# Patient Record
Sex: Male | Born: 1960 | Race: White | Hispanic: No | Marital: Single | Smoking: Former smoker
Health system: Southern US, Community
[De-identification: ages and names within clinical notes are randomized; demographics above are authoritative.]

## PROBLEM LIST (undated history)

## (undated) DIAGNOSIS — E119 Type 2 diabetes mellitus without complications: Secondary | ICD-10-CM

## (undated) DIAGNOSIS — Z972 Presence of dental prosthetic device (complete) (partial): Secondary | ICD-10-CM

## (undated) DIAGNOSIS — M1990 Unspecified osteoarthritis, unspecified site: Secondary | ICD-10-CM

## (undated) DIAGNOSIS — K219 Gastro-esophageal reflux disease without esophagitis: Secondary | ICD-10-CM

## (undated) DIAGNOSIS — M549 Dorsalgia, unspecified: Secondary | ICD-10-CM

## (undated) DIAGNOSIS — J45909 Unspecified asthma, uncomplicated: Secondary | ICD-10-CM

## (undated) HISTORY — DX: Gastro-esophageal reflux disease without esophagitis: K21.9

## (undated) HISTORY — DX: Unspecified osteoarthritis, unspecified site: M19.90

## (undated) HISTORY — DX: Unspecified asthma, uncomplicated: J45.909

## (undated) HISTORY — DX: Dorsalgia, unspecified: M54.9

---

## 2005-07-20 HISTORY — PX: TOTAL HIP ARTHROPLASTY: SHX124

## 2006-02-15 ENCOUNTER — Inpatient Hospital Stay: Payer: Self-pay | Admitting: Specialist

## 2006-12-20 ENCOUNTER — Ambulatory Visit: Payer: Self-pay | Admitting: Specialist

## 2007-01-03 ENCOUNTER — Inpatient Hospital Stay: Payer: Self-pay | Admitting: Specialist

## 2012-01-20 ENCOUNTER — Ambulatory Visit: Payer: Self-pay | Admitting: Internal Medicine

## 2012-10-11 ENCOUNTER — Ambulatory Visit: Payer: Self-pay | Admitting: Family Medicine

## 2015-01-10 ENCOUNTER — Ambulatory Visit: Payer: Self-pay | Admitting: Family Medicine

## 2015-01-10 ENCOUNTER — Other Ambulatory Visit: Payer: Self-pay | Admitting: Family Medicine

## 2015-01-15 ENCOUNTER — Encounter: Payer: Self-pay | Admitting: Family Medicine

## 2015-01-15 ENCOUNTER — Ambulatory Visit (INDEPENDENT_AMBULATORY_CARE_PROVIDER_SITE_OTHER): Payer: Commercial Indemnity | Admitting: Family Medicine

## 2015-01-15 VITALS — BP 107/68 | HR 73 | Resp 16 | Ht 70.25 in | Wt 263.6 lb

## 2015-01-15 DIAGNOSIS — I4949 Other premature depolarization: Secondary | ICD-10-CM | POA: Insufficient documentation

## 2015-01-15 DIAGNOSIS — J4520 Mild intermittent asthma, uncomplicated: Secondary | ICD-10-CM | POA: Insufficient documentation

## 2015-01-15 DIAGNOSIS — J449 Chronic obstructive pulmonary disease, unspecified: Secondary | ICD-10-CM | POA: Insufficient documentation

## 2015-01-15 DIAGNOSIS — E119 Type 2 diabetes mellitus without complications: Secondary | ICD-10-CM

## 2015-01-15 DIAGNOSIS — J45909 Unspecified asthma, uncomplicated: Secondary | ICD-10-CM | POA: Insufficient documentation

## 2015-01-15 DIAGNOSIS — F419 Anxiety disorder, unspecified: Secondary | ICD-10-CM

## 2015-01-15 DIAGNOSIS — N529 Male erectile dysfunction, unspecified: Secondary | ICD-10-CM | POA: Insufficient documentation

## 2015-01-15 DIAGNOSIS — K219 Gastro-esophageal reflux disease without esophagitis: Secondary | ICD-10-CM | POA: Insufficient documentation

## 2015-01-15 DIAGNOSIS — M1990 Unspecified osteoarthritis, unspecified site: Secondary | ICD-10-CM | POA: Insufficient documentation

## 2015-01-15 DIAGNOSIS — E1165 Type 2 diabetes mellitus with hyperglycemia: Secondary | ICD-10-CM | POA: Insufficient documentation

## 2015-01-15 DIAGNOSIS — E1169 Type 2 diabetes mellitus with other specified complication: Secondary | ICD-10-CM | POA: Insufficient documentation

## 2015-01-15 DIAGNOSIS — M549 Dorsalgia, unspecified: Secondary | ICD-10-CM | POA: Insufficient documentation

## 2015-01-15 DIAGNOSIS — E785 Hyperlipidemia, unspecified: Secondary | ICD-10-CM

## 2015-01-15 LAB — POCT GLYCOSYLATED HEMOGLOBIN (HGB A1C): Hemoglobin A1C: 7.6

## 2015-01-15 MED ORDER — METFORMIN HCL 1000 MG PO TABS: 1000.0000 mg | ORAL_TABLET | Freq: Two times a day (BID) | ORAL | Status: DC

## 2015-01-15 MED ORDER — GLUCOSE BLOOD VI STRP: ORAL_STRIP | Status: AC

## 2015-01-15 NOTE — Progress Notes (Signed)
Name: Shawn Gordon   MRN: [id number]    DOB: Nov 20, 1960   Date:01/15/2015       Progress Note  Subjective  Chief Complaint  Chief Complaint  Patient presents with  . Diabetes    Has not had strips to check BS. Last A1C 08/27/2014 7.8  . Foot Swelling    Left Foot more than Right.     HPI  For f/u of DM.  Not checking BSs.  Weight going down slowly.  Taking all meds..  Past Medical History  Diagnosis Date  . GERD (gastroesophageal reflux disease)   . Asthma   . Depression   . Arthritis   . Back pain   . Diabetes mellitus without complication   . Hyperlipidemia     History reviewed. No pertinent past surgical history.  Family History  Problem Relation Age of Onset  . Stroke Mother   . Stroke Father   . Cancer Father   . Hypertension Paternal Grandfather     History   Social History  . Marital Status: Divorced    Spouse Name: N/A  . Number of Children: N/A  . Years of Education: N/A   Occupational History  . Not on file.   Social History Main Topics  . Smoking status: Former Smoker -- 2.00 packs/day    Types: Cigarettes    Start date: 04/19/1978    Quit date: 02/26/2001  . Smokeless tobacco: Never Used  . Alcohol Use: No  . Drug Use: No  . Sexual Activity: Not on file   Other Topics Concern  . Not on file   Social History Narrative  . No narrative on file     Current outpatient prescriptions:  .  atorvastatin (LIPITOR) 10 MG tablet, Take by mouth., Disp: , Rfl:  .  FLUoxetine (PROZAC) 40 MG capsule, TAKE ONE CAPSULE BY MOUTH EVERY DAY, Disp: 30 capsule, Rfl: 5 .  glucose blood (TRUETEST TEST) test strip, Check Blood Sugar 1-2 Times daily., Disp: 100 each, Rfl: 12 .  lisinopril (PRINIVIL,ZESTRIL) 5 MG tablet, Take 2.5 mg by mouth daily., Disp: , Rfl:  .  loratadine (CLARITIN) 10 MG tablet, Take by mouth., Disp: , Rfl:  .  meloxicam (MOBIC) 15 MG tablet, Take 7.5 mg by mouth daily., Disp: , Rfl: 6 .  pantoprazole (PROTONIX) 40 MG tablet, Take  by mouth., Disp: , Rfl:  .  sildenafil (REVATIO) 20 MG tablet, Take by mouth., Disp: , Rfl:  .  amoxicillin (AMOXIL) 500 MG tablet, Take 500 mg by mouth daily at 6 (six) AM. TAKE 4 CAPS at [place]., Disp: , Rfl:  .  metFORMIN (GLUCOPHAGE) 1000 MG tablet, Take 1 tablet (1,000 mg total) by mouth 2 (two) times daily with a meal., Disp: 180 tablet, Rfl: 3  No Known Allergies   Review of Systems  Constitutional: Negative.  Negative for fever, chills, weight loss and malaise/fatigue.  HENT: Negative.   Eyes: Negative.  Negative for blurred vision and double vision.  Respiratory: Negative.  Negative for cough, sputum production, shortness of breath and wheezing.   Cardiovascular: Negative for chest pain, palpitations and orthopnea. Leg swelling: +/- swelling at end of day..  Gastrointestinal: Negative.  Negative for heartburn, nausea, vomiting, abdominal pain, diarrhea and blood in stool.  Genitourinary: Negative.  Negative for dysuria and frequency.  Musculoskeletal: Negative for myalgias and joint pain.       C/o some Achilles tendon tenderness and tightness, on and off.  Skin: Negative.  Negative for rash.  Neurological: Negative for dizziness, sensory change, focal weakness, weakness and headaches.  Endo/Heme/Allergies: Negative for polydipsia.  Psychiatric/Behavioral: Negative for depression. The patient is not nervous/anxious.       Objective  Filed Vitals:   01/15/15 1637  BP: 107/68  Pulse: 73  Resp: 16  Height: 5' 10.25" (1.784 m)  Weight: 263 lb 9.6 oz (119.568 kg)    Physical Exam  Constitutional: He is oriented to person, place, and time and well-developed, well-nourished, and in no distress. No distress.  HENT:  Head: Normocephalic and atraumatic.  Eyes: Conjunctivae are normal. Pupils are equal, round, and reactive to light. No scleral icterus.  Neck: Normal range of motion. Neck supple. No thyromegaly present.  Cardiovascular: Normal  rate, regular rhythm, normal heart sounds and intact distal pulses.  Exam reveals no gallop and no friction rub.   No murmur heard. Pulmonary/Chest: Effort normal and breath sounds normal. No respiratory distress. He has no wheezes. He has no rales.  Abdominal: Soft. Bowel sounds are normal. He exhibits no mass. There is no tenderness.  Musculoskeletal: He exhibits no edema.  Lymphadenopathy:    He has no cervical adenopathy.  Neurological: He is alert and oriented to person, place, and time.  Psychiatric: Mood, memory, affect and judgment normal.  Vitals reviewed.        Assessment & Plan  Problem List Items Addressed This Visit      Other   Acute anxiety    Other Visit Diagnoses    Type 2 diabetes mellitus without complication    -  Primary    Relevant Medications    atorvastatin (LIPITOR) 10 MG tablet    glucose blood (TRUETEST TEST) test strip    lisinopril (PRINIVIL,ZESTRIL) 5 MG tablet    metFORMIN (GLUCOPHAGE) 1000 MG tablet    Other Relevant Orders    POCT HgB A1C       Meds ordered this encounter  Medications  . atorvastatin (LIPITOR) 10 MG tablet    Sig: Take by mouth.  . DISCONTD: glucose blood (ONETOUCH VERIO) test strip    Sig: 2 strips daily.  Marland Kitchen DISCONTD: lisinopril (PRINIVIL,ZESTRIL) 2.5 MG tablet    Sig: Take 1 tablet by mouth.  . DISCONTD: lisinopril (PRINIVIL,ZESTRIL) 5 MG tablet    Sig: Take by mouth.  . loratadine (CLARITIN) 10 MG tablet    Sig: Take by mouth.  . DISCONTD: meloxicam (MOBIC) 15 MG tablet    Sig: Take by mouth.  . DISCONTD: metFORMIN (GLUCOPHAGE) 500 MG tablet    Sig: Take by mouth.  . pantoprazole (PROTONIX) 40 MG tablet    Sig: Take by mouth.  . sildenafil (REVATIO) 20 MG tablet    Sig: Take by mouth.  . meloxicam (MOBIC) 15 MG tablet    Sig: Take 7.5 mg by mouth daily.    Refill:  6  . DISCONTD: metFORMIN (GLUCOPHAGE) 500 MG tablet    Sig: TAKE 2 TABLETS EVERY MORNING AND 1 TABLET EACH EVENING    Refill:  12  .  glucose blood (TRUETEST TEST) test strip    Sig: Check Blood Sugar 1-2 Times daily.    Dispense:  100 each    Refill:  12  . lisinopril (PRINIVIL,ZESTRIL) 5 MG tablet    Sig: Take 2.5 mg by mouth daily.  Marland Kitchen amoxicillin (AMOXIL) 500 MG tablet    Sig: Take 500 mg by mouth daily at 6 (six) AM. TAKE 4 CAPS at [place]  PRIOR TO DENTAL PROCEDURE.  . metFORMIN (GLUCOPHAGE) 1000 MG tablet    Sig: Take 1 tablet (1,000 mg total) by mouth 2 (two) times daily with a meal.    Dispense:  180 tablet    Refill:  3   1. Type 2 diabetes mellitus without complication  - POCT HgB A1C - glucose blood (TRUETEST TEST) test strip; Check Blood Sugar 1-2 Times daily.  Dispense: 100 each; Refill: 12 - metFORMIN (GLUCOPHAGE) 1000 MG tablet; Take 1 tablet (1,000 mg total) by mouth 2 (two) times daily with a meal.  Dispense: 180 tablet; Refill: 3  2. Acute anxiety -Continue ProzaC FOR NOW.

## 2015-08-22 ENCOUNTER — Other Ambulatory Visit: Payer: Self-pay | Admitting: Family Medicine

## 2015-09-02 ENCOUNTER — Encounter: Payer: Self-pay | Admitting: Family Medicine

## 2015-09-02 ENCOUNTER — Ambulatory Visit (INDEPENDENT_AMBULATORY_CARE_PROVIDER_SITE_OTHER): Payer: Managed Care, Other (non HMO) | Admitting: Family Medicine

## 2015-09-02 VITALS — BP 109/72 | HR 73 | Temp 97.6°F | Resp 16 | Ht 70.5 in | Wt 268.4 lb

## 2015-09-02 DIAGNOSIS — Z23 Encounter for immunization: Secondary | ICD-10-CM | POA: Diagnosis not present

## 2015-09-02 DIAGNOSIS — E119 Type 2 diabetes mellitus without complications: Secondary | ICD-10-CM | POA: Diagnosis not present

## 2015-09-02 LAB — POCT GLYCOSYLATED HEMOGLOBIN (HGB A1C): Hemoglobin A1C: 7.2

## 2015-09-02 MED ORDER — SITAGLIPTIN PHOSPHATE 100 MG PO TABS: 100.0000 mg | ORAL_TABLET | Freq: Every day | ORAL | Status: DC

## 2015-09-02 NOTE — Progress Notes (Signed)
Name: Shawn Gordon   MRN: [id number]    DOB: 1961/01/14   Date:09/02/2015       Progress Note  Subjective  Chief Complaint  Chief Complaint  Patient presents with  . Diabetes    120-140     HPI Here for f/u of DM.  BSs run from 130-150 usually.  He is trying to eat better,  But still gaining weight.  Feels well.  No c/o.  No problem-specific assessment & plan notes found for this encounter.   Past Medical History  Diagnosis Date  . GERD (gastroesophageal reflux disease)   . Asthma   . Depression   . Arthritis   . Back pain   . Diabetes mellitus without complication ([place])   . Hyperlipidemia     History reviewed. No pertinent past surgical history.  Family History  Problem Relation Age of Onset  . Stroke Mother   . Stroke Father   . Cancer Father   . Hypertension Paternal Grandfather     Social History   Social History  . Marital Status: Divorced    Spouse Name: N/A  . Number of Children: N/A  . Years of Education: N/A   Occupational History  . Not on file.   Social History Main Topics  . Smoking status: Former Smoker -- 2.00 packs/day    Types: Cigarettes    Start date: 04/19/1978    Quit date: 02/26/2001  . Smokeless tobacco: Never Used  . Alcohol Use: No  . Drug Use: No  . Sexual Activity: Not on file   Other Topics Concern  . Not on file   Social History Narrative     Current outpatient prescriptions:  .  atorvastatin (LIPITOR) 10 MG tablet, Take by mouth., Disp: , Rfl:  .  FLUoxetine (PROZAC) 40 MG capsule, TAKE 1 CAPSULE BY MOUTH EVERY DAY, Disp: 30 capsule, Rfl: 5 .  glucose blood (TRUETEST TEST) test strip, Check Blood Sugar 1-2 Times daily., Disp: 100 each, Rfl: 12 .  lisinopril (PRINIVIL,ZESTRIL) 2.5 MG tablet, Take 2.5 mg by mouth daily., Disp: , Rfl: 12 .  loratadine (CLARITIN) 10 MG tablet, Take by mouth., Disp: , Rfl:  .  meloxicam (MOBIC) 15 MG tablet, Take 7.5 mg by mouth daily. 1/2 TAB, Disp: , Rfl: 6 .  metFORMIN  (GLUCOPHAGE) 1000 MG tablet, Take 1 tablet (1,000 mg total) by mouth 2 (two) times daily with a meal., Disp: 180 tablet, Rfl: 3 .  pantoprazole (PROTONIX) 40 MG tablet, Take by mouth., Disp: , Rfl:  .  sitaGLIPtin (JANUVIA) 100 MG tablet, Take 1 tablet (100 mg total) by mouth daily., Disp: 30 tablet, Rfl: 6  Not on File   Review of Systems  Constitutional: Negative for fever, chills, weight loss and malaise/fatigue.  HENT: Negative for hearing loss.   Eyes: Negative for blurred vision and double vision.  Respiratory: Negative for cough, shortness of breath and wheezing.   Cardiovascular: Negative for chest pain, palpitations and leg swelling.  Gastrointestinal: Positive for heartburn. Negative for nausea, vomiting, abdominal pain, diarrhea and blood in stool.  Genitourinary: Negative for dysuria, urgency and frequency.  Musculoskeletal: Negative for myalgias and joint pain.  Skin: Negative for rash.  Neurological: Negative for dizziness, tremors, weakness and headaches.      Objective  Filed Vitals:   09/02/15 1035  BP: 109/72  Pulse: 73  Temp: 97.6 F (36.4 C)  Resp: 16  Height: 5' 10.5" (1.791 m)  Weight: 268 lb 6.4 oz (121.745 kg)  Physical Exam  Constitutional: He is well-developed, well-nourished, and in no distress. No distress.  HENT:  Head: Normocephalic and atraumatic.  Eyes: Conjunctivae are normal. Pupils are equal, round, and reactive to light. No scleral icterus.  Neck: Normal range of motion. Neck supple. Carotid bruit is not present. No thyromegaly present.  Cardiovascular: Normal rate, regular rhythm and normal heart sounds.  Exam reveals no gallop and no friction rub.   No murmur heard. Pulmonary/Chest: Effort normal and breath sounds normal. No respiratory distress. He has no wheezes. He exhibits no tenderness.  Abdominal: Bowel sounds are normal. He exhibits no distension and no mass. There is no tenderness.  Musculoskeletal: He exhibits no edema.   Lymphadenopathy:    He has no cervical adenopathy.  Neurological: He is alert.  Vitals reviewed.      No results found for this or any previous visit (from the past 2160 hour(s)).   Assessment & Plan  Problem List Items Addressed This Visit    None    Visit Diagnoses    Type 2 diabetes mellitus without complication, unspecified long term insulin use status (HCC)    -  Primary    Relevant Medications    lisinopril (PRINIVIL,ZESTRIL) 2.5 MG tablet    sitaGLIPtin (JANUVIA) 100 MG tablet    Other Relevant Orders    POCT HgB A1C    Need for influenza vaccination        Relevant Orders    Flu Vaccine QUAD 36+ mos PF IM (Fluarix & Fluzone Quad PF) (Completed)       Meds ordered this encounter  Medications  . lisinopril (PRINIVIL,ZESTRIL) 2.5 MG tablet    Sig: Take 2.5 mg by mouth daily.    Refill:  12  . sitaGLIPtin (JANUVIA) 100 MG tablet    Sig: Take 1 tablet (100 mg total) by mouth daily.    Dispense:  30 tablet    Refill:  6   1. Type 2 diabetes mellitus without complication, unspecified long term insulin use status (HCC) Cont metformin and Lisinopril - POCT HgB A1C-7.2 - sitaGLIPtin (JANUVIA) 100 MG tablet; Take 1 tablet (100 mg total) by mouth daily.  Dispense: 30 tablet; Refill: 6  2. Need for influenza vaccination  - Flu Vaccine QUAD 36+ mos PF IM (Fluarix & Fluzone Quad PF)

## 2015-09-23 ENCOUNTER — Other Ambulatory Visit: Payer: Self-pay | Admitting: Family Medicine

## 2015-10-24 ENCOUNTER — Other Ambulatory Visit: Payer: Self-pay | Admitting: Family Medicine

## 2015-12-09 ENCOUNTER — Ambulatory Visit: Payer: Commercial Indemnity | Admitting: Family Medicine

## 2016-01-14 ENCOUNTER — Encounter: Payer: Self-pay | Admitting: Family Medicine

## 2016-01-14 ENCOUNTER — Other Ambulatory Visit: Payer: Self-pay | Admitting: Family Medicine

## 2016-01-14 ENCOUNTER — Ambulatory Visit (INDEPENDENT_AMBULATORY_CARE_PROVIDER_SITE_OTHER): Payer: Managed Care, Other (non HMO) | Admitting: Family Medicine

## 2016-01-14 VITALS — BP 119/75 | HR 75 | Temp 97.5°F | Resp 16 | Ht 70.5 in | Wt 266.0 lb

## 2016-01-14 DIAGNOSIS — E669 Obesity, unspecified: Secondary | ICD-10-CM | POA: Diagnosis not present

## 2016-01-14 DIAGNOSIS — K219 Gastro-esophageal reflux disease without esophagitis: Secondary | ICD-10-CM

## 2016-01-14 DIAGNOSIS — E66811 Obesity, class 1: Secondary | ICD-10-CM | POA: Insufficient documentation

## 2016-01-14 DIAGNOSIS — E119 Type 2 diabetes mellitus without complications: Secondary | ICD-10-CM | POA: Diagnosis not present

## 2016-01-14 DIAGNOSIS — E7800 Pure hypercholesterolemia, unspecified: Secondary | ICD-10-CM | POA: Diagnosis not present

## 2016-01-14 DIAGNOSIS — F329 Major depressive disorder, single episode, unspecified: Secondary | ICD-10-CM | POA: Diagnosis not present

## 2016-01-14 DIAGNOSIS — F32A Depression, unspecified: Secondary | ICD-10-CM

## 2016-01-14 DIAGNOSIS — E1169 Type 2 diabetes mellitus with other specified complication: Secondary | ICD-10-CM

## 2016-01-14 LAB — CBC WITH DIFFERENTIAL/PLATELET
BASOS PCT: 0 %
Basophils Absolute: 0 cells/uL (ref 0–200)
EOS PCT: 6 %
Eosinophils Absolute: 426 cells/uL (ref 15–500)
HEMATOCRIT: 39.5 % (ref 38.5–50.0)
Hemoglobin: 13 g/dL — ABNORMAL LOW (ref 13.2–17.1)
LYMPHS PCT: 16 %
Lymphs Abs: 1136 cells/uL (ref 850–3900)
MCH: 27.9 pg (ref 27.0–33.0)
MCHC: 32.9 g/dL (ref 32.0–36.0)
MCV: 84.8 fL (ref 80.0–100.0)
MONO ABS: 497 {cells}/uL (ref 200–950)
MPV: 10.4 fL (ref 7.5–12.5)
Monocytes Relative: 7 %
Neutro Abs: 5041 cells/uL (ref 1500–7800)
Neutrophils Relative %: 71 %
PLATELETS: 311 10*3/uL (ref 140–400)
RBC: 4.66 MIL/uL (ref 4.20–5.80)
RDW: 13.6 % (ref 11.0–15.0)
WBC: 7.1 10*3/uL (ref 3.8–10.8)

## 2016-01-14 LAB — POCT GLYCOSYLATED HEMOGLOBIN (HGB A1C)

## 2016-01-14 MED ORDER — SAXAGLIPTIN HCL 5 MG PO TABS: 5.0000 mg | ORAL_TABLET | Freq: Every day | ORAL | Status: DC

## 2016-01-14 MED ORDER — LINAGLIPTIN 5 MG PO TABS: 5.0000 mg | ORAL_TABLET | Freq: Every day | ORAL | Status: DC

## 2016-01-14 NOTE — Progress Notes (Signed)
Name: Shawn Gordon   MRN: [id number]    DOB: July 10, 1961   Date:01/14/2016       Progress Note  Subjective  Chief Complaint  Chief Complaint  Patient presents with  . Diabetes    HPI Here for f/u of DM .  He did not start Januvia at last visit because insurance denied.  He did not inform me of this.  Still just taking Metformin.  He has lost minimal weight.  Depression is doing v ery well overall.  GERD doing great.  Hed is not checking his blood sugars at home. No problem-specific assessment & plan notes found for this encounter.   Past Medical History  Diagnosis Date  . GERD (gastroesophageal reflux disease)   . Asthma   . Depression   . Arthritis   . Back pain   . Diabetes mellitus without complication ([place])   . Hyperlipidemia     History reviewed. No pertinent past surgical history.  Family History  Problem Relation Age of Onset  . Stroke Mother   . Stroke Father   . Cancer Father   . Hypertension Paternal Grandfather     Social History   Social History  . Marital Status: Divorced    Spouse Name: N/A  . Number of Children: N/A  . Years of Education: N/A   Occupational History  . Not on file.   Social History Main Topics  . Smoking status: Former Smoker -- 2.00 packs/day    Types: Cigarettes    Start date: 04/19/1978    Quit date: 02/26/2001  . Smokeless tobacco: Never Used  . Alcohol Use: No  . Drug Use: No  . Sexual Activity: Not on file   Other Topics Concern  . Not on file   Social History Narrative     Current outpatient prescriptions:  .  atorvastatin (LIPITOR) 10 MG tablet, TAKE 1 TABLET EACH NIGHT AT BEDTIME, Disp: 30 tablet, Rfl: 11 .  FLUoxetine (PROZAC) 40 MG capsule, TAKE 1 CAPSULE BY MOUTH EVERY DAY, Disp: 30 capsule, Rfl: 5 .  glucose blood (TRUETEST TEST) test strip, Check Blood Sugar 1-2 Times daily., Disp: 100 each, Rfl: 12 .  lisinopril (PRINIVIL,ZESTRIL) 2.5 MG tablet, TAKE 1 TABLET ONCE DAILY, Disp: 30 tablet, Rfl: 11 .   loratadine (CLARITIN) 10 MG tablet, Take 10 mg by mouth daily as needed. , Disp: , Rfl:  .  metFORMIN (GLUCOPHAGE) 1000 MG tablet, Take 1 tablet (1,000 mg total) by mouth 2 (two) times daily with a meal., Disp: 180 tablet, Rfl: 3 .  pantoprazole (PROTONIX) 40 MG tablet, TAKE 1 TABLET TWICE DAILY, Disp: 60 tablet, Rfl: 11 .  linagliptin (TRADJENTA) 5 MG TABS tablet, Take 1 tablet (5 mg total) by mouth daily., Disp: 30 tablet, Rfl: 6 .  meloxicam (MOBIC) 15 MG tablet, Take 7.5 mg by mouth as needed. Reported on 01/14/2016, Disp: , Rfl: 6  Not on File   Review of Systems  Constitutional: Negative for fever, chills, weight loss and malaise/fatigue.  HENT: Negative for hearing loss.   Eyes: Negative for blurred vision and double vision.  Respiratory: Negative for cough, shortness of breath and wheezing.   Cardiovascular: Negative for chest pain, palpitations and leg swelling.  Gastrointestinal: Negative for heartburn, abdominal pain and blood in stool.  Genitourinary: Negative for dysuria, urgency and frequency.  Skin: Negative for rash.  Neurological: Negative for dizziness, tremors, weakness and headaches.  Psychiatric/Behavioral: Negative for depression. The patient is not nervous/anxious.  Objective  Filed Vitals:   01/14/16 1602  BP: 119/75  Pulse: 75  Temp: 97.5 F (36.4 C)  TempSrc: Oral  Resp: 16  Height: 5' 10.5" (1.791 m)  Weight: 266 lb (120.657 kg)    Physical Exam  Constitutional: He is oriented to person, place, and time and well-developed, well-nourished, and in no distress. No distress.  HENT:  Head: Normocephalic and atraumatic.  Eyes: Conjunctivae and EOM are normal. Pupils are equal, round, and reactive to light. No scleral icterus.  Neck: Normal range of motion. Neck supple. Carotid bruit is not present. No thyromegaly present.  Cardiovascular: Normal rate, regular rhythm and normal heart sounds.  Exam reveals no gallop and no friction rub.   No murmur  heard. Pulmonary/Chest: Effort normal and breath sounds normal. No respiratory distress. He has no wheezes. He has no rales.  Abdominal: Soft. Bowel sounds are normal. He exhibits no distension, no abdominal bruit and no mass. There is no tenderness.  Musculoskeletal: He exhibits no edema.  Lymphadenopathy:    He has no cervical adenopathy.  Neurological: He is alert and oriented to person, place, and time.  Vitals reviewed.      Recent Results (from the past 2160 hour(s))  POCT HgB A1C     Status: Abnormal   Collection Time: 01/14/16  4:15 PM  Result Value Ref Range   Hemoglobin A1C 8.8%      Assessment & Plan  Problem List Items Addressed This Visit      Digestive   Gastric reflux   Relevant Orders   CBC with Differential     Endocrine   Diabetes mellitus type 2 in obese (HCC)   Relevant Medications   linagliptin (TRADJENTA) 5 MG TABS tablet     Other   Clinical depression   Hypercholesteremia   Relevant Orders   Lipid Profile   Obesity   Relevant Medications   linagliptin (TRADJENTA) 5 MG TABS tablet   Other Relevant Orders   TSH    Other Visit Diagnoses    Type 2 diabetes mellitus without complication, without long-term current use of insulin (HCC)    -  Primary    Relevant Medications    linagliptin (TRADJENTA) 5 MG TABS tablet    Other Relevant Orders    POCT HgB A1C (Completed)    COMPLETE METABOLIC PANEL WITH GFR       Meds ordered this encounter  Medications  . linagliptin (TRADJENTA) 5 MG TABS tablet    Sig: Take 1 tablet (5 mg total) by mouth daily.    Dispense:  30 tablet    Refill:  6   1. Type 2 diabetes mellitus without complication, without long-term current use of insulin (HCC)  - POCT HgB A1C-8.8 - COMPLETE METABOLIC PANEL WITH GFR - linagliptin (TRADJENTA) 5 MG TABS tablet; Take 1 tablet (5 mg total) by mouth daily.  Dispense: 30 tablet; Refill: 6 Cont metformin Cont Lisinopril 2. Diabetes mellitus type 2 in obese ([place])   3.  Gastric reflux Cont PPI prn - CBC with Differential  4. Clinical depression Cont Prozac  5. Hypercholesteremia Cont Lipitor - Lipid Profile  6. Obesity  - TSH

## 2016-01-15 ENCOUNTER — Encounter: Payer: Self-pay | Admitting: *Deleted

## 2016-01-15 LAB — COMPLETE METABOLIC PANEL WITH GFR
ALT: 29 U/L (ref 9–46)
AST: 18 U/L (ref 10–35)
Albumin: 3.9 g/dL (ref 3.6–5.1)
Alkaline Phosphatase: 85 U/L (ref 40–115)
BUN: 17 mg/dL (ref 7–25)
CHLORIDE: 98 mmol/L (ref 98–110)
CO2: 23 mmol/L (ref 20–31)
CREATININE: 0.69 mg/dL — AB (ref 0.70–1.33)
Calcium: 8.8 mg/dL (ref 8.6–10.3)
GFR, Est Non African American: >89 mL/min (ref >=60)
GLUCOSE: 200 mg/dL — AB (ref 65–99)
Potassium: 4.4 mmol/L (ref 3.5–5.3)
SODIUM: 137 mmol/L (ref 135–146)
Total Bilirubin: 0.5 mg/dL (ref 0.2–1.2)
Total Protein: 6.9 g/dL (ref 6.1–8.1)

## 2016-01-15 LAB — LIPID PANEL
CHOLESTEROL: 139 mg/dL (ref 125–200)
HDL: 42 mg/dL (ref >=40)
LDL CALC: 66 mg/dL (ref <130)
TRIGLYCERIDES: 154 mg/dL — AB (ref <150)
Total CHOL/HDL Ratio: 3.3 Ratio (ref <=5.0)
VLDL: 31 mg/dL — ABNORMAL HIGH (ref <30)

## 2016-01-15 LAB — TSH: TSH: 1.04 mIU/L (ref 0.40–4.50)

## 2016-01-20 ENCOUNTER — Other Ambulatory Visit: Payer: Self-pay | Admitting: Family Medicine

## 2016-02-17 ENCOUNTER — Other Ambulatory Visit: Payer: Self-pay | Admitting: Family Medicine

## 2016-04-28 ENCOUNTER — Ambulatory Visit: Payer: Managed Care, Other (non HMO) | Admitting: Family Medicine

## 2016-09-26 ENCOUNTER — Other Ambulatory Visit: Payer: Self-pay | Admitting: Family Medicine

## 2016-11-23 ENCOUNTER — Other Ambulatory Visit: Payer: Self-pay

## 2016-11-23 DIAGNOSIS — E1169 Type 2 diabetes mellitus with other specified complication: Secondary | ICD-10-CM

## 2016-11-23 DIAGNOSIS — F329 Major depressive disorder, single episode, unspecified: Secondary | ICD-10-CM

## 2016-11-23 DIAGNOSIS — F32A Depression, unspecified: Secondary | ICD-10-CM

## 2016-11-23 DIAGNOSIS — E669 Obesity, unspecified: Secondary | ICD-10-CM

## 2016-11-23 MED ORDER — FLUOXETINE HCL 40 MG PO CAPS: 40.0000 mg | ORAL_CAPSULE | Freq: Every day | ORAL | 0 refills | Status: DC

## 2016-11-23 MED ORDER — LISINOPRIL 2.5 MG PO TABS: 2.5000 mg | ORAL_TABLET | Freq: Every day | ORAL | 0 refills | Status: DC

## 2016-11-23 NOTE — Telephone Encounter (Signed)
Pharmacy requesting 90 day supply refill Last ov  01/14/16 Last filled 09/28/16

## 2016-11-24 NOTE — Telephone Encounter (Signed)
LMTCB

## 2017-01-22 ENCOUNTER — Other Ambulatory Visit: Payer: Self-pay

## 2017-01-22 MED ORDER — METFORMIN HCL 1000 MG PO TABS: ORAL_TABLET | ORAL | 0 refills | Status: DC

## 2017-02-24 ENCOUNTER — Ambulatory Visit (INDEPENDENT_AMBULATORY_CARE_PROVIDER_SITE_OTHER): Payer: Managed Care, Other (non HMO) | Admitting: Family Medicine

## 2017-02-24 ENCOUNTER — Other Ambulatory Visit: Payer: Self-pay | Admitting: Family Medicine

## 2017-02-24 ENCOUNTER — Encounter: Payer: Self-pay | Admitting: Family Medicine

## 2017-02-24 VITALS — BP 115/72 | HR 78 | Temp 98.2°F | Resp 16 | Ht 70.0 in | Wt 249.4 lb

## 2017-02-24 DIAGNOSIS — E1169 Type 2 diabetes mellitus with other specified complication: Secondary | ICD-10-CM | POA: Diagnosis not present

## 2017-02-24 DIAGNOSIS — F3341 Major depressive disorder, recurrent, in partial remission: Secondary | ICD-10-CM | POA: Diagnosis not present

## 2017-02-24 DIAGNOSIS — E785 Hyperlipidemia, unspecified: Secondary | ICD-10-CM

## 2017-02-24 DIAGNOSIS — Z125 Encounter for screening for malignant neoplasm of prostate: Secondary | ICD-10-CM

## 2017-02-24 DIAGNOSIS — K219 Gastro-esophageal reflux disease without esophagitis: Secondary | ICD-10-CM

## 2017-02-24 DIAGNOSIS — E119 Type 2 diabetes mellitus without complications: Secondary | ICD-10-CM

## 2017-02-24 DIAGNOSIS — Z0000 Encounter for general adult medical examination without abnormal findings: Secondary | ICD-10-CM

## 2017-02-24 DIAGNOSIS — D649 Anemia, unspecified: Secondary | ICD-10-CM

## 2017-02-24 LAB — POCT GLYCOSYLATED HEMOGLOBIN (HGB A1C): Hemoglobin A1C: 7.1 — AB (ref ?–5.7)

## 2017-02-24 MED ORDER — METFORMIN HCL 1000 MG PO TABS: ORAL_TABLET | ORAL | 3 refills | Status: DC

## 2017-02-24 MED ORDER — FLUOXETINE HCL 40 MG PO CAPS: 40.0000 mg | ORAL_CAPSULE | Freq: Every day | ORAL | 3 refills | Status: DC

## 2017-02-24 MED ORDER — ATORVASTATIN CALCIUM 10 MG PO TABS: 10.0000 mg | ORAL_TABLET | Freq: Every day | ORAL | 3 refills | Status: DC

## 2017-02-24 MED ORDER — PANTOPRAZOLE SODIUM 40 MG PO TBEC: 40.0000 mg | DELAYED_RELEASE_TABLET | Freq: Two times a day (BID) | ORAL | 3 refills | Status: DC

## 2017-02-24 MED ORDER — LISINOPRIL 2.5 MG PO TABS: 2.5000 mg | ORAL_TABLET | Freq: Every day | ORAL | 3 refills | Status: DC

## 2017-02-24 NOTE — Assessment & Plan Note (Signed)
Well-controlled DM with A1c 7.1 (stable from 7.0 1 year ago) No known complications or hypoglycemia.  Plan:  1. Continue current therapy - Metformin 1000mg  BID 2. Encourage improved lifestyle - low carb, low sugar diet, reduce portion size, consider adding some regular exercise 3. Check CBG occasionally, bring log to next visit for review 4. Continue ACEi, Statin 5. DM Foot exam done today / Advised to schedule DM ophtho exam, send record 6. Follow-up 3 months annual phys + labs

## 2017-02-24 NOTE — Assessment & Plan Note (Signed)
Stable depression, controlled on SSRI No prior other meds Refill Fluoxetine 40

## 2017-02-24 NOTE — Progress Notes (Signed)
Subjective:    Patient ID: Shawn Gordon, male    DOB: Mar 11, 1961, 56 y.o.   MRN: [id number]  Shawn Gordon is a 55 y.o. male presenting on 02/24/2017 for Diabetes  Note that patient encounter completed without access to [facility], only back-up patient chart information. This note was written after visit.  HPI   He requests that all meds be refilled today.  CHRONIC DM, Type 2: Reports no concerns. He anticipates A1c much improved. CBGs: Avg 120s, Low >100, High < 200. Checks CBGs 2-3x weekly Meds: Metformin 1000mg  BID - Med list has Onglyza but not on this med, he is unsure about this one Reports good compliance. Tolerating well w/o side-effects Currently on ACEi Lifestyle: - Diet (Dramatically improved low carb DM diet, no bread)  - Exercise (Active at work and does a lot of working but no regular exercise) - Weight loss about 20 lbs Denies hypoglycemia, polyuria, visual changes, numbness or tingling.  GERD Reports long history of GERD >4 years of significant symptoms initially before clear diagnosis, had concerns with dyspnea and acid reflux affecting his breathing. He had cardiac and pulmonary evaluation, ultimately determined GERD was cause and treated with PPI. He has not established with GI and has never had EGD. He is not interested, and would like to continue high dose PPI. He has strong family history of similar GERD. He remains on PPI BID, was on Protonix 40 BID for >2-4 years with good results, recently has run out due to rx, and now taking Omeprazole 20mg  BID and Zantac 150mg  BID with less effective results - He has improved GERD with changing his diet, see above - Denies abdominal pain, heartburn, nausea, vomiting, dark stool, blood in stool  DEPRESSION - Reports chronic history of depression, multiple life stressors. He has been stable on Fluoxetine 40mg  daily, in past tried 20mg  but not effective enough. No new complaints. Due for refill.  Depression screen [facility]  2/9 02/24/2017 09/02/2015 01/15/2015  Decreased Interest 0 0 0  Down, Depressed, Hopeless 0 0 0  PHQ - 2 Score 0 0 0  Altered sleeping 0 - -  Tired, decreased energy 1 - -  Change in appetite 1 - -  Feeling bad or failure about yourself  0 - -  Trouble concentrating 1 - -  Moving slowly or fidgety/restless 1 - -  Suicidal thoughts 1 - -  PHQ-9 Score 5 - -   [place]-Suicide Severity Rating Scale 1) Have you wished you were dead or wished you could go to sleep and not wake up? - No  2) Have you had any actual thoughts of killing yourself? - No  Skip questions 3,4, 5  6) Have you ever done anything, started to do anything, or prepared to do anything to end your life? - No   Social History  Substance Use Topics  . Smoking status: Former Smoker    Packs/day: 2.00    Types: Cigarettes    Start date: 04/19/1978    Quit date: 02/26/2001  . Smokeless tobacco: Never Used  . Alcohol use No    Review of Systems Per HPI unless specifically indicated above     Objective:    BP 115/72   Pulse 78   Temp 98.2 F (36.8 C)   Resp 16   Ht 5\' 10"  (1.778 m)   Wt 249 lb 6.4 oz (113.1 kg)   BMI 35.79 kg/m   Wt Readings from Last 3 Encounters:  02/24/17 249  lb 6.4 oz (113.1 kg)  01/14/16 266 lb (120.7 kg)  09/02/15 268 lb 6.4 oz (121.7 kg)    Physical Exam  Constitutional: He is oriented to person, place, and time. He appears well-developed and well-nourished. No distress.  Well-appearing, comfortable, cooperative, obese  HENT:  Head: Normocephalic and atraumatic.  Mouth/Throat: Oropharynx is clear and moist.  Eyes: Conjunctivae are normal. Right eye exhibits no discharge. Left eye exhibits no discharge.  Neck: Normal range of motion. Neck supple. No thyromegaly present.  No carotid bruits  Cardiovascular: Normal rate, regular rhythm, normal heart sounds and intact distal pulses.   No murmur heard. Pulmonary/Chest: Effort normal and breath sounds normal. No respiratory distress. He  has no wheezes. He has no rales.  Musculoskeletal: Normal range of motion. He exhibits no edema.  Lymphadenopathy:    He has no cervical adenopathy.  Neurological: He is alert and oriented to person, place, and time.  Skin: Skin is warm and dry. No rash noted. He is not diaphoretic. No erythema.  Psychiatric: He has a normal mood and affect. His behavior is normal.  Well groomed, good eye contact, normal speech and thoughts  Nursing note and vitals reviewed.    Diabetic Foot Exam - Simple   Simple Foot Form Diabetic Foot exam was performed with the following findings:  Yes 02/24/2017 11:01 PM  Visual Inspection No deformities, no ulcerations, no other skin breakdown bilaterally:  Yes Sensation Testing Intact to touch and monofilament testing bilaterally:  Yes Pulse Check Posterior Tibialis and Dorsalis pulse intact bilaterally:  Yes Comments     Recent Labs  02/24/17 2302  HGBA1C 7.1*    Results for orders placed or performed in visit on 02/24/17  POCT glycosylated hemoglobin (Hb A1C)  Result Value Ref Range   Hemoglobin A1C 7.1 (A) 5.7      Assessment & Plan:   Problem List Items Addressed This Visit    Hyperlipidemia associated with type 2 diabetes mellitus ([place])   Relevant Medications   atorvastatin (LIPITOR) 10 MG tablet   lisinopril (PRINIVIL,ZESTRIL) 2.5 MG tablet   metFORMIN (GLUCOPHAGE) 1000 MG tablet   GERD (gastroesophageal reflux disease)    Stable chronic GERD controlled on high dose BID PPI No known complication No prior GI and EGD  Plan: 1. Discussed chronic management of GERD and PPI, also long-term potential complications/side effects. 2. Agreed to refill Protonix 40mg  BID for now, patient declines taper at this time, he will DC Omeprazole and Zantac, he understands risk of not checking EGD or other imaging at this time and will consider further eval if any worsening symptoms or in future      Relevant Medications   pantoprazole (PROTONIX) 40 MG  tablet   Controlled diabetes mellitus type II without complication (HCC) - Primary    Well-controlled DM with A1c 7.1 (stable from 7.0 1 year ago) No known complications or hypoglycemia.  Plan:  1. Continue current therapy - Metformin 1000mg  BID 2. Encourage improved lifestyle - low carb, low sugar diet, reduce portion size, consider adding some regular exercise 3. Check CBG occasionally, bring log to next visit for review 4. Continue ACEi, Statin 5. DM Foot exam done today / Advised to schedule DM ophtho exam, send record 6. Follow-up 3 months annual phys + labs      Relevant Medications   atorvastatin (LIPITOR) 10 MG tablet   lisinopril (PRINIVIL,ZESTRIL) 2.5 MG tablet   metFORMIN (GLUCOPHAGE) 1000 MG tablet   Other Relevant Orders   POCT glycosylated  hemoglobin (Hb A1C) (Completed)   Clinical depression    Stable depression, controlled on SSRI No prior other meds Refill Fluoxetine 40      Relevant Medications   FLUoxetine (PROZAC) 40 MG capsule      Meds ordered this encounter  Medications  . atorvastatin (LIPITOR) 10 MG tablet    Sig: Take 1 tablet (10 mg total) by mouth at bedtime.    Dispense:  90 tablet    Refill:  3  . FLUoxetine (PROZAC) 40 MG capsule    Sig: Take 1 capsule (40 mg total) by mouth daily.    Dispense:  90 capsule    Refill:  3  . lisinopril (PRINIVIL,ZESTRIL) 2.5 MG tablet    Sig: Take 1 tablet (2.5 mg total) by mouth daily.    Dispense:  90 tablet    Refill:  3  . metFORMIN (GLUCOPHAGE) 1000 MG tablet    Sig: TAKE 1 TABLET (1,000 MG TOTAL) BY MOUTH 2 (TWO) TIMES DAILY WITH A MEAL.    Dispense:  180 tablet    Refill:  3  . pantoprazole (PROTONIX) 40 MG tablet    Sig: Take 1 tablet (40 mg total) by mouth 2 (two) times daily.    Dispense:  180 tablet    Refill:  3    Follow up plan: Return in about 3 months (around 05/27/2017) for Annual Physical.  [name], DO [facility] 02/24/2017,  11:18 PM

## 2017-02-24 NOTE — Assessment & Plan Note (Signed)
Stable chronic GERD controlled on high dose BID PPI No known complication No prior GI and EGD  Plan: 1. Discussed chronic management of GERD and PPI, also long-term potential complications/side effects. 2. Agreed to refill Protonix 40mg  BID for now, patient declines taper at this time, he will DC Omeprazole and Zantac, he understands risk of not checking EGD or other imaging at this time and will consider further eval if any worsening symptoms or in future

## 2017-02-24 NOTE — Patient Instructions (Addendum)
Thank you for coming to the clinic today.  Please schedule a Follow-up Appointment to: Return in about 3 months (around 05/27/2017) for Annual Physical.  If you have any other questions or concerns, please feel free to call the clinic or send a message through [place]. You may also schedule an earlier appointment if necessary.  Additionally, you may be receiving a survey about your experience at our clinic within a few days to 1 week by e-mail or mail. We value your feedback.  [name], DO [place]

## 2017-08-26 ENCOUNTER — Encounter: Payer: Self-pay | Admitting: Nurse Practitioner

## 2017-08-26 ENCOUNTER — Other Ambulatory Visit: Payer: Self-pay

## 2017-08-26 ENCOUNTER — Ambulatory Visit (INDEPENDENT_AMBULATORY_CARE_PROVIDER_SITE_OTHER): Payer: Managed Care, Other (non HMO) | Admitting: Nurse Practitioner

## 2017-08-26 VITALS — BP 123/70 | HR 72 | Temp 98.4°F | Ht 70.0 in | Wt 257.6 lb

## 2017-08-26 DIAGNOSIS — H6983 Other specified disorders of Eustachian tube, bilateral: Secondary | ICD-10-CM | POA: Diagnosis not present

## 2017-08-26 DIAGNOSIS — R42 Dizziness and giddiness: Secondary | ICD-10-CM

## 2017-08-26 DIAGNOSIS — Z23 Encounter for immunization: Secondary | ICD-10-CM

## 2017-08-26 MED ORDER — PREDNISONE 10 MG PO TABS: ORAL_TABLET | ORAL | 0 refills | Status: DC

## 2017-08-26 MED ORDER — MECLIZINE HCL 12.5 MG PO TABS: 6.2500 mg | ORAL_TABLET | Freq: Three times a day (TID) | ORAL | 0 refills | Status: DC | PRN

## 2017-08-26 MED ORDER — FLUTICASONE PROPIONATE 50 MCG/ACT NA SUSP: 2.0000 | Freq: Every day | NASAL | 6 refills | Status: DC

## 2017-08-26 NOTE — Patient Instructions (Addendum)
Nic, Thank you for coming in to clinic today.  1. Take prednisone taper 10 mg tablets Day 1 (Today): Take 6 pills at one time Day 2: Take 6 pills  Day 3: Take 5 pills Day 4: Take 4 pills Day 5: Take 3 pills Day 6: Take 2 pills Day 7: Take 1 pill then stop.  2. START flonase 1-2 sprays in each nostril once daily for eustachian tube dysfunction.  Use for 4 weeks.  3. START meclizine 1/2-1 tablet for dizziness three times daily as needed.   Please schedule a follow-up appointment with Cassell Smiles, AGNP. Return 7-10 days if symptoms worsen or fail to improve.  If you have any other questions or concerns, please feel free to call the clinic or send a message through [place]. You may also schedule an earlier appointment if necessary.  You will receive a survey after today's visit either digitally by e-mail or paper by [employer]. Your experiences and feedback matter to [place].  Please respond so we know how we are doing as we provide care for you.   Cassell Smiles, DNP, AGNP-BC Adult Gerontology Nurse Practitioner [facility], [facility]   Eustachian Tube Dysfunction The eustachian tube connects the middle ear to the back of the nose. It regulates air pressure in the middle ear by allowing air to move between the ear and nose. It also helps to drain fluid from the middle ear space. When the eustachian tube does not function properly, air pressure, fluid, or both can build up in the middle ear. Eustachian tube dysfunction can affect one or both ears. What are the causes? This condition happens when the eustachian tube becomes blocked or cannot open normally. This may result from:  Ear infections.  Colds and other upper respiratory infections.  Allergies.  Irritation, such as from cigarette smoke or acid from the stomach coming up into the esophagus (gastroesophageal reflux).  Sudden changes in air pressure, such as from descending in an airplane.  Abnormal growths in  the nose or throat, such as nasal polyps, tumors, or enlarged tissue at the back of the throat (adenoids).  What increases the risk? This condition may be more likely to develop in people who smoke and people who are overweight. Eustachian tube dysfunction may also be more likely to develop in children, especially children who have:  Certain birth defects of the mouth, such as cleft palate.  Large tonsils and adenoids.  What are the signs or symptoms? Symptoms of this condition may include:  A feeling of fullness in the ear.  Ear pain.  Clicking or popping noises in the ear.  Ringing in the ear.  Hearing loss.  Loss of balance.  Symptoms may get worse when the air pressure around you changes, such as when you travel to an area of high elevation or fly on an airplane. How is this diagnosed? This condition may be diagnosed based on:  Your symptoms.  A physical exam of your ear, nose, and throat.  Tests, such as those that measure: ? The movement of your eardrum (tympanogram). ? Your hearing (audiometry).  How is this treated? Treatment depends on the cause and severity of your condition. If your symptoms are mild, you may be able to relieve your symptoms by moving air into ("popping") your ears. If you have symptoms of fluid in your ears, treatment may include:  Decongestants.  Antihistamines.  Nasal sprays or ear drops that contain medicines that reduce swelling (steroids).  In some  cases, you may need to have a procedure to drain the fluid in your eardrum (myringotomy). In this procedure, a small tube is placed in the eardrum to:  Drain the fluid.  Restore the air in the middle ear space.  Follow these instructions at home:  Take over-the-counter and prescription medicines only as told by your health care provider.  Use techniques to help pop your ears as recommended by your health care provider. These may include: ? Chewing gum. ? Yawning. ? Frequent,  forceful swallowing. ? Closing your mouth, holding your nose closed, and gently blowing as if you are trying to blow air out of your nose.  Do not do any of the following until your health care provider approves: ? Travel to high altitudes. ? Fly in airplanes. ? Work in a Pension scheme manager or room. ? Scuba dive.  Keep your ears dry. Dry your ears completely after showering or bathing.  Do not smoke.  Keep all follow-up visits as told by your health care provider. This is important. Contact a health care provider if:  Your symptoms do not go away after treatment.  Your symptoms come back after treatment.  You are unable to pop your ears.  You have: ? A fever. ? Pain in your ear. ? Pain in your head or neck. ? Fluid draining from your ear.  Your hearing suddenly changes.  You become very dizzy.  You lose your balance. This information is not intended to replace advice given to you by your health care provider. Make sure you discuss any questions you have with your health care provider. Document Released: 08/02/2015 Document Revised: 12/12/2015 Document Reviewed: 07/25/2014 Elsevier Interactive Patient Education  [employer].

## 2017-08-26 NOTE — Progress Notes (Signed)
Subjective:    Patient ID: Shawn Gordon, male    DOB: [birth year], 57 y.o.   MRN: [id number]  Shawn Gordon is a 57 y.o. male presenting on 08/26/2017 for Dizziness ([place], dizziness, lightheadedness, nausea and vomiting. )   HPI Dizziness Pt reports onset of dizziness 4 days ago after getting home from a trip to [place] through [facility]. Pt reports he had normal adjustment of TM/ear pressure on travel up, but did not have any relief of pressure changes on return trip.  Once back in [place], he got out of car and had sensation of headrush/lightheadedness.  He notes difficulty sleeping 2/2 nausea and GI distress first night home.  After Monday, he slept well and felt better as he was only having dizziness with sitting, symptomatic with standing.  However, seems to be worsening as dizziness is occurring with sitting today. - Pt questions if it was something he ate - sick with nausea, diarrhea Sun night after arriving home.  Followed Monday am nausea with vomiting and same lightheadedness, dry heaves.    Social History   Tobacco Use  . Smoking status: Former Smoker    Packs/day: 2.00    Types: Cigarettes    Start date: 04/19/1978    Last attempt to quit: 02/26/2001    Years since quitting: 16.5  . Smokeless tobacco: Never Used  Substance Use Topics  . Alcohol use: No    Alcohol/week: 0.0 oz  . Drug use: No    Review of Systems Per HPI unless specifically indicated above     Objective:    BP 123/70 (BP Location: Right Arm, Patient Position: Sitting, Cuff Size: Normal)   Pulse 72   Temp 98.4 F (36.9 C) (Oral)   Ht 5\' 10"  (1.778 m)   Wt 257 lb 9.6 oz (116.8 kg)   BMI 36.96 kg/m   Wt Readings from Last 3 Encounters:  08/26/17 257 lb 9.6 oz (116.8 kg)  02/24/17 249 lb 6.4 oz (113.1 kg)  01/14/16 266 lb (120.7 kg)    Physical Exam  General - obese, well-appearing, NAD HEENT - Normocephalic, atraumatic, PERRL, EOMI, patent nares  w/o congestion, oropharynx clear, MMM, TM (R Bulging/L retracted; Injected TM bilat), Ear canal normal, external ear normal w/o lesions, sinuses non-tender Neck - supple, non-tender, no LAD, no thyromegaly, no carotid bruit Heart - RRR, no murmurs heard Lungs - Clear throughout all lobes, no wheezing, crackles, or rhonchi. Normal work of breathing. Extremeties - non-tender, no edema, cap refill < 2 seconds, peripheral pulses intact +2 bilaterally Skin - warm, dry, no rashes Neuro - awake, alert, oriented x3, CN II-X intact, intact muscle strength 5/5 bilaterally, intact distal sensation to light touch, normal coordination, normal gait Psych - Normal mood and affect, normal behavior    Results for orders placed or performed in visit on 02/24/17  POCT glycosylated hemoglobin (Hb A1C)  Result Value Ref Range   Hemoglobin A1C 7.1 (A) 5.7      Assessment & Plan:   Problem List Items Addressed This Visit    None    Visit Diagnoses    Need for immunization against influenza     Pt < age 58.  Needs annual influenza vaccine.  Plan: 1. Administer quadrivalent flu vaccine today.   Relevant Orders   Flu Vaccine QUAD 6+ mos PF IM (Fluarix Quad PF) (Completed)   Dizziness       Relevant  Medications   meclizine (ANTIVERT) 12.5 MG tablet   Barotrauma, otitic, initial encounter    -  Primary   Relevant Medications   fluticasone (FLONASE) 50 MCG/ACT nasal spray   predniSONE (DELTASONE) 10 MG tablet   Pt with bulging/retracted TM contralateral sides, indicative of eustachian tube dysfunction/barotrauma after driving through [facility] and not getting relief of pressure during descent.  Likely self-limited, however is symptomatic for pt with dizziness.  Plan: 1. Start meclizine 1/2-1 tab 12.5 mg tablet tid prn dizziness. 2. START flonase 2 sprays each nostril once daily x 4 weeks. 3. START prednisone taper over 7 days Day 1-2: 60 mg, Day 3: 50 mg, Day 4: 40 mg; Day 5: 30 mg; Day 6 20 mg; Day  7: 10 mg then stop.        Meds ordered this encounter  Medications  . meclizine (ANTIVERT) 12.5 MG tablet    Sig: Take 0.5-1 tablets (6.25-12.5 mg total) by mouth 3 (three) times daily as needed for dizziness.    Dispense:  30 tablet    Refill:  0    Order Specific Question:   Supervising Provider    Answer:   Olin Hauser [[id number]]  . fluticasone (FLONASE) 50 MCG/ACT nasal spray    Sig: Place 2 sprays into both nostrils daily.    Dispense:  16 g    Refill:  6    Order Specific Question:   Supervising Provider    Answer:   Olin Hauser [[id number]]  . predniSONE (DELTASONE) 10 MG tablet    Sig: Day 1-2 take 6 pills. Day 3 take 5 pills then reduce by 1 pill each day.    Dispense:  27 tablet    Refill:  0    Order Specific Question:   Supervising Provider    Answer:   Olin Hauser [[id number]]     Follow up plan: Return 7-10 days if symptoms worsen or fail to improve.   Cassell Smiles, DNP, AGPCNP-BC Adult Gerontology Primary Care Nurse Practitioner [facility] 08/26/2017, 10:18 AM

## 2017-08-30 ENCOUNTER — Encounter: Payer: Self-pay | Admitting: Nurse Practitioner

## 2018-02-22 ENCOUNTER — Other Ambulatory Visit: Payer: Self-pay | Admitting: Family Medicine

## 2018-02-22 DIAGNOSIS — E119 Type 2 diabetes mellitus without complications: Secondary | ICD-10-CM

## 2018-02-22 DIAGNOSIS — K219 Gastro-esophageal reflux disease without esophagitis: Secondary | ICD-10-CM

## 2018-02-26 ENCOUNTER — Other Ambulatory Visit: Payer: Self-pay | Admitting: Family Medicine

## 2018-02-26 DIAGNOSIS — E785 Hyperlipidemia, unspecified: Secondary | ICD-10-CM

## 2018-02-26 DIAGNOSIS — E1169 Type 2 diabetes mellitus with other specified complication: Secondary | ICD-10-CM

## 2018-02-26 DIAGNOSIS — E119 Type 2 diabetes mellitus without complications: Secondary | ICD-10-CM

## 2018-02-26 DIAGNOSIS — F3341 Major depressive disorder, recurrent, in partial remission: Secondary | ICD-10-CM

## 2018-03-09 ENCOUNTER — Other Ambulatory Visit: Payer: Self-pay | Admitting: Family Medicine

## 2018-03-09 DIAGNOSIS — K219 Gastro-esophageal reflux disease without esophagitis: Secondary | ICD-10-CM

## 2018-03-09 MED ORDER — OMEPRAZOLE 40 MG PO CPDR: 40.0000 mg | DELAYED_RELEASE_CAPSULE | Freq: Two times a day (BID) | ORAL | 1 refills | Status: DC

## 2018-05-29 ENCOUNTER — Other Ambulatory Visit: Payer: Self-pay | Admitting: Family Medicine

## 2018-05-29 DIAGNOSIS — E119 Type 2 diabetes mellitus without complications: Secondary | ICD-10-CM

## 2018-05-29 DIAGNOSIS — E1169 Type 2 diabetes mellitus with other specified complication: Secondary | ICD-10-CM

## 2018-05-29 DIAGNOSIS — K219 Gastro-esophageal reflux disease without esophagitis: Secondary | ICD-10-CM

## 2018-05-29 DIAGNOSIS — F3341 Major depressive disorder, recurrent, in partial remission: Secondary | ICD-10-CM

## 2018-05-29 DIAGNOSIS — E785 Hyperlipidemia, unspecified: Secondary | ICD-10-CM

## 2018-08-09 ENCOUNTER — Encounter: Payer: Self-pay | Admitting: Family Medicine

## 2018-08-09 ENCOUNTER — Encounter: Payer: Managed Care, Other (non HMO) | Admitting: Family Medicine

## 2018-08-09 ENCOUNTER — Ambulatory Visit (INDEPENDENT_AMBULATORY_CARE_PROVIDER_SITE_OTHER): Payer: Managed Care, Other (non HMO) | Admitting: Family Medicine

## 2018-08-09 VITALS — BP 113/68 | HR 93 | Temp 98.7°F | Resp 16 | Ht 70.0 in | Wt 263.2 lb

## 2018-08-09 DIAGNOSIS — F3341 Major depressive disorder, recurrent, in partial remission: Secondary | ICD-10-CM

## 2018-08-09 DIAGNOSIS — Z1211 Encounter for screening for malignant neoplasm of colon: Secondary | ICD-10-CM

## 2018-08-09 DIAGNOSIS — E1165 Type 2 diabetes mellitus with hyperglycemia: Secondary | ICD-10-CM | POA: Diagnosis not present

## 2018-08-09 DIAGNOSIS — E1169 Type 2 diabetes mellitus with other specified complication: Secondary | ICD-10-CM

## 2018-08-09 DIAGNOSIS — Z23 Encounter for immunization: Secondary | ICD-10-CM | POA: Diagnosis not present

## 2018-08-09 DIAGNOSIS — Z125 Encounter for screening for malignant neoplasm of prostate: Secondary | ICD-10-CM

## 2018-08-09 DIAGNOSIS — Z0000 Encounter for general adult medical examination without abnormal findings: Secondary | ICD-10-CM | POA: Diagnosis not present

## 2018-08-09 DIAGNOSIS — E785 Hyperlipidemia, unspecified: Secondary | ICD-10-CM

## 2018-08-09 NOTE — Patient Instructions (Addendum)
Thank you for coming to the office today.  Continue medications now no change  Next time we will talk about your other problems.  Flu shot today Pneumonia vaccine today next at 58   Your provider would like to you have your annual eye exam. Please contact your current eye doctor or here are some good options for you to contact.    [facility] [place], [place] [address] Phone: [phone]  --------------------------------------------   DUE for FASTING BLOOD WORK (no food or drink after midnight before the lab appointment, only water or coffee without cream/sugar on the morning of)  SCHEDULE "Lab Only" visit in the morning at the clinic for lab draw in 1 WEEK  - Make sure Lab Only appointment is at about 1 week before your next appointment, so that results will be available  For Lab Results, once available within 2-3 days of blood draw, you can can log in to MyChart online to view your results and a brief explanation. Also, we can discuss results at next follow-up visit.   Colon Cancer Screening: - For all adults age 2+ routine colon cancer screening is highly recommended.     - Recent guidelines from [place] recommend starting age of 59 - Early detection of colon cancer is important, because often there are no warning signs or symptoms, also if found early usually it can be cured. Late stage is hard to treat.  - If you are not interested in Colonoscopy screening (if done and normal you could be cleared for 5 to 10 years until next due), then Cologuard is an excellent alternative for screening test for Colon Cancer. It is highly sensitive for detecting DNA of colon cancer from even the earliest stages. Also, there is NO bowel prep required. - If Cologuard is NEGATIVE, then it is good for 3 years before next due - If Cologuard is POSITIVE, then it is strongly advised to get a Colonoscopy, which allows the GI doctor to locate the source of  the cancer or polyp (even very early stage) and treat it by removing it. ------------------------- If you would like to proceed with Cologuard (stool DNA test) - FIRST, call your insurance company and tell them you want to check cost of Cologuard tell them CPT Code [phone] (it may be completely covered and you could get for no cost, OR max cost without any coverage is about $600). Also, keep in mind if you do NOT open the kit, and decide not to do the test, you will NOT be charged, you should contact the company if you decide not to do the test. - If you want to proceed, you can notify us (phone message, [place], or at next visit) and we will order it for you. The test kit will be delivered to you house within about 1 week. Follow instructions to collect sample, you may call the company for any help or questions, 24/7 telephone support at [phone].   Please schedule a Follow-up Appointment to: Return in about 1 week (around 08/16/2018) for Follow-up Heel Pain and other question.  If you have any other questions or concerns, please feel free to call the office or send a message through [place]. You may also schedule an earlier appointment if necessary.  Additionally, you may be receiving a survey about your experience at our office within a few days to 1 week by e-mail or mail. We value your feedback.  [name], [place]  [place]

## 2018-08-09 NOTE — Progress Notes (Addendum)
Subjective:    Patient ID: Shawn Gordon, male    DOB: [birth year], 59 y.o.   MRN: [id number]  Shawn Gordon is a 58 y.o. male presenting on 08/09/2018 for Annual Exam   HPI   Here for Annual Physical and Lab Orders  CHRONIC DM, Type 2 / Morbid Obesity BMI >37 Last visit 02/2017, had prior A1c controlled. Now he admits DM poorly controlled CBGs: Avg 300s, Low >100, High < 400. Checks CBGs 2-3x weekly Meds: Metformin 1000mg  BID - Never took other meds, Onglyza or Sitagliptin in past due to cost Reports good compliance. Tolerating well w/o side-effects Currently on ACEi - He is due for DM eye exam and has some blurry vision, will go to eye doctor Lifestyle: - Diet (non adherent, poor diet, high carb/sugar) - Exercise (Active at work and does a lot of working but no regular exercise)  GERD Mostly controlled on Omeprazole 40mg  BID high dose, prior on other PPI Has some chest wheezing by his report throat irritation  DEPRESSION, chronic, recurrent, in partial remission Reports doing well currently, chronic history of depression due to life stressors Continues on Fluoxetine 40mg  daily    Health Maintenance:  Colon CA Screening: Last Colonoscopy >10 years ago. Reported result was negative, it was done for hemorrhoidal bleeding. No known fam history colon cancer. He agrees to cologuard, ordered test  No known family history of prostate CA. Request lab test for PSA  Due for Flu Shot, will receive today   Due for pneumonia vaccine initial < age 105, pneumovax-23 as diabetic    Depression screen Shawn Gordon 2/9 08/10/2018 02/24/2017 09/02/2015  Decreased Interest 1 0 0  Down, Depressed, Hopeless 1 0 0  PHQ - 2 Score 2 0 0  Altered sleeping - 0 -  Tired, decreased energy 0 1 -  Change in appetite 2 1 -  Feeling bad or failure about yourself  0 0 -  Trouble concentrating 1 1 -  Moving slowly or fidgety/restless 0 1 -  Suicidal thoughts 0 1 -  PHQ-9 Score - 5 -  Difficult doing  work/chores Not difficult at all - -   No flowsheet data found.    Past Medical History:  Diagnosis Date  . Arthritis   . Asthma   . Back pain   . GERD (gastroesophageal reflux disease)    History reviewed. No pertinent surgical history. Social History   Socioeconomic History  . Marital status: Divorced    Spouse name: Not on file  . Number of children: Not on file  . Years of education: Not on file  . Highest education level: Not on file  Occupational History  . Occupation: Product/process development scientist    Comment: Night-shift  Social Needs  . Financial resource strain: Not on file  . Food insecurity:    Worry: Not on file    Inability: Not on file  . Transportation needs:    Medical: Not on file    Non-medical: Not on file  Tobacco Use  . Smoking status: Former Smoker    Packs/day: 2.00    Types: Cigarettes    Start date: 04/19/1978    Last attempt to quit: 02/26/2001    Years since quitting: 17.4  . Smokeless tobacco: Never Used  Substance and Sexual Activity  . Alcohol use: No    Alcohol/week: 0.0 standard drinks  . Drug use: No  . Sexual activity: Not on file  Lifestyle  . Physical activity:    Days  per week: Not on file    Minutes per session: Not on file  . Stress: Not on file  Relationships  . Social connections:    Talks on phone: Not on file    Gets together: Not on file    Attends religious service: Not on file    Active member of club or organization: Not on file    Attends meetings of clubs or organizations: Not on file    Relationship status: Not on file  . Intimate partner violence:    Fear of current or ex partner: Not on file    Emotionally abused: Not on file    Physically abused: Not on file    Forced sexual activity: Not on file  Other Topics Concern  . Not on file  Social History Narrative  . Not on file   Family History  Problem Relation Age of Onset  . Stroke Mother   . Stroke Father   . Cancer Father   . Hypertension Paternal  Grandfather    Current Outpatient Medications on File Prior to Visit  Medication Sig  . atorvastatin (LIPITOR) 10 MG tablet TAKE 1 TABLET BY MOUTH EVERYDAY AT BEDTIME  . FLUoxetine (PROZAC) 40 MG capsule TAKE 1 CAPSULE BY MOUTH EVERY DAY  . lisinopril (PRINIVIL,ZESTRIL) 2.5 MG tablet TAKE 1 TABLET BY MOUTH EVERY DAY  . metFORMIN (GLUCOPHAGE) 1000 MG tablet TAKE 1 TABLET BY MOUTH TWICE A DAY WITH MEALS  . omeprazole (PRILOSEC) 40 MG capsule Take 1 capsule (40 mg total) by mouth 2 (two) times daily before a meal.  . glucose blood (TRUETEST TEST) test strip Check Blood Sugar 1-2 Times daily. (Patient not taking: Reported on 08/09/2018)  . loratadine (CLARITIN) 10 MG tablet Take 10 mg by mouth daily as needed.   . meloxicam (MOBIC) 15 MG tablet Take 7.5 mg by mouth as needed. Reported on 01/14/2016   No current facility-administered medications on file prior to visit.     Review of Systems  Constitutional: Negative for activity change, appetite change, chills, diaphoresis, fatigue and fever.  HENT: Negative for congestion and hearing loss.   Eyes: Negative for visual disturbance.  Respiratory: Negative for cough, chest tightness, shortness of breath and wheezing.   Cardiovascular: Negative for chest pain, palpitations and leg swelling.  Gastrointestinal: Negative for abdominal pain, anal bleeding, blood in stool, constipation, diarrhea, nausea and vomiting.  Endocrine: Negative for cold intolerance.  Genitourinary: Negative for decreased urine volume, dysuria, frequency, hematuria and urgency.  Musculoskeletal: Negative for arthralgias, back pain and neck pain.       Bilateral heel pain  Skin: Negative for rash.  Allergic/Immunologic: Negative for environmental allergies.  Neurological: Negative for dizziness, weakness, light-headedness, numbness and headaches.  Hematological: Negative for adenopathy.  Psychiatric/Behavioral: Negative for behavioral problems, dysphoric mood and sleep  disturbance. The patient is not nervous/anxious.    Per HPI unless specifically indicated above     Objective:    BP 113/68   Pulse 93   Temp 98.7 F (37.1 C) (Oral)   Resp 16   Ht 5\' 10"  (1.778 m)   Wt 263 lb 3.2 oz (119.4 kg)   SpO2 96%   BMI 37.77 kg/m   Wt Readings from Last 3 Encounters:  08/09/18 263 lb 3.2 oz (119.4 kg)  08/26/17 257 lb 9.6 oz (116.8 kg)  02/24/17 249 lb 6.4 oz (113.1 kg)    Physical Exam Vitals signs and nursing note reviewed.  Constitutional:      General:  He is not in acute distress.    Appearance: He is well-developed. He is not diaphoretic.     Comments: Well-appearing, comfortable, cooperative, obese  HENT:     Head: Normocephalic and atraumatic.  Eyes:     General:        Right eye: No discharge.        Left eye: No discharge.     Conjunctiva/sclera: Conjunctivae normal.  Neck:     Musculoskeletal: Normal range of motion and neck supple.     Thyroid: No thyromegaly.  Cardiovascular:     Rate and Rhythm: Normal rate and regular rhythm.     Heart sounds: Normal heart sounds. No murmur.  Pulmonary:     Effort: Pulmonary effort is normal. No respiratory distress.     Breath sounds: Normal breath sounds. No wheezing or rales.  Musculoskeletal: Normal range of motion.  Lymphadenopathy:     Cervical: No cervical adenopathy.  Skin:    General: Skin is warm and dry.     Findings: No erythema or rash.  Neurological:     Mental Status: He is alert and oriented to person, place, and time.  Psychiatric:        Behavior: Behavior normal.     Comments: Well groomed, good eye contact, normal speech and thoughts      Diabetic Foot Exam - Simple   Simple Foot Form Diabetic Foot exam was performed with the following findings:  Yes 08/09/2018  4:03 PM  Visual Inspection See comments:  Yes Sensation Testing Intact to touch and monofilament testing bilaterally:  Yes Pulse Check Posterior Tibialis and Dorsalis pulse intact bilaterally:   Yes Comments Bilateral mild dry callus formation heels and medial forefoot. Intact sensation to monofilament.     Results for orders placed or performed in visit on 02/24/17  POCT glycosylated hemoglobin (Hb A1C)  Result Value Ref Range   Hemoglobin A1C 7.1 (A) 5.7      Assessment & Plan:   Problem List Items Addressed This Visit    Hyperlipidemia associated with type 2 diabetes mellitus ([place]) Check lipids next week fasting Improve diet, wt loss exercise On statin, atorv 10, may adjust pending results    Relevant Orders   Lipid panel   Morbid Obesity Abnormal wt gain in past >6-12 months, poor diet, lifestyle Check labs Counseling on lifestyle changes    Relevant Orders   COMPLETE METABOLIC PANEL WITH GFR   Type 2 diabetes mellitus with hyperglycemia ([place]) Prior controlled, now seems severely uncontrolled with hyperglycemia readings Pending labs A8T next week Complication with morbid obesity, GERD, depression, hyperlipidemia  Continue Metformin 1000mg  BID Discussed likely need add 2nd agent such as GLP1 - consider other options as well, pending A1c DM Foot exam Advised to schedule DM Eye On Statin Follow-up 3 months    Relevant Orders   Hemoglobin A1c   COMPLETE METABOLIC PANEL WITH GFR    Other Visit Diagnoses    Annual physical exam    -  Primary  Updated Health Maintenance information Reviewed recent lab results with patient Encouraged improvement to lifestyle with diet and exercise - Goal of weight loss    Relevant Orders   Hemoglobin A1c   CBC with Differential/Platelet   COMPLETE METABOLIC PANEL WITH GFR   Lipid panel   Needs flu shot       Relevant Orders   Flu Vaccine QUAD 36+ mos IM (Completed)   Need for 23-polyvalent pneumococcal polysaccharide vaccine  Relevant Orders   Pneumococcal polysaccharide vaccine 23-valent greater than or equal to 2yo subcutaneous/IM (Completed)   Screening for colon cancer       Relevant Orders   Cologuard    Screening for prostate cancer       Relevant Orders   PSA      No orders of the defined types were placed in this encounter.   Follow up plan: Return in about 1 week (around 08/16/2018) for Follow-up Heel Pain and other question.   Future labs 1 week   [name], DO [facility] 08/09/2018, 3:50 PM

## 2018-08-10 DIAGNOSIS — F3342 Major depressive disorder, recurrent, in full remission: Secondary | ICD-10-CM | POA: Insufficient documentation

## 2018-08-10 DIAGNOSIS — F331 Major depressive disorder, recurrent, moderate: Secondary | ICD-10-CM | POA: Insufficient documentation

## 2018-08-10 DIAGNOSIS — F3341 Major depressive disorder, recurrent, in partial remission: Secondary | ICD-10-CM

## 2018-08-15 ENCOUNTER — Other Ambulatory Visit: Payer: Self-pay

## 2018-08-15 DIAGNOSIS — Z0000 Encounter for general adult medical examination without abnormal findings: Secondary | ICD-10-CM

## 2018-08-15 DIAGNOSIS — E785 Hyperlipidemia, unspecified: Secondary | ICD-10-CM

## 2018-08-15 DIAGNOSIS — E1169 Type 2 diabetes mellitus with other specified complication: Secondary | ICD-10-CM

## 2018-08-15 DIAGNOSIS — Z125 Encounter for screening for malignant neoplasm of prostate: Secondary | ICD-10-CM

## 2018-08-15 DIAGNOSIS — E1165 Type 2 diabetes mellitus with hyperglycemia: Secondary | ICD-10-CM

## 2018-08-16 ENCOUNTER — Other Ambulatory Visit: Payer: Managed Care, Other (non HMO)

## 2018-08-16 ENCOUNTER — Ambulatory Visit (INDEPENDENT_AMBULATORY_CARE_PROVIDER_SITE_OTHER): Payer: Managed Care, Other (non HMO) | Admitting: Family Medicine

## 2018-08-16 ENCOUNTER — Encounter: Payer: Self-pay | Admitting: Family Medicine

## 2018-08-16 VITALS — BP 128/77 | HR 88 | Temp 98.3°F | Resp 16 | Ht 70.0 in | Wt 261.8 lb

## 2018-08-16 DIAGNOSIS — N401 Enlarged prostate with lower urinary tract symptoms: Secondary | ICD-10-CM | POA: Diagnosis not present

## 2018-08-16 DIAGNOSIS — R361 Hematospermia: Secondary | ICD-10-CM

## 2018-08-16 DIAGNOSIS — N529 Male erectile dysfunction, unspecified: Secondary | ICD-10-CM

## 2018-08-16 DIAGNOSIS — M722 Plantar fascial fibromatosis: Secondary | ICD-10-CM | POA: Insufficient documentation

## 2018-08-16 DIAGNOSIS — N138 Other obstructive and reflux uropathy: Secondary | ICD-10-CM | POA: Insufficient documentation

## 2018-08-16 MED ORDER — TAMSULOSIN HCL 0.4 MG PO CAPS: 0.4000 mg | ORAL_CAPSULE | Freq: Every day | ORAL | 2 refills | Status: DC

## 2018-08-16 NOTE — Assessment & Plan Note (Signed)
Suspected underlying BPH newly dx without treatment Persistent for several months, primarily on masturbation Associated symptoms, with Erectile dysfunction chronic problem  Plan Not on anti platelet Check urinalysis w/ reflex to microscopy if indicated - rule out UTI and microscopic hematuria Start Flomax, treat BPH Next may treat ED with Sildenafil PDE5 Follow-up if not improve - consider refer to Urology for further diagnostic if indicated

## 2018-08-16 NOTE — Patient Instructions (Addendum)
Thank you for coming to the office today.  Enlarged prostate BPH Start Flomax 0.4mg  daily in morning. Caution may get dizzy with sudden stand or position.   Referral sent for Heel Pain  [facility] Address: [address] [place], [place] [address] Hours: Open 8AM-5PM Phone: [phone]  Urinalysis sent to lab, stay tuned.  Follow-up results of blood tests.  Please schedule a Follow-up Appointment to: Return in about 3 months (around 11/15/2018) for DM A1c, BPH, Heel pain.  If you have any other questions or concerns, please feel free to call the office or send a message through [place]. You may also schedule an earlier appointment if necessary.  Additionally, you may be receiving a survey about your experience at our office within a few days to 1 week by e-mail or mail. We value your feedback.  [name], DO [facility], [facility]                                                Plantar Fascia Stretches / Exercises  See other page with pictures of each exercise.  Start with 1 or 2 of these exercises that you are most comfortable with. Do not do any exercises that cause you significant worsening pain. Some of these may cause some "stretching soreness" but it should go away after you stop the exercise, and get better over time. Gradually increase up to 3-4 exercises as tolerated.  You may begin exercising the muscles of your foot right away by gently stretching them as follows:  Stretching: Towel stretch: Sit on a hard surface with your injured leg stretched out in front of you. Loop a towel around the ball of your foot and pull the towel toward your body keeping your knee straight. Hold this position for 15 to 30 seconds then relax. Repeat 3 times. When the towel stretch becomes to easy, you may begin doing the standing calf stretch.  Standing calf stretch: Facing a wall, put your hands against the  wall at about eye level. Keep the injured leg back, the uninjured leg forward, and the heel of your injured leg on the floor. Turn your injured foot slightly inward (as if you were pigeon-toed) as you slowly lean into the wall until you feel a stretch in the back of your calf. Hold for 15 to 30 seconds. Repeat 3 times. Do this exercise several times each day. When you can stand comfortably on your injured foot, you can begin stretching the bottom of your foot using the plantar fascia stretch.  Plantar fascia stretch: Stand with the ball of your injured foot on a stair. Reach for the bottom step with your heel until you feel a stretch in the arch of your foot. Hold this position for 15 to 30 seconds and then relax. Repeat 3 times. After you have stretched the bottom muscles of your foot, you can begin strengthening the top muscles of your foot.  Frozen can roll: Roll your bare injured foot back and forth from your heel to your mid-arch over a frozen juice can. Repeat for 3 to 5 minutes. This exercise is particularly helpful if done first thing in the morning. Towel pickup: With your heel on the ground, pick up a towel with your toes. Release. Repeat 10 to 20 times. When this gets easy, add more resistance by  placing a book or small weight on the towel. Static and dynamic balance exercises Place a chair next to your non-injured leg and stand upright. (This will provide you with balance if needed.) Stand on your injured foot. Try to raise the arch of your foot while keeping your toes on the floor. Try to maintain this position and balance on your injured side for 30 seconds. This exercise can be made more difficult by doing it on a piece of foam or a pillow, or with your eyes closed. Stand in the same position as above. Keep your foot in this position and reach forward in front of you with your injured side's hand, allowing your knee to bend. Repeat this 10 times while maintaining the arch height. This  exercise can be made more difficult by reaching farther in front of you. Do 2 sets. Stand in the same position as above. While maintaining your arch height, reach the injured side's hand across your body toward the chair. The farther you reach, the more challenging the exercise. Do 2 sets of 10.  Next, you can begin strengthening the muscles of your foot and lower leg by using elastic tubing.  Strengthening: Resisted dorsiflexion: Sit with your injured leg out straight and your foot facing a doorway. Tie a loop in one end of the tubing. Put your foot through the loop so that the tubing goes around the arch of your foot. Tie a knot in the other end of the tubing and shut the knot in the door. Move backward until there is tension in the tubing. Keeping your knee straight, pull your foot toward your body, stretching the tubing. Slowly return to the starting position. Do 3 sets of 10. Resisted plantar flexion: Sit with your leg outstretched and loop the middle section of the tubing around the ball of your foot. Hold the ends of the tubing in both hands. Gently press the ball of your foot down and point your toes, stretching the tubing. Return to the starting position. Do 3 sets of 10. Resisted inversion: Sit with your legs out straight and cross your uninjured leg over your injured ankle. Wrap the tubing around the ball of your injured foot and then loop it around your uninjured foot so that the tubing is anchored there at one end. Hold the other end of the tubing in your hand. Turn your injured foot inward and upward. This will stretch the tubing. Return to the starting position. Do 3 sets of 10. Resisted eversion: Sit with both legs stretched out in front of you, with your feet about a shoulder's width apart. Tie a loop in one end of the tubing. Put your injured foot through the loop so that the tubing goes around the arch of that foot and wraps around the outside of the uninjured foot. Hold onto the other  end of the tubing with your hand to provide tension. Turn your injured foot up and out. Make sure you keep your uninjured foot still so that it will allow the tubing to stretch as you move your injured foot. Return to the starting position. Do 3 sets of 10.

## 2018-08-16 NOTE — Assessment & Plan Note (Signed)
Likely multifactorial, age DM, among other Untreated currently Related to BPH Also w/ hematospermia See A&P  Future trial PDE5 w/ Sildenafil generic if interested, after trial for prostate BPH first

## 2018-08-16 NOTE — Assessment & Plan Note (Addendum)
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 - No known injury. Unlikely fracture based on history, no bony tenderness. - Considered achilles tendonitis but no overuse and location not consistent - Limited conservative therapy so far  Plan: Reviewed diagnosis and management of plantar fasciitis Offered med management w/ NSAID, X-ray and exercises, - he declines now and prefers 2nd opinion to Podiatry, I agree given chronicity of his problem, and complicated history with prior hip replacements / leg length changes - refer to [facility] Podiat[facility], may need x-rays, and possibly steroid injection if persistent  Otherwise, emphasized importance of relative rest, ice, and avoid prolonged standing and overuse Will mail handout and explained appropriate stretching and home exercises important to do first thing in morning and later  Follow-up if not improved

## 2018-08-16 NOTE — Assessment & Plan Note (Signed)
Consistent clinically with new diagnosis BPH, with lower urinary tract symptoms (LUTS) - AUA BPH score 15 (moderate, 07/2018) - Never on meds - Last PSA not on file, pending currently - Last DRE today - 08/16/18 - mild enlarged prostate, smooth without nodularity or abnormal - No known personal/family history of prostate CA  Likely source of his hematospermia, hopefully will improve if BPH is treated  Plan: 1. Start Tamsulosin 0.4mg  daily, advised on benefits, risks, if BP low caution with sudden standing up or position change 2. Follow-up 3 months, consider future increased dose vs future add Finasteride alpha blocker to reduce prostate size (note change in PSA), future referral to Urology if remains uncontrolled

## 2018-08-16 NOTE — Progress Notes (Signed)
Subjective:    Patient ID: JAVION HOLMER, male    DOB: 04/17/1961, 58 y.o.   MRN: [id number]  Makale Pindell Neumeister is a 58 y.o. male presenting on 08/16/2018 for Foot Pain (onset 4 months)   HPI    Heel Pain, Left > Right / History of hip replacements Reports he has symptoms of Left heel pain feels like "bruised heel" he has had this before when wrestled years ago, seems to improve with activity and walking. He is not limping. His left leg is slightly longer than R, he has had both hips replaced, he uses a heel lift on R shoe. Pain in heel he feels good with relief of pressure on foot/heel and doing well, until he gets up again and he will have sever excruciating pain temporarily while he walks through the pain and then it will ease off and remain sore, back with the constant pain. - He works in Architect, works on his feet regularly, needs to get this treated - He is no longer on NSAID had prior rx meloxicam 7.5 back in 2016, he is not on NSAID OTC or Tylenol, no other treatments tried - History of arthritis in multiple joints. No imaging of feet. Not seen podiatry. Denies redness swelling, numbness tingling, injury fall trauma   Hematospermia / Erectil Dysfunction Reports new episode of blood mixed in with semen onset 04/2018, he does not have any pain with intercourse or masturbation, also describes a gradual worsening erection dysfunction with decreased ability to get and also to maintain an erection, he has had ED symptoms for several years, prior PCP treated with Cialis but he could not afford it at the time. Has not tried PDE5 before. - No prior dx BPH in past.  AUA BPH Symptom Score over past 1 month 1. Sensation of not emptying bladder post void - 0 2. Urinate less than 2 hour after finish last void - 4 3. Start/Stop several times during void - 0 4. Difficult to postpone urination - 4 5. Weak urinary stream - 5 6. Push or strain urination - 0 7. Nocturia - 2 times  Total  Score: 15 (Moderate BPH symptoms)  Denies any gross hematuria, dysuria, frequency, discharge, abdominal pain, flank pain, nausea vomiting   Depression screen [facility] 2/9 08/10/2018 02/24/2017 09/02/2015  Decreased Interest 1 0 0  Down, Depressed, Hopeless 1 0 0  PHQ - 2 Score 2 0 0  Altered sleeping - 0 -  Tired, decreased energy 0 1 -  Change in appetite 2 1 -  Feeling bad or failure about yourself  0 0 -  Trouble concentrating 1 1 -  Moving slowly or fidgety/restless 0 1 -  Suicidal thoughts 0 1 -  PHQ-9 Score - 5 -  Difficult doing work/chores Not difficult at all - -    Social History   Tobacco Use  . Smoking status: Former Smoker    Packs/day: 2.00    Types: Cigarettes    Start date: 04/19/1978    Last attempt to quit: 02/26/2001    Years since quitting: 17.4  . Smokeless tobacco: Former Network engineer Use Topics  . Alcohol use: No    Alcohol/week: 0.0 standard drinks  . Drug use: No    Review of Systems Per HPI unless specifically indicated above     Objective:    BP 128/77   Pulse 88   Temp 98.3 F (36.8 C) (Oral)   Resp 16   Ht 5\' 10"  (  1.778 m)   Wt 261 lb 12.8 oz (118.8 kg)   BMI 37.56 kg/m   Wt Readings from Last 3 Encounters:  08/16/18 261 lb 12.8 oz (118.8 kg)  08/09/18 263 lb 3.2 oz (119.4 kg)  08/26/17 257 lb 9.6 oz (116.8 kg)    Physical Exam Vitals signs and nursing note reviewed.  Constitutional:      General: He is not in acute distress.    Appearance: He is well-developed. He is not diaphoretic.     Comments: Well-appearing, comfortable, cooperative  HENT:     Head: Normocephalic and atraumatic.  Eyes:     General:        Right eye: No discharge.        Left eye: No discharge.     Conjunctiva/sclera: Conjunctivae normal.  Neck:     Musculoskeletal: Normal range of motion and neck supple.     Thyroid: No thyromegaly.  Cardiovascular:     Rate and Rhythm: Normal rate and regular rhythm.     Heart sounds: Normal heart sounds. No murmur.   Pulmonary:     Effort: Pulmonary effort is normal. No respiratory distress.     Breath sounds: Normal breath sounds. No wheezing or rales.  Genitourinary:    Comments: Rectal/DRE: Normal external exam without hemorrhoids fissures or abnormality. DRE with palpation of mildly enlarged prostate smooth symmetrical without nodule or tenderness. Musculoskeletal: Normal range of motion.     Comments: Left Foot Inspection: mostly normal appearance, has well preserved medial foot arch without obvious deformity Palpation: localized tender to deep palpation only middle central heel plantar aspect and slightly lateral aspect, less over medial aspect. Non tender malleoli / achilles, mid foot and forefoot, metacarpals ROM: full active Strength: distal intact Neurovascular: distal intact  Gait is normal without limping or antalgic gait.  Lymphadenopathy:     Cervical: No cervical adenopathy.  Skin:    General: Skin is warm and dry.     Findings: No erythema or rash.  Neurological:     Mental Status: He is alert and oriented to person, place, and time.  Psychiatric:        Behavior: Behavior normal.     Comments: Well groomed, good eye contact, normal speech and thoughts      Results for orders placed or performed in visit on 02/24/17  POCT glycosylated hemoglobin (Hb A1C)  Result Value Ref Range   Hemoglobin A1C 7.1 (A) 5.7      Assessment & Plan:   Problem List Items Addressed This Visit    BPH with obstruction/lower urinary tract symptoms    Consistent clinically with new diagnosis BPH, with lower urinary tract symptoms (LUTS) - AUA BPH score 15 (moderate, 07/2018) - Never on meds - Last PSA not on file, pending currently - Last DRE today - 08/16/18 - mild enlarged prostate, smooth without nodularity or abnormal - No known personal/family history of prostate CA  Likely source of his hematospermia, hopefully will improve if BPH is treated  Plan: 1. Start Tamsulosin 0.4mg  daily,  advised on benefits, risks, if BP low caution with sudden standing up or position change 2. Follow-up 3 months, consider future increased dose vs future add Finasteride alpha blocker to reduce prostate size (note change in PSA), future referral to Urology if remains uncontrolled      Relevant Medications   tamsulosin (FLOMAX) 0.4 MG CAPS capsule   Erectile dysfunction    Likely multifactorial, age DM, among other Untreated currently Related to BPH  Also w/ hematospermia See A&P  Future trial PDE5 w/ Sildenafil generic if interested, after trial for prostate BPH first      Hematospermia - Primary    Suspected underlying BPH newly dx without treatment Persistent for several months, primarily on masturbation Associated symptoms, with Erectile dysfunction chronic problem  Plan Not on anti platelet Check urinalysis w/ reflex to microscopy if indicated - rule out UTI and microscopic hematuria Start Flomax, treat BPH Next may treat ED with Sildenafil PDE5 Follow-up if not improve - consider refer to Urology for further diagnostic if indicated      Relevant Orders   Urinalysis, Routine w reflex microscopic   Plantar fasciitis of left foot    Clinically consistent with 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 - No known injury. Unlikely fracture based on history, no bony tenderness. - Considered achilles tendonitis but no overuse and location not consistent - Limited conservative therapy so far  Plan: Reviewed diagnosis and management of plantar fasciitis Offered med management w/ NSAID, X-ray and exercises, - he declines now and prefers 2nd opinion to Podiatry, I agree given chronicity of his problem, and complicated history with prior hip replacements / leg length changes - [facility] to [facility], may need x-rays, and possibly steroid injection if  persistent  Otherwise, emphasized importance of relative rest, ice, and avoid prolonged standing and overuse Will mail handout and explained appropriate stretching and home exercises important to do first thing in morning and later  Follow-up if not improved      Relevant Orders   Ambulatory referral to Podiatry      Meds ordered this encounter  Medications  . tamsulosin (FLOMAX) 0.4 MG CAPS capsule    Sig: Take 1 capsule (0.4 mg total) by mouth daily after breakfast.    Dispense:  30 capsule    Refill:  2   Orders Placed This Encounter  Procedures  . Urinalysis, Routine w reflex microscopic  . Ambulatory referral to Podiatry    Referral Priority:   Routine    Referral Type:   Consultation    Referral Reason:   Specialty Services Required    Requested Specialty:   Podiatry    Number of Visits Requested:   1    Follow up plan: Return in about 3 months (around 11/15/2018) for DM A1c, BPH, Heel pain.   [name], [facility] 08/16/2018, 8:14 AM

## 2018-08-17 LAB — COMPLETE METABOLIC PANEL WITH GFR
AG Ratio: 1.3 (calc) (ref 1.0–2.5)
ALKALINE PHOSPHATASE (APISO): 81 U/L (ref 40–115)
ALT: 33 U/L (ref 9–46)
AST: 18 U/L (ref 10–35)
Albumin: 4 g/dL (ref 3.6–5.1)
BUN: 19 mg/dL (ref 7–25)
CO2: 24 mmol/L (ref 20–32)
Calcium: 9.6 mg/dL (ref 8.6–10.3)
Chloride: 100 mmol/L (ref 98–110)
Creat: 0.72 mg/dL (ref 0.70–1.33)
GFR, Est African American: 118 mL/min/{1.73_m2} (ref >=60)
GFR, Est Non African American: 102 mL/min/{1.73_m2} (ref >=60)
Globulin: 3.1 g/dL (calc) (ref 1.9–3.7)
Glucose, Bld: 348 mg/dL — ABNORMAL HIGH (ref 65–99)
Potassium: 4.6 mmol/L (ref 3.5–5.3)
Sodium: 136 mmol/L (ref 135–146)
Total Bilirubin: 0.3 mg/dL (ref 0.2–1.2)
Total Protein: 7.1 g/dL (ref 6.1–8.1)

## 2018-08-17 LAB — CBC WITH DIFFERENTIAL/PLATELET
Absolute Monocytes: 389 cells/uL (ref 200–950)
Basophils Absolute: 41 cells/uL (ref 0–200)
Basophils Relative: 0.7 %
EOS PCT: 5.3 %
Eosinophils Absolute: 307 cells/uL (ref 15–500)
HCT: 41.3 % (ref 38.5–50.0)
Hemoglobin: 13 g/dL — ABNORMAL LOW (ref 13.2–17.1)
Lymphs Abs: 1166 cells/uL (ref 850–3900)
MCH: 26.5 pg — ABNORMAL LOW (ref 27.0–33.0)
MCHC: 31.5 g/dL — ABNORMAL LOW (ref 32.0–36.0)
MCV: 84.3 fL (ref 80.0–100.0)
MPV: 11.1 fL (ref 7.5–12.5)
Monocytes Relative: 6.7 %
NEUTROS ABS: 3898 {cells}/uL (ref 1500–7800)
Neutrophils Relative %: 67.2 %
Platelets: 315 10*3/uL (ref 140–400)
RBC: 4.9 10*6/uL (ref 4.20–5.80)
RDW: 12.7 % (ref 11.0–15.0)
Total Lymphocyte: 20.1 %
WBC: 5.8 10*3/uL (ref 3.8–10.8)

## 2018-08-17 LAB — URINALYSIS, ROUTINE W REFLEX MICROSCOPIC
Bilirubin Urine: NEGATIVE
HGB URINE DIPSTICK: NEGATIVE
Leukocytes, UA: NEGATIVE
Nitrite: NEGATIVE
PH: 7 (ref 5.0–8.0)
Protein, ur: NEGATIVE
Specific Gravity, Urine: 1.037 — ABNORMAL HIGH (ref 1.001–1.03)

## 2018-08-17 LAB — PSA: PSA: 0.8 ng/mL (ref <=4.0)

## 2018-08-17 LAB — LIPID PANEL
Cholesterol: 193 mg/dL (ref <200)
HDL: 42 mg/dL (ref >40)
LDL Cholesterol (Calc): 117 mg/dL (calc) — ABNORMAL HIGH
NON-HDL CHOLESTEROL (CALC): 151 mg/dL — AB (ref <130)
Total CHOL/HDL Ratio: 4.6 (calc) (ref <5.0)
Triglycerides: 222 mg/dL — ABNORMAL HIGH (ref <150)

## 2018-08-17 LAB — HEMOGLOBIN A1C
Hgb A1c MFr Bld: 12.7 % of total Hgb — ABNORMAL HIGH (ref <5.7)
MEAN PLASMA GLUCOSE: 318 (calc)
eAG (mmol/L): 17.6 (calc)

## 2018-08-19 LAB — HM DIABETES EYE EXAM

## 2018-08-22 ENCOUNTER — Ambulatory Visit (INDEPENDENT_AMBULATORY_CARE_PROVIDER_SITE_OTHER): Payer: Managed Care, Other (non HMO) | Admitting: Family Medicine

## 2018-08-22 ENCOUNTER — Encounter: Payer: Self-pay | Admitting: Family Medicine

## 2018-08-22 VITALS — BP 127/72 | HR 92 | Temp 98.3°F | Resp 16 | Ht 70.0 in | Wt 266.6 lb

## 2018-08-22 DIAGNOSIS — E1165 Type 2 diabetes mellitus with hyperglycemia: Secondary | ICD-10-CM

## 2018-08-22 MED ORDER — EXENATIDE ER 2 MG/0.85ML ~~LOC~~ AUIJ: 2.0000 mg | AUTO-INJECTOR | SUBCUTANEOUS | 0 refills | Status: DC

## 2018-08-22 NOTE — Patient Instructions (Addendum)
Thank you for coming to the office today.  Start Bydureon BCise once weekly injection - 4 samples given, after 3 weeks you can contact us if ready for new rx for this med, make sure you turn in the Co-pay card as well.  Also ask insurance about the other diabetes medications:  - [place]  Please schedule a Follow-up Appointment to: Return in about 3 months (around 11/20/2018) for DM A1c med adjust.  If you have any other questions or concerns, please feel free to call the office or send a message through [place]. You may also schedule an earlier appointment if necessary.  Additionally, you may be receiving a survey about your experience at our office within a few days to 1 week by e-mail or mail. We value your feedback.  Shawn Putnam, DO [place]

## 2018-08-22 NOTE — Progress Notes (Signed)
Subjective:    Patient ID: Shawn Gordon, male    DOB: 01-14-1961, 58 y.o.   MRN: [id number]  Shawn Gordon is a 58 y.o. male presenting on 08/22/2018 for Medication Problem (consultation regarding A1c, btw runny nose, exhaustion onset yesterday)   HPI   CHRONIC DM, Type 2 with hyperglycemia, uncontrolled. Interval update patient had been lost to follow-up for Diabetes for >1 year+, now recently has had labs since last visit recently A1c dramatically elevated now up to 12.7, uncontrolled. Attributed to poor diet and lifestyle and weight gain primarily. CBGs: Limited CBG results Meds: Metformin 1057m BID Reports good compliance. Tolerating well w/o side-effects Currently on ACEi Lifestyle: - Diet (no longer adhering well to DM diet, plans to improve low carb low sugar again) - Exercise (Active at work and does a lot of working but no regular exercise) - Weight gain +15-20 lbs in >3-6 months Denies hypoglycemia, polyuria, visual changes, numbness or tingling.   Depression screen [facility]2/9 08/10/2018 02/24/2017 09/02/2015  Decreased Interest 1 0 0  Down, Depressed, Hopeless 1 0 0  PHQ - 2 Score 2 0 0  Altered sleeping - 0 -  Tired, decreased energy 0 1 -  Change in appetite 2 1 -  Feeling bad or failure about yourself  0 0 -  Trouble concentrating 1 1 -  Moving slowly or fidgety/restless 0 1 -  Suicidal thoughts 0 1 -  PHQ-9 Score - 5 -  Difficult doing work/chores Not difficult at all - -    Social History   Tobacco Use  . Smoking status: Former Smoker    Packs/day: 2.00    Types: Cigarettes    Start date: 04/19/1978    Last attempt to quit: 02/26/2001    Years since quitting: 17.4  . Smokeless tobacco: Former UNetwork engineerUse Topics  . Alcohol use: No    Alcohol/week: 0.0 standard drinks  . Drug use: No    Review of Systems Per HPI unless specifically indicated above     Objective:    BP 127/72   Pulse 92   Temp 98.3 F (36.8 C) (Oral)   Resp 16   Ht  _0  (1.778 m)   Wt 266 lb 9.6 oz (120.9 kg)   BMI 38.25 kg/m   Wt Readings from Last 3 Encounters:  08/22/18 266 lb 9.6 oz (120.9 kg)  08/16/18 261 lb 12.8 oz (118.8 kg)  08/09/18 263 lb 3.2 oz (119.4 kg)    Physical Exam Vitals signs and nursing note reviewed.  Constitutional:      General: He is not in acute distress.    Appearance: He is well-developed. He is not diaphoretic.     Comments: Well-appearing, comfortable, cooperative, obese  HENT:     Head: Normocephalic and atraumatic.  Eyes:     General:        Right eye: No discharge.        Left eye: No discharge.     Conjunctiva/sclera: Conjunctivae normal.  Cardiovascular:     Rate and Rhythm: Normal rate.  Pulmonary:     Effort: Pulmonary effort is normal.  Skin:    General: Skin is warm and dry.     Findings: No erythema or rash.  Neurological:     Mental Status: He is alert and oriented to person, place, and time.  Psychiatric:        Behavior: Behavior normal.     Comments: Well groomed, good eye contact,  normal speech and thoughts    Recent Labs    08/16/18 0757  HGBA1C 12.7*       Assessment & Plan:   Problem List Items Addressed This Visit    Type 2 diabetes mellitus with hyperglycemia ([place]) - Primary    Uncontrolled DM with hyperglycemia, A1c 12.7, lost to follow-up uncontrolled for while, poor diet/exercise Complications - other including hyperlipidemia, GERD, obesity - increases risk of future cardiovascular complications   Plan:  1. Discussed new medication options for advanced management of DM, reviewed risk and complication of diabetes untreated, he checked insurance, due to high deductible plan he is concerned may not be able to afford any of these new GLP1 medication - but he is willing to try the lowest cost option Bydureon >$400 pre deductible - Sample given today Bydureon BCise 41m weekly inj x 4 pen = 1 month supply, he was given copay card, to activate, when ready for new rx in 3-4  weeks, will send and hopefully with copay card and if his deductible is met he will have affordable med option - Continue Metformin 10018mBID for now - In future if cost is barrier we can try other options, but advised I have limited options, other than insulin for A1c >10 that would be cost effective - I also gave him list of other meds DPP4 and SGLT2 to check price with insurance 2. Encourage improved lifestyle - low carb, low sugar diet, reduce portion size, continue improving regular exercise 3. Check CBG , bring log to next visit for review 4. Follow-up as scheduled 3 months DM A1c med adjust       Relevant Medications   Exenatide ER (BYDUREON BCISE) 2 MG/0.85ML AUIJ      Meds ordered this encounter  Medications  . Exenatide ER (BYDUREON BCISE) 2 MG/0.85ML AUIJ    Sig: Inject 2 mg into the skin once a week.    Dispense:  4 pen    Refill:  0     Follow up plan: Return in about 3 months (around 11/20/2018) for DM A1c med adjust.   AlNobie PutnamDO [facility] 08/22/2018, 8:13 AM

## 2018-08-22 NOTE — Assessment & Plan Note (Signed)
Uncontrolled DM with hyperglycemia, A1c 12.7, lost to follow-up uncontrolled for while, poor diet/exercise Complications - other including hyperlipidemia, GERD, obesity - increases risk of future cardiovascular complications   Plan:  1. Discussed new medication options for advanced management of DM, reviewed risk and complication of diabetes untreated, he checked insurance, due to high deductible plan he is concerned may not be able to afford any of these new GLP1 medication - but he is willing to try the lowest cost option Bydureon >$400 pre deductible - Sample given today Bydureon BCise 61m weekly inj x 4 pen = 1 month supply, he was given copay card, to activate, when ready for new rx in 3-4 weeks, will send and hopefully with copay card and if his deductible is met he will have affordable med option - Continue Metformin 10044mBID for now - In future if cost is barrier we can try other options, but advised I have limited options, other than insulin for A1c >10 that would be cost effective - I also gave him list of other meds DPP4 and SGLT2 to check price with insurance 2. Encourage improved lifestyle - low carb, low sugar diet, reduce portion size, continue improving regular exercise 3. Check CBG , bring log to next visit for review 4. Follow-up as scheduled 3 months DM A1c med adjust

## 2018-08-23 ENCOUNTER — Encounter: Payer: Self-pay | Admitting: Podiatry

## 2018-08-26 LAB — COLOGUARD: Cologuard: NEGATIVE

## 2018-08-30 ENCOUNTER — Encounter: Payer: Self-pay | Admitting: Family Medicine

## 2018-09-02 ENCOUNTER — Encounter: Payer: Self-pay | Admitting: Podiatry

## 2018-09-02 ENCOUNTER — Ambulatory Visit (INDEPENDENT_AMBULATORY_CARE_PROVIDER_SITE_OTHER): Payer: Managed Care, Other (non HMO)

## 2018-09-02 ENCOUNTER — Other Ambulatory Visit: Payer: Self-pay | Admitting: Podiatry

## 2018-09-02 ENCOUNTER — Ambulatory Visit (INDEPENDENT_AMBULATORY_CARE_PROVIDER_SITE_OTHER): Payer: Managed Care, Other (non HMO) | Admitting: Podiatry

## 2018-09-02 VITALS — BP 130/82 | HR 83

## 2018-09-02 DIAGNOSIS — M722 Plantar fascial fibromatosis: Secondary | ICD-10-CM

## 2018-09-02 MED ORDER — MELOXICAM 15 MG PO TABS: 15.0000 mg | ORAL_TABLET | Freq: Every day | ORAL | 1 refills | Status: AC

## 2018-09-05 NOTE — Progress Notes (Signed)
   Subjective: 58 year old male presenting today as a new patient with a chief complaint of bilateral heel pain, left worse than right, that began a few months ago. He states he believes the right heel hurts due to overcompensating. He has not done anything for treatment. Walking for long periods of time increases the pain. Patient is here for further evaluation and treatment.   Past Medical History:  Diagnosis Date  . Arthritis   . Asthma   . Back pain   . GERD (gastroesophageal reflux disease)      Objective: Physical Exam General: The patient is alert and oriented x3 in no acute distress.  Dermatology: Skin is warm, dry and supple bilateral lower extremities. Negative for open lesions or macerations bilateral.   Vascular: Dorsalis Pedis and Posterior Tibial pulses palpable bilateral.  Capillary fill time is immediate to all digits.  Neurological: Epicritic and protective threshold intact bilateral.   Musculoskeletal: Tenderness to palpation to the plantar aspect of the bilateral heels along the plantar fascia. All other joints range of motion within normal limits bilateral. Strength 5/5 in all groups bilateral.   Radiographic exam: Normal osseous mineralization. Joint spaces preserved. No fracture/dislocation/boney destruction. No other soft tissue abnormalities or radiopaque foreign bodies.   Assessment: 1. plantar fasciitis bilateral feet  Plan of Care:  1. Patient evaluated. Xrays reviewed.   2. Injection of 0.5cc Cel[place] soluspan injected into the bilateral heels.  3. Rx for Meloxicam ordered for patient. 4. Appointment with Liliane Channel, Pedorthist, for custom molded orthotics.  5. Plantar fascial band(s) dispensed for bilateral plantar fasciitis. 6. Instructed patient regarding therapies and modalities at home to alleviate symptoms.  7. Return to clinic in 4 weeks.    Edrick Kins, DPM [facility]  Dr. Edrick Kins, DPM    [address], [place] [address]                Office [phone]  Fax [phone]

## 2018-09-24 NOTE — Progress Notes (Signed)
This encounter was created in error - please disregard.

## 2018-09-28 ENCOUNTER — Other Ambulatory Visit: Payer: Self-pay

## 2018-09-28 ENCOUNTER — Ambulatory Visit (INDEPENDENT_AMBULATORY_CARE_PROVIDER_SITE_OTHER): Payer: Managed Care, Other (non HMO) | Admitting: Orthotics

## 2018-09-28 DIAGNOSIS — M722 Plantar fascial fibromatosis: Secondary | ICD-10-CM

## 2018-09-28 NOTE — Progress Notes (Signed)

## 2018-09-30 ENCOUNTER — Encounter: Payer: Self-pay | Admitting: Podiatry

## 2018-09-30 ENCOUNTER — Other Ambulatory Visit: Payer: Self-pay

## 2018-09-30 ENCOUNTER — Ambulatory Visit (INDEPENDENT_AMBULATORY_CARE_PROVIDER_SITE_OTHER): Payer: Managed Care, Other (non HMO) | Admitting: Podiatry

## 2018-09-30 DIAGNOSIS — M722 Plantar fascial fibromatosis: Secondary | ICD-10-CM

## 2018-10-03 NOTE — Progress Notes (Signed)
   Subjective: 58 year old male presenting today for follow up evaluation of plantar fasciitis of the bilateral feet. He states he is doing well. He denies any significant pain or modifying factors. He has been using the plantar fascial braces and taking Meloxicam as directed for treatment. Patient is here for further evaluation and treatment.   Past Medical History:  Diagnosis Date  . Arthritis   . Asthma   . Back pain   . GERD (gastroesophageal reflux disease)      Objective: Physical Exam General: The patient is alert and oriented x3 in no acute distress.  Dermatology: Skin is warm, dry and supple bilateral lower extremities. Negative for open lesions or macerations bilateral.   Vascular: Dorsalis Pedis and Posterior Tibial pulses palpable bilateral.  Capillary fill time is immediate to all digits.  Neurological: Epicritic and protective threshold intact bilateral.   Musculoskeletal: Tenderness to palpation to the plantar aspect of the bilateral heels along the plantar fascia. All other joints range of motion within normal limits bilateral. Strength 5/5 in all groups bilateral.   Assessment: 1. plantar fasciitis bilateral feet - improved  Plan of Care:  1. Patient evaluated.  2. Continue taking Meloxicam as needed.  3. Patient fitted for custom orthotics and waiting for them to come in.  4. Continue using plantar fascial braces.  5. Return to clinic as needed.     Edrick Kins, DPM [facility]  Dr. Edrick Kins, DPM    [address], [place] [address]                Office [phone]  Fax [phone]

## 2018-10-19 ENCOUNTER — Ambulatory Visit (INDEPENDENT_AMBULATORY_CARE_PROVIDER_SITE_OTHER): Payer: Managed Care, Other (non HMO) | Admitting: Orthotics

## 2018-10-19 ENCOUNTER — Other Ambulatory Visit: Payer: Self-pay

## 2018-10-19 DIAGNOSIS — M722 Plantar fascial fibromatosis: Secondary | ICD-10-CM

## 2018-10-19 NOTE — Progress Notes (Signed)
Sending f/o back to [place] to correct:  Hug arch....move apex back about 1/8"

## 2018-11-02 ENCOUNTER — Other Ambulatory Visit: Payer: Managed Care, Other (non HMO) | Admitting: Orthotics

## 2018-11-09 ENCOUNTER — Other Ambulatory Visit: Payer: Self-pay

## 2018-11-09 ENCOUNTER — Ambulatory Visit: Payer: Managed Care, Other (non HMO) | Admitting: Orthotics

## 2018-11-09 DIAGNOSIS — M722 Plantar fascial fibromatosis: Secondary | ICD-10-CM

## 2018-11-09 NOTE — Progress Notes (Signed)
Right/ f/o still not right...needs to be increase more and apex moved back.

## 2018-11-10 ENCOUNTER — Other Ambulatory Visit: Payer: Self-pay | Admitting: Family Medicine

## 2018-11-10 DIAGNOSIS — N138 Other obstructive and reflux uropathy: Secondary | ICD-10-CM

## 2018-11-10 DIAGNOSIS — N401 Enlarged prostate with lower urinary tract symptoms: Principal | ICD-10-CM

## 2018-11-15 ENCOUNTER — Other Ambulatory Visit: Payer: Self-pay | Admitting: Family Medicine

## 2018-11-15 DIAGNOSIS — E1165 Type 2 diabetes mellitus with hyperglycemia: Secondary | ICD-10-CM

## 2018-11-15 NOTE — Progress Notes (Signed)
hemoglobin   

## 2018-11-17 ENCOUNTER — Other Ambulatory Visit: Payer: Self-pay

## 2018-11-17 ENCOUNTER — Other Ambulatory Visit: Payer: Managed Care, Other (non HMO)

## 2018-11-17 DIAGNOSIS — E1165 Type 2 diabetes mellitus with hyperglycemia: Secondary | ICD-10-CM

## 2018-11-18 ENCOUNTER — Encounter: Payer: Self-pay | Admitting: Family Medicine

## 2018-11-18 ENCOUNTER — Ambulatory Visit (INDEPENDENT_AMBULATORY_CARE_PROVIDER_SITE_OTHER): Payer: Managed Care, Other (non HMO) | Admitting: Family Medicine

## 2018-11-18 ENCOUNTER — Other Ambulatory Visit: Payer: Self-pay

## 2018-11-18 DIAGNOSIS — E1165 Type 2 diabetes mellitus with hyperglycemia: Secondary | ICD-10-CM | POA: Diagnosis not present

## 2018-11-18 LAB — HEMOGLOBIN A1C
Hgb A1c MFr Bld: 11 % of total Hgb — ABNORMAL HIGH (ref <5.7)
Mean Plasma Glucose: 269 (calc)
eAG (mmol/L): 14.9 (calc)

## 2018-11-18 MED ORDER — EXENATIDE ER 2 MG/0.85ML ~~LOC~~ AUIJ: 2.0000 mg | AUTO-INJECTOR | SUBCUTANEOUS | 0 refills | Status: DC

## 2018-11-18 NOTE — Assessment & Plan Note (Signed)
Uncontrolled DM with hyperglycemia, slight improve A1c from 12.7 down to 11. poor diet/exercise Complications - other including hyperlipidemia, GERD, obesity - increases risk of future cardiovascular complications   Plan:  1. Restart plan from previous visit now met deductible - cost barrier is removed - Sample x 3 of bydureon bcise pen, to pick up from office today - Notify office when ready for new rx for Bydureon BCise 72m weekly inj, prefer to order at end of May 2020 - Continue Metformin 10083mBID for now 2. Encourage improved lifestyle - low carb, low sugar diet, reduce portion size, continue improving regular exercise 3. Check CBG , bring log to next visit for review 4. Follow-up as scheduled 3 months DM A1c med adjust

## 2018-11-18 NOTE — Patient Instructions (Addendum)
Recent Labs    08/16/18 0757 11/17/18 0810  HGBA1C 12.7* 11.0*   Bydureon BCise samples - use for 3 weeks - then call us by 2-3 weeks to give [place] time to send new rx to your pharmacy, you will have refills, keep using it weekly.  Hopefully deductible will be gone and cost will be low - otherwise we can call for assistance on the financials from our Case Management team  Try to improve diet as well as discussed  Please schedule a Follow-up Appointment to: Return in about 3 months (around 02/18/2019) for DM A1c, med adjust.  If you have any other questions or concerns, please feel free to call the office or send a message through [place]. You may also schedule an earlier appointment if necessary.  Additionally, you may be receiving a survey about your experience at our office within a few days to 1 week by e-mail or mail. We value your feedback.  [name], DO [place]

## 2018-11-18 NOTE — Progress Notes (Signed)
Virtual Visit via Telephone The purpose of this virtual visit is to provide medical care while limiting exposure to the novel coronavirus (COVID19) for both patient and office staff.  Consent was obtained for phone visit:  Yes.   Answered questions that patient had about telehealth interaction:  Yes.   I discussed the limitations, risks, security and privacy concerns of performing an evaluation and management service by telephone. I also discussed with the patient that there may be a patient responsible charge related to this service. The patient expressed understanding and agreed to proceed.  Patient Location: Home Provider Location: [facility])  ---------------------------------------------------------------------- Chief Complaint  Patient presents with  . Diabetes    S: Reviewed CMA documentation. I have called patient and gathered additional HPI as follows:  CHRONIC DM, Type 2 with hyperglycemia, uncontrolled. - Last visit with me 08/2018, for same problem, treated with initial GLP1 bydureon bcise, samples given but copay was $400 due to high deductible insurance plan, see prior notes for background information. - Interval update with now he has nearly met deductible and wants to restart Bydureon, request sample again - Today patient reports suspect his blood sugar is higher - Recent lab A1c 11, improved from >12 last time CBGs: Limited CBG results Meds:Metformin 1048m BID Reports good compliance. Tolerating well w/o side-effects Currently on ACEi Lifestyle: - Diet (admits poor diet at times now due to coronavirus pandemic, relying on sandwich more) - Exercise (reduced activity exercise) Denies hypoglycemia, polyuria, visual changes, numbness or tingling.   Denies any high risk travel to areas of current concern for COVID19. Denies any known or suspected exposure to person with or possibly with COVID19.  Denies any fevers, chills, sweats, body ache,  cough, shortness of breath, sinus pain or pressure, headache, abdominal pain, diarrhea  Past Medical History:  Diagnosis Date  . Arthritis   . Asthma   . Back pain   . GERD (gastroesophageal reflux disease)    Social History   Tobacco Use  . Smoking status: Former Smoker    Packs/day: 2.00    Types: Cigarettes    Start date: 04/19/1978    Last attempt to quit: 02/26/2001    Years since quitting: 17.7  . Smokeless tobacco: Former UNetwork engineerUse Topics  . Alcohol use: No    Alcohol/week: 0.0 standard drinks  . Drug use: No    Current Outpatient Medications:  .  atorvastatin (LIPITOR) 10 MG tablet, TAKE 1 TABLET BY MOUTH EVERYDAY AT BEDTIME, Disp: 90 tablet, Rfl: 1 .  Exenatide ER (BYDUREON BCISE) 2 MG/0.85ML AUIJ, Inject 2 mg into the skin once a week., Disp: 3 pen, Rfl: 0 .  FLUoxetine (PROZAC) 40 MG capsule, TAKE 1 CAPSULE BY MOUTH EVERY DAY, Disp: 90 capsule, Rfl: 1 .  glucose blood (TRUETEST TEST) test strip, Check Blood Sugar 1-2 Times daily., Disp: 100 each, Rfl: 12 .  lisinopril (PRINIVIL,ZESTRIL) 2.5 MG tablet, TAKE 1 TABLET BY MOUTH EVERY DAY, Disp: 90 tablet, Rfl: 1 .  loratadine (CLARITIN) 10 MG tablet, Take 10 mg by mouth daily as needed. , Disp: , Rfl:  .  meloxicam (MOBIC) 15 MG tablet, Take 15 mg by mouth daily., Disp: , Rfl:  .  metFORMIN (GLUCOPHAGE) 1000 MG tablet, TAKE 1 TABLET BY MOUTH TWICE A DAY WITH MEALS, Disp: 180 tablet, Rfl: 1 .  omeprazole (PRILOSEC) 40 MG capsule, Take 1 capsule (40 mg total) by mouth 2 (two) times daily before a meal., Disp: 180 capsule, Rfl:  1 .  tamsulosin (FLOMAX) 0.4 MG CAPS capsule, TAKE 1 CAPSULE (0.4 MG TOTAL) BY MOUTH DAILY AFTER BREAKFAST., Disp: 90 capsule, Rfl: 0  Depression screen [facility] 2/9 11/18/2018 08/10/2018 02/24/2017  Decreased Interest 1 1 0  Down, Depressed, Hopeless 0 1 0  PHQ - 2 Score 1 2 0  Altered sleeping 0 - 0  Tired, decreased energy 1 0 1  Change in appetite _0 Feeling bad or failure about yourself   0 0 0  Trouble concentrating 0 1 1  Moving slowly or fidgety/restless 0 0 1  Suicidal thoughts 0 0 1  PHQ-9 Score 3 - 5  Difficult doing work/chores Not difficult at all Not difficult at all -    No flowsheet data found.  -------------------------------------------------------------------------- O: No physical exam performed due to remote telephone encounter.   Lab results reviewed.  Recent Labs    08/16/18 0757 11/17/18 0810  HGBA1C 12.7* 11.0*     Recent Results (from the past 2160 hour(s))  Cologuard     Status: None   Collection Time: 08/23/18 12:00 AM  Result Value Ref Range   Cologuard Negative Negative  HgB A1c     Status: Abnormal   Collection Time: 11/17/18  8:10 AM  Result Value Ref Range   Hgb A1c MFr Bld 11.0 (H) <5.7 % of total Hgb    Comment: For someone without known diabetes, a hemoglobin A1c value of 6.5% or greater indicates that they may have  diabetes and this should be confirmed with a follow-up  test. . For someone with known diabetes, a value <7% indicates  that their diabetes is well controlled and a value  greater than or equal to 7% indicates suboptimal  control. A1c targets should be individualized based on  duration of diabetes, age, comorbid conditions, and  other considerations. . Currently, no consensus exists regarding use of hemoglobin A1c for diagnosis of diabetes for children. .    Mean Plasma Glucose 269 (calc)   eAG (mmol/L) 14.9 (calc)    -------------------------------------------------------------------------- A&P:  Problem List Items Addressed This Visit    Type 2 diabetes mellitus with hyperglycemia ([place])    Uncontrolled DM with hyperglycemia, slight improve A1c from 12.7 down to 11. poor diet/exercise Complications - other including hyperlipidemia, GERD, obesity - increases risk of future cardiovascular complications   Plan:  1. Restart plan from previous visit now met deductible - cost barrier is removed -  Sample x 3 of bydureon bcise pen, to pick up from office today - Notify office when ready for new rx for Bydureon BCise 63m weekly inj, prefer to order at end of May 2020 - Continue Metformin 10063mBID for now 2. Encourage improved lifestyle - low carb, low sugar diet, reduce portion size, continue improving regular exercise 3. Check CBG , bring log to next visit for review 4. Follow-up as scheduled 3 months DM A1c med adjust      Relevant Medications   Exenatide ER (BYDUREON BCISE) 2 MG/0.85ML AUIJ      Meds ordered this encounter  Medications  . Exenatide ER (BYDUREON BCISE) 2 MG/0.85ML AUIJ    Sig: Inject 2 mg into the skin once a week.    Dispense:  3 pen    Refill:  0    Follow-up: - Return in 3 months for DM A1c, med adjust  Patient verbalizes understanding with the above medical recommendations including the limitation of remote medical advice.  Specific follow-up and call-back criteria were  given for patient to follow-up or seek medical care more urgently if needed.   - Time spent in direct consultation with patient on phone: 14 minutes  [name], [facility] 11/18/2018, 8:56 AM

## 2018-11-21 ENCOUNTER — Ambulatory Visit: Payer: Managed Care, Other (non HMO) | Admitting: Family Medicine

## 2018-11-30 ENCOUNTER — Other Ambulatory Visit: Payer: Self-pay

## 2018-11-30 ENCOUNTER — Ambulatory Visit: Payer: Managed Care, Other (non HMO) | Admitting: Orthotics

## 2018-11-30 DIAGNOSIS — M722 Plantar fascial fibromatosis: Secondary | ICD-10-CM

## 2018-11-30 NOTE — Progress Notes (Signed)
Patient came in to day to pick up modified foot orthotic (R)..he still felt arfh was a bit low and I added Poron Scaphoid pad and skived distal end of shell to make feel better.Marland Kitchen

## 2018-12-02 ENCOUNTER — Other Ambulatory Visit: Payer: Self-pay | Admitting: Family Medicine

## 2018-12-02 DIAGNOSIS — E119 Type 2 diabetes mellitus without complications: Secondary | ICD-10-CM

## 2018-12-02 DIAGNOSIS — K219 Gastro-esophageal reflux disease without esophagitis: Secondary | ICD-10-CM

## 2018-12-06 ENCOUNTER — Other Ambulatory Visit: Payer: Self-pay | Admitting: Family Medicine

## 2018-12-06 ENCOUNTER — Other Ambulatory Visit: Payer: Self-pay | Admitting: Podiatry

## 2018-12-06 DIAGNOSIS — K219 Gastro-esophageal reflux disease without esophagitis: Secondary | ICD-10-CM

## 2018-12-06 DIAGNOSIS — F3341 Major depressive disorder, recurrent, in partial remission: Secondary | ICD-10-CM

## 2018-12-06 DIAGNOSIS — E785 Hyperlipidemia, unspecified: Secondary | ICD-10-CM

## 2018-12-06 DIAGNOSIS — E1169 Type 2 diabetes mellitus with other specified complication: Secondary | ICD-10-CM

## 2018-12-21 ENCOUNTER — Telehealth: Payer: Self-pay | Admitting: Family Medicine

## 2018-12-21 DIAGNOSIS — K219 Gastro-esophageal reflux disease without esophagitis: Secondary | ICD-10-CM

## 2018-12-21 DIAGNOSIS — E1165 Type 2 diabetes mellitus with hyperglycemia: Secondary | ICD-10-CM

## 2018-12-21 MED ORDER — OMEPRAZOLE 40 MG PO CPDR: 40.0000 mg | DELAYED_RELEASE_CAPSULE | Freq: Two times a day (BID) | ORAL | 1 refills | Status: DC

## 2018-12-21 MED ORDER — EXENATIDE ER 2 MG/0.85ML ~~LOC~~ AUIJ: 2.0000 mg | AUTO-INJECTOR | SUBCUTANEOUS | 3 refills | Status: DC

## 2018-12-21 NOTE — Telephone Encounter (Signed)
Pt. Called requesting refill on  Bydurean,  Omeprazole  Called into  [facility].

## 2019-02-05 ENCOUNTER — Other Ambulatory Visit: Payer: Self-pay | Admitting: Family Medicine

## 2019-02-05 DIAGNOSIS — N138 Other obstructive and reflux uropathy: Secondary | ICD-10-CM

## 2019-02-05 DIAGNOSIS — N401 Enlarged prostate with lower urinary tract symptoms: Secondary | ICD-10-CM

## 2019-03-31 ENCOUNTER — Other Ambulatory Visit: Payer: Self-pay | Admitting: Podiatry

## 2019-04-04 ENCOUNTER — Encounter: Payer: Self-pay | Admitting: Family Medicine

## 2019-04-04 ENCOUNTER — Ambulatory Visit (INDEPENDENT_AMBULATORY_CARE_PROVIDER_SITE_OTHER): Payer: Managed Care, Other (non HMO) | Admitting: Family Medicine

## 2019-04-04 ENCOUNTER — Other Ambulatory Visit: Payer: Self-pay

## 2019-04-04 VITALS — BP 122/74 | HR 84 | Temp 98.5°F | Resp 16 | Ht 70.0 in | Wt 269.0 lb

## 2019-04-04 DIAGNOSIS — M79602 Pain in left arm: Secondary | ICD-10-CM | POA: Diagnosis not present

## 2019-04-04 DIAGNOSIS — M7712 Lateral epicondylitis, left elbow: Secondary | ICD-10-CM

## 2019-04-04 DIAGNOSIS — M79601 Pain in right arm: Secondary | ICD-10-CM

## 2019-04-04 DIAGNOSIS — R202 Paresthesia of skin: Secondary | ICD-10-CM

## 2019-04-04 MED ORDER — GABAPENTIN 100 MG PO CAPS: ORAL_CAPSULE | ORAL | 1 refills | Status: DC

## 2019-04-04 MED ORDER — PREDNISONE 10 MG PO TABS: ORAL_TABLET | ORAL | 0 refills | Status: DC

## 2019-04-04 NOTE — Patient Instructions (Addendum)
Thank you for coming to the office today.  You most likely have "Tennis Elbow" or "Lateral Epicondylitis" - This is inflammation or tendonitis affecting the muscles of your forearm - Usually it is caused by frequent or daily repetitive activities using your arms, lifting, rotating, pulling, twisting - it can happen over days to months or longer from repetitive strain  One of the most important parts of treatment is rest and avoiding the activities that make it worse.  Start Prednisone steroid course over 6 days taper down 60 to 10mg  - take 1 pill daily with food  While on prednisone you cannot take Meloxicam, Advil Aleve ibuprofen.  After Prednisone we can resume - Meloxicam tabs 15mg  daily as needed. - DO NOT TAKE any ibuprofen, aleve, motrin while you are taking this medicine - It is safe to take Tylenol Ext Str 500mg  tabs - take 1 to 2 (max dose 1000mg ) every 6 hours as needed for breakthrough pain, max 24 hour daily dose is 6 to 8 tablets or 4000mg   Start Gabapentin 100mg  capsules, take at night for 2-3 nights only, and then increase to 2 times a day for a few days, and then may increase to 3 times a day, it may make you drowsy, if helps significantly at night only, then you can increase instead to 3 capsules at night, instead of 3 times a day - In the future if needed, we can significantly increase the dose if tolerated well, some common doses are 300mg  three times a day up to 600mg  three times a day, usually it takes several weeks or months to get to higher doses   Use RICE therapy: - R - Rest / relative rest with activity modification avoid overuse of joint - I - Ice packs (make sure you use a towel or sock / something to protect skin) - C - Compression with ACE wrap to apply pressure and reduce swelling allowing more support  Try a Forearm Tennis Elbow Strap over muscle as demonstrated - use with repetitive activity to reduce strain of muscle    - E - Elevation - if significant  swelling, lift leg above heart level (toes above your nose) to help reduce swelling, most helpful at night after day of being on your feet  May consider Physical Therapy referral if interested   Please schedule a Follow-up Appointment to: Return in about 4 weeks (around 05/02/2019) for 4-6 weeks for DM A1c, Left arm pain follow-up.  If you have any other questions or concerns, please feel free to call the office or send a message through [place]. You may also schedule an earlier appointment if necessary.  Additionally, you may be receiving a survey about your experience at our office within a few days to 1 week by e-mail or mail. We value your feedback.  [name], DO [place]

## 2019-04-04 NOTE — Progress Notes (Signed)
Subjective:    Patient ID: Shawn Gordon, male    DOB: 1961/03/03, 58 y.o.   MRN: [id number]  Shawn Gordon is a 58 y.o. male presenting on 04/04/2019 for Arm Pain (shoulder pain worst on Left side)   HPI   Bilateral Upper Extremity Paresthesia / Pain Reports symptoms started over past 3 mounts about on average, he describes history back 3-4 months ago issue with his chair at home broke and it was sitting in it lower, and then seemed to put more pressure getting out of chair while using his arms / elbows and shoulders to push out of chair more. He did fix this and got rid of chair and the problem has persisted. - Pain described L worse than R, it radiates into arm, and into palm of hand not fingers. Seems episodic worsening. He says some pain on both sides, describing sharp pain, knife-like radiation worse in morning worse if sleep on that side on arm. Has pain mostly constant, and can get worsening with certain activity, has some tingling and paresthesia. - No prior history of similar issue. Or other neck or shoulder related. - He is taking Meloxicam 15mg  every other day with some relief, if not taking it can worsen he was taking it from Dr Amalia Hailey for his feet, plantar fasciitis. Denies any persistent numbness tingling weakness, bruising, redness fever chills other joint pain  Depression screen [facility] 2/9 11/18/2018 08/10/2018 02/24/2017  Decreased Interest 1 1 0  Down, Depressed, Hopeless 0 1 0  PHQ - 2 Score 1 2 0  Altered sleeping 0 - 0  Tired, decreased energy 1 0 1  Change in appetite 1 2 1   Feeling bad or failure about yourself  0 0 0  Trouble concentrating 0 1 1  Moving slowly or fidgety/restless 0 0 1  Suicidal thoughts 0 0 1  PHQ-9 Score 3 - 5  Difficult doing work/chores Not difficult at all Not difficult at all -    Social History   Tobacco Use  . Smoking status: Former Smoker    Packs/day: 2.00    Types: Cigarettes    Start date: 04/19/1978    Quit date: 02/26/2001    Years since quitting: 18.1  . Smokeless tobacco: Former Network engineer Use Topics  . Alcohol use: No    Alcohol/week: 0.0 standard drinks  . Drug use: No    Review of Systems Per HPI unless specifically indicated above     Objective:    BP 122/74   Pulse 84   Temp 98.5 F (36.9 C) (Oral)   Resp 16   Ht 5\' 10"  (1.778 m)   Wt 269 lb (122 kg)   BMI 38.60 kg/m   Wt Readings from Last 3 Encounters:  04/04/19 269 lb (122 kg)  08/22/18 266 lb 9.6 oz (120.9 kg)  08/16/18 261 lb 12.8 oz (118.8 kg)    Physical Exam Vitals signs and nursing note reviewed.  Constitutional:      General: He is not in acute distress.    Appearance: He is well-developed. He is not diaphoretic.     Comments: Well-appearing, comfortable, cooperative  HENT:     Head: Normocephalic and atraumatic.  Eyes:     General:        Right eye: No discharge.        Left eye: No discharge.     Conjunctiva/sclera: Conjunctivae normal.  Neck:     Comments: Neck Inspection: normal appearance Palpation: non  tender ROM: full active ROM Special Testing: Spurling's negative for radiculopathy Cardiovascular:     Rate and Rhythm: Normal rate.  Pulmonary:     Effort: Pulmonary effort is normal.  Musculoskeletal:     Comments: Left Shoulder Inspection: Normal appearance bilateral symmetrical Palpation: Non-tender to palpation over anterior, lateral, or posterior shoulder  ROM: Full intact active ROM forward flexion, abduction, internal / external rotation, symmetrical - reproduced some discomfort at abduction range Special Testing: Rotator cuff testing negative for weakness with supraspinatus full can and empty can test, Hawkin's AC impingement positive for pain Strength: Normal strength 5/5 flex/ext, ext rot / int rot, grip, rotator cuff str testing. Neurovascular: Distally intact pulses, sensation to light touch   Skin:    General: Skin is warm and dry.     Findings: No erythema or rash.  Neurological:      Mental Status: He is alert and oriented to person, place, and time.  Psychiatric:        Behavior: Behavior normal.     Comments: Well groomed, good eye contact, normal speech and thoughts    Results for orders placed or performed in visit on 11/17/18  HgB A1c  Result Value Ref Range   Hgb A1c MFr Bld 11.0 (H) <5.7 % of total Hgb   Mean Plasma Glucose 269 (calc)   eAG (mmol/L) 14.9 (calc)      Assessment & Plan:   Problem List Items Addressed This Visit    None    Visit Diagnoses    Left lateral epicondylitis    -  Primary   Relevant Medications   predniSONE (DELTASONE) 10 MG tablet   gabapentin (NEURONTIN) 100 MG capsule   Paresthesia of left arm       Relevant Medications   predniSONE (DELTASONE) 10 MG tablet   gabapentin (NEURONTIN) 100 MG capsule   Bilateral arm pain          Clinically with bilateral L > R upper extremity pain, seems to be generated from nerve pain paresthesia, may have strained or pinched nerve. No other injury - seems rotator cuff muscles intact and strength is intact. Not weakness. Not reduced ROM , less likely bursitis or other arthritis condition. Additionally likely has repetitive component may be tennis elbow lateral epicondylitis as well  Plan - Start Prednisone taper 60 to 10mg  over 6 days, caution with T2DM - emphasized he is overdue for A1c needs to return for DM Visit - Hold NSAID then restart after finish pred - add gabapentin 100mg  up to 300mg  titrate, likely nightly only - Defer X-ray at this time - Avoid reinjury Also if possible tennis elbow can try forearm strap, advice per AVS Follow up if not improving, consider refer to PT vs ORtho   Meds ordered this encounter  Medications  . predniSONE (DELTASONE) 10 MG tablet    Sig: Take 6 tabs with breakfast Day 1, 5 tabs Day 2, 4 tabs Day 3, 3 tabs Day 4, 2 tabs Day 5, 1 tab Day 6.    Dispense:  21 tablet    Refill:  0  . gabapentin (NEURONTIN) 100 MG capsule    Sig: Start 1 capsule  daily, increase by 1 cap every 2-3 days as tolerated up to 3 times a day, or may take 3 at once in evening.    Dispense:  90 capsule    Refill:  1     Follow up plan: Return in about 4 weeks (around 05/02/2019) for 4-6  weeks for DM A1c, Left arm pain follow-up.  [name], [facility] 04/04/2019, 3:45 PM

## 2019-04-14 ENCOUNTER — Telehealth: Payer: Self-pay | Admitting: Family Medicine

## 2019-04-14 DIAGNOSIS — M7712 Lateral epicondylitis, left elbow: Secondary | ICD-10-CM

## 2019-04-14 DIAGNOSIS — M79601 Pain in right arm: Secondary | ICD-10-CM

## 2019-04-14 DIAGNOSIS — M79602 Pain in left arm: Secondary | ICD-10-CM

## 2019-04-14 DIAGNOSIS — R202 Paresthesia of skin: Secondary | ICD-10-CM

## 2019-04-14 MED ORDER — CYCLOBENZAPRINE HCL 10 MG PO TABS: 10.0000 mg | ORAL_TABLET | Freq: Three times a day (TID) | ORAL | 2 refills | Status: DC | PRN

## 2019-04-14 NOTE — Telephone Encounter (Signed)
As per patient prednisone worked very well but gabapentin is not helping please suggest?

## 2019-04-14 NOTE — Telephone Encounter (Signed)
Called patient.  Initially much improved on prednisone week but now symptoms back to previous.  gabapentin up to 300mg  dose is ineffective, then we can send a Flexeril muscle relaxant to take as needed for pain, usually preferred in evening. It can cause sedation. It should relieve pressure on the nerves that I believe are causing his pain.  He may continue Meloxicam.  We cannot order more prednisone since usually we are limited to only 1 week at a time for that treatment.  I would also place referral to [facility] for nerve pain.  ----  Rx ordered flexeril. He was not on meloxicam, he can resume that. Referral placed.  [name], [facility] 04/14/2019, 12:55 PM

## 2019-04-14 NOTE — Telephone Encounter (Signed)
Pt was given two medications, says one is not helping at all and he is a lot of pain would like something else called in.  gabapentin (NEURONTIN) 100 MG capsule [id number]

## 2019-04-30 ENCOUNTER — Other Ambulatory Visit: Payer: Self-pay | Admitting: Family Medicine

## 2019-04-30 DIAGNOSIS — N138 Other obstructive and reflux uropathy: Secondary | ICD-10-CM

## 2019-05-08 ENCOUNTER — Other Ambulatory Visit: Payer: Self-pay | Admitting: Family Medicine

## 2019-05-08 ENCOUNTER — Other Ambulatory Visit: Payer: Self-pay

## 2019-05-08 ENCOUNTER — Encounter: Payer: Self-pay | Admitting: Family Medicine

## 2019-05-08 ENCOUNTER — Ambulatory Visit (INDEPENDENT_AMBULATORY_CARE_PROVIDER_SITE_OTHER): Payer: Managed Care, Other (non HMO) | Admitting: Family Medicine

## 2019-05-08 DIAGNOSIS — R52 Pain, unspecified: Secondary | ICD-10-CM | POA: Diagnosis not present

## 2019-05-08 DIAGNOSIS — E1165 Type 2 diabetes mellitus with hyperglycemia: Secondary | ICD-10-CM

## 2019-05-08 DIAGNOSIS — R5383 Other fatigue: Secondary | ICD-10-CM | POA: Diagnosis not present

## 2019-05-08 DIAGNOSIS — R6889 Other general symptoms and signs: Secondary | ICD-10-CM | POA: Diagnosis not present

## 2019-05-08 NOTE — Progress Notes (Signed)
Virtual Visit via Telephone The purpose of this virtual visit is to provide medical care while limiting exposure to the novel coronavirus (COVID19) for both patient and office staff.  Consent was obtained for phone visit:  Yes.   Answered questions that patient had about telehealth interaction:  Yes.   I discussed the limitations, risks, security and privacy concerns of performing an evaluation and management service by telephone. I also discussed with the patient that there may be a patient responsible charge related to this service. The patient expressed understanding and agreed to proceed.  Patient Location: Home Provider Location: [facility])   ---------------------------------------------------------------------- Chief Complaint  Patient presents with  . Fatigue    onset 3 days bodyache, fatigue but yesterday felt improved but fatiguness is coming back today denies cough, chills or SOB or fever    S: Reviewed CMA documentation. I have called patient and gathered additional HPI as follows:  FATIGUE / BODY ACHES Reports that symptoms started 05/05/19 he woke up with feeling a "rock in stomach" something that did not digest well, he called out sick, then slept a lot, next day he was very sore as if he slept too much, he was not very active over weekend, and did not feel any significant problem, has not eaten as much lately due to trying to diet. Sunday he felt better. Today he felt much better, went to work, then suddenly around 1130am he felt very sore and tired again as if he was developing flu like symptoms similar to past experiences. - No new exposure to COVID or Flu - He has not taken OTC medication  Currently out of work Denies any high risk travel to areas of current concern for [place]. Denies any known or suspected exposure to person with or possibly with COVID19.  Admits body ache Denies any fevers, chills, sweats, cough, shortness of breath, sinus  pain or pressure, headache, abdominal pain, diarrhea  -------------------------------------------------------------------------- O: No physical exam performed due to remote telephone encounter.  -------------------------------------------------------------------------- A&P:   Possible COVID19 vs Flu-like Syndrome early Afebrile Concerning prodromal symptoms, without obvious exposure No other obvious clinical cause based on current trend and symptoms - Reassuring without high risk symptoms - Afebrile, without dyspnea - No comorbid pulmonary conditions (asthma, COPD) or immunocompromise  Recommend COVID19 testing to be performed ASAP, location recommended [facility] test site [facility]), advised patient info on test site and procedures for testing, first come first serve, no apt needed - next available is TOMORROW Tues 10/20 See quarantine recommendations below Out of work until test result, if negative will need work note to return OTC medications recommended for symptoms. Recommend Tylenol PRN for fever or other viral symptoms  IF COVID Test negative OR if he develop active flu symptoms in 24-48 hours - he can notify us and we will likely PROCEED with treating Influenza with Xofluza or Tamiflu without flu testing acutely at this time.  Return to care precautions given.  No orders of the defined types were placed in this encounter.   REQUIRED self quarantine to [place] - advised to avoid all exposure with others while during TESTING (Pending result) and treatment. Should continue to quarantine for up to 7-14 days - pending resolution of symptoms, if TEST IS NEGATIVE and symptoms resolve by 7 days and is afebrile >3 days - may STOP self quarantine at that time. IF test is POSITIVE then will require 10 additional day quarantine after date of positive test  result.  If symptoms do not resolve or significantly improve OR if WORSENING - fever / cough - or worsening  shortness of breath - then should contact us and seek advice on next steps in treatment at home vs where/when to seek care at Urgent Care or Hospital ED for further intervention and possible testing if indicated.  Patient verbalizes understanding with the above medical recommendations including the limitation of remote medical advice.  Specific follow-up / call-back criteria were given for patient to follow-up or seek medical care more urgently if needed.   - Time spent in direct consultation with patient on phone: 9 minutes  [name], [facility] 05/08/2019, 4:17 PM

## 2019-05-08 NOTE — Patient Instructions (Addendum)
Thank you for coming to the office today.  Possible Flu - We will consider treatment for Flu in next few days if you worsen.  You may have coronavirus / [place] Testing Information  All you need to do is arrive at a testing site. No appointment needed.  Hours (Open 8 a.m. - 3:45 p.m.) LAST TEST completed at 3:30pm  [facility] [place]: [facility] Entrance - [place], [place]: [place], [place], [place], [place] (entrance off [address])  [place]: [place] Main [address] [place], [place] (across from [facility])  Test result may take 2-7 days to result. You will be notified by MyChart or by Phone.  Phone: [phone] [facility] contact, can inquire about status of test result)  If negative test - they will call you with result. If abnormal or positive test you will be notified as well and our office will contact you to help further with treatment plan.  May take Tylenol as needed for aches pains and fever. Prefer to avoid Ibuprofen if can help it, to avoid complication from virus.  REQUIRED self quarantine to [place] - advised to avoid all exposure with others while during treatment. Should continue to quarantine for up to 7-14 days, pending resolution of symptoms, if symptoms resolve by 7 days and is afebrile >3 days - may STOP self quarantine at that time.  If symptoms do not resolve or significantly improve OR if WORSENING - fever / cough - or worsening shortness of breath - then should contact us and seek advice on next steps in treatment at home vs where/when to seek care at Urgent Care or Hospital ED for further intervention   Please schedule a Follow-up Appointment to: Return in about 1 week (around 05/15/2019), or if symptoms worsen or fail to improve, for flu like or covid.  If you have any other questions or concerns, please feel free to call  the office or send a message through [place]. You may also schedule an earlier appointment if necessary.  Additionally, you may be receiving a survey about your experience at our office within a few days to 1 week by e-mail or mail. We value your feedback.  [name], DO [place]

## 2019-05-09 ENCOUNTER — Other Ambulatory Visit: Payer: Self-pay | Admitting: *Deleted

## 2019-05-09 DIAGNOSIS — Z20822 Contact with and (suspected) exposure to covid-19: Secondary | ICD-10-CM

## 2019-05-11 LAB — NOVEL CORONAVIRUS, NAA: SARS-CoV-2, NAA: NOT DETECTED

## 2019-05-15 ENCOUNTER — Ambulatory Visit (INDEPENDENT_AMBULATORY_CARE_PROVIDER_SITE_OTHER): Payer: Managed Care, Other (non HMO) | Admitting: Family Medicine

## 2019-05-15 ENCOUNTER — Encounter: Payer: Self-pay | Admitting: Family Medicine

## 2019-05-15 ENCOUNTER — Other Ambulatory Visit: Payer: Self-pay

## 2019-05-15 VITALS — BP 122/65 | HR 98 | Ht 70.0 in | Wt 264.4 lb

## 2019-05-15 DIAGNOSIS — R202 Paresthesia of skin: Secondary | ICD-10-CM

## 2019-05-15 DIAGNOSIS — G8929 Other chronic pain: Secondary | ICD-10-CM

## 2019-05-15 DIAGNOSIS — E1165 Type 2 diabetes mellitus with hyperglycemia: Secondary | ICD-10-CM

## 2019-05-15 DIAGNOSIS — F3342 Major depressive disorder, recurrent, in full remission: Secondary | ICD-10-CM | POA: Diagnosis not present

## 2019-05-15 DIAGNOSIS — M25512 Pain in left shoulder: Secondary | ICD-10-CM

## 2019-05-15 DIAGNOSIS — M25511 Pain in right shoulder: Secondary | ICD-10-CM

## 2019-05-15 LAB — POCT GLYCOSYLATED HEMOGLOBIN (HGB A1C): Hemoglobin A1C: 8.3 % — AB (ref 4.0–5.6)

## 2019-05-15 MED ORDER — BYDUREON BCISE 2 MG/0.85ML ~~LOC~~ AUIJ: 2.0000 mg | AUTO-INJECTOR | SUBCUTANEOUS | 1 refills | Status: DC

## 2019-05-15 NOTE — Patient Instructions (Addendum)
Thank you for coming to the office today.  Start Cyclobenzapine (Flexeril) 10mg  tablets (muscle relaxant) - start with half (cut) to one whole pill at night for muscle relaxant - may make you sedated or sleepy (be careful driving or working on this)  Continue Meloxicam 15mg  daily for shoulders / foot - try to avoid Ibuprofen when taking Meloxicam.  If need to try something else - can try high dose for shoulders Recommend to start taking Tylenol Extra Strength 500mg  tabs - take 1 to 2 tabs per dose (max 1000mg ) every 6-8 hours for pain (take regularly, don't skip a dose for next 7 days), max 24 hour daily dose is 6 tablets or 3000mg . In the future you can repeat the same everyday Tylenol course for 1-2 weeks at a time.   I ordered Bydureon injection 90 day supply, check with pharmacy  Neurology 11/5  Call your Eye Doctor and ask if they can send [place] a copy of the Diabetic Eye Exam.  DUE for FASTING BLOOD WORK (no food or drink after midnight before the lab appointment, only water or coffee without cream/sugar on the morning of)  SCHEDULE "Lab Only" visit in the morning at the clinic for lab draw in 3 MONTHS   - Make sure Lab Only appointment is at about 1 week before your next appointment, so that results will be available  For Lab Results, once available within 2-3 days of blood draw, you can can log in to MyChart online to view your results and a brief explanation. Also, we can discuss results at next follow-up visit.   Please schedule a Follow-up Appointment to: Return in about 3 months (around 08/15/2019) for Annual Physical.  If you have any other questions or concerns, please feel free to call the office or send a message through [place]. You may also schedule an earlier appointment if necessary.  Additionally, you may be receiving a survey about your experience at our office within a few days to 1 week by e-mail or mail. We value your feedback.  [name], DO [place]

## 2019-05-15 NOTE — Progress Notes (Signed)
Subjective:    Patient ID: Shawn Gordon, male    DOB: 06/11/1961, 58 y.o.   MRN: [id number]  Shawn Gordon is a 58 y.o. male presenting on 05/15/2019 for Diabetes (pt state the gabapentin doesn't help relieve the shoulder pain)   HPI   CHRONIC DM, Type 2with hyperglycemia, uncontrolled / Morbid Obesity  Due for A1c today, prior 12-11 due to off Bydureon due to high deductible ins plan, now back on and able to cover deductible Last dose bydureon today needs new rx Improved sugars again CBGs:Limited CBG results Meds:Metformin 1000mg  BID, Bydureon BCise 2mg  weekly inj Reports good compliance. Tolerating well w/o side-effects Currently on ACEi Lifestyle: Quit or reduced sandwiches, eliminated white bread Walking more, worse if uphill walking knees tired Weight down Denies hypoglycemia, polyuria, visual changes, numbness or tingling.  Follow-up L Epicondylitis / Bilateral Shoulder Pain vs Impingement Last visit 04/04/19, trial on prednisone burst, forearm strap and gabapentin Now resolved L epicondylitis, forearm strap much improved Still shoulders bothering him causing pain, h e was also referred in interval to [facility] for nerve conduction testing. Wake up with pain in shoulders soreness Taking gabapentin 300mg  nightly - limited results now. - No muscle spasms, he stopped taking Flexeril - Taking Meloxicam 15mg  daily for plantar fasciitis and helps shoulders - Apt Dr Lanelle Bal Neuro in 1 week No new injury trauma or weakness pain or swelling  Major Depression, recurrent chronic Currently doing very well, without any new concerns. In remission.   Health Maintenance: Due for Flu Shot, declines today despite counseling on benefits   Depression screen [facility] 2/9 05/08/2019 11/18/2018 08/10/2018  Decreased Interest 0 1 1  Down, Depressed, Hopeless 0 0 1  PHQ - 2 Score 0 1 2  Altered sleeping 0 0 -  Tired, decreased energy 0 1 0  Change in appetite 0 1 2  Feeling bad or  failure about yourself  0 0 0  Trouble concentrating 0 0 1  Moving slowly or fidgety/restless 0 0 0  Suicidal thoughts 0 0 0  PHQ-9 Score 0 3 -  Difficult doing work/chores Not difficult at all Not difficult at all Not difficult at all    Social History   Tobacco Use  . Smoking status: Former Smoker    Packs/day: 2.00    Types: Cigarettes    Start date: 04/19/1978    Quit date: 02/26/2001    Years since quitting: 18.2  . Smokeless tobacco: Former Network engineer Use Topics  . Alcohol use: No    Alcohol/week: 0.0 standard drinks  . Drug use: No    Review of Systems Per HPI unless specifically indicated above     Objective:    BP 122/65   Pulse 98   Ht 5\' 10"  (1.778 m)   Wt 264 lb 6.4 oz (119.9 kg)   BMI 37.94 kg/m   Wt Readings from Last 3 Encounters:  05/15/19 264 lb 6.4 oz (119.9 kg)  04/04/19 269 lb (122 kg)  08/22/18 266 lb 9.6 oz (120.9 kg)    Physical Exam Vitals signs and nursing note reviewed.  Constitutional:      General: He is not in acute distress.    Appearance: He is well-developed. He is not diaphoretic.     Comments: Well-appearing, comfortable, cooperative  HENT:     Head: Normocephalic and atraumatic.  Eyes:     General:        Right eye: No discharge.  Left eye: No discharge.     Conjunctiva/sclera: Conjunctivae normal.  Cardiovascular:     Rate and Rhythm: Normal rate.  Pulmonary:     Effort: Pulmonary effort is normal.  Musculoskeletal:     Comments: Good ROM bilateral shoulders, some reproduced symptoms lifting above head bilateral, no rotator cuff weak  Skin:    General: Skin is warm and dry.     Findings: No erythema or rash.  Neurological:     Mental Status: He is alert and oriented to person, place, and time.  Psychiatric:        Behavior: Behavior normal.     Comments: Well groomed, good eye contact, normal speech and thoughts      Recent Labs    08/16/18 0757 11/17/18 0810 05/15/19 1612  HGBA1C 12.7* 11.0* 8.3*     Results for orders placed or performed in visit on 05/15/19  POCT glycosylated hemoglobin (Hb A1C)  Result Value Ref Range   Hemoglobin A1C 8.3 (A) 4.0 - 5.6 %      Assessment & Plan:   Problem List Items Addressed This Visit    Type 2 diabetes mellitus with hyperglycemia ([place]) - Primary    Dramatic improvement on A1c from 11-12 down to 8.3 back on GLP1 Bydureon now. And improved lifestyle Complications - other including hyperlipidemia, GERD, obesity - increases risk of future cardiovascular complications   Plan:  1. Continue Bydureon BCise 2mg  weekly inj, new order 90 day supply, now able to get coverage for his high deductible - Continue Metformin 1000mg  BID for now 2. Encourage improved lifestyle - low carb, low sugar diet, reduce portion size, continue improving regular exercise 3. Check CBG , bring log to next visit for review - REcommend DM Eye exam 4. Follow-up as scheduled 4 months DM A1c med adjust      Relevant Medications   Exenatide ER (BYDUREON BCISE) 2 MG/0.85ML AUIJ   Other Relevant Orders   POCT glycosylated hemoglobin (Hb A1C) (Completed)   Morbid obesity (HCC)    BMI >37 with risk factors comorbid type 2 diabetes, and hyperlipidemia, meet criteria for morbid obesity dx  Encourage wt loss and lifestyle, on GLP1      Relevant Medications   Exenatide ER (BYDUREON BCISE) 2 MG/0.85ML AUIJ   Major depressive disorder, recurrent, in full remission ([place])    Stable controlled in remission       Other Visit Diagnoses    Paresthesia of left arm       Chronic pain of both shoulders       Relevant Medications   gabapentin (NEURONTIN) 100 MG capsule      #shoulder pain vs impingement Reassurance symptoms did improve, now lingering shoulder pain, paresthesia Follow up with [facility] Neuro as referred already, apt next week May stop gabapentin if ineffective Continue meloxicam Restart Flexeril QHS  Meds ordered this encounter  Medications  . Exenatide ER  (BYDUREON BCISE) 2 MG/0.85ML AUIJ    Sig: Inject 2 mg as directed once a week.    Dispense:  10.2 mL    Refill:  1    90 day supply requested      Follow up plan: Return in about 3 months (around 08/15/2019) for Annual Physical.  Future labs ordered for 07/2019  [name], DO [facility] 05/15/2019, 4:09 PM

## 2019-05-16 ENCOUNTER — Encounter: Payer: Self-pay | Admitting: Family Medicine

## 2019-05-16 ENCOUNTER — Other Ambulatory Visit: Payer: Self-pay | Admitting: Family Medicine

## 2019-05-16 DIAGNOSIS — E785 Hyperlipidemia, unspecified: Secondary | ICD-10-CM

## 2019-05-16 DIAGNOSIS — N401 Enlarged prostate with lower urinary tract symptoms: Secondary | ICD-10-CM

## 2019-05-16 DIAGNOSIS — E1165 Type 2 diabetes mellitus with hyperglycemia: Secondary | ICD-10-CM

## 2019-05-16 DIAGNOSIS — N138 Other obstructive and reflux uropathy: Secondary | ICD-10-CM

## 2019-05-16 DIAGNOSIS — E1169 Type 2 diabetes mellitus with other specified complication: Secondary | ICD-10-CM

## 2019-05-16 DIAGNOSIS — F3341 Major depressive disorder, recurrent, in partial remission: Secondary | ICD-10-CM

## 2019-05-16 DIAGNOSIS — Z0000 Encounter for general adult medical examination without abnormal findings: Secondary | ICD-10-CM

## 2019-05-16 NOTE — Assessment & Plan Note (Signed)
Stable controlled in remission

## 2019-05-16 NOTE — Assessment & Plan Note (Signed)
BMI >37 with risk factors comorbid type 2 diabetes, and hyperlipidemia, meet criteria for morbid obesity dx  Encourage wt loss and lifestyle, on GLP1 

## 2019-05-16 NOTE — Assessment & Plan Note (Addendum)
Dramatic improvement on A1c from 11-12 down to 8.3 back on GLP1 Bydureon now. And improved lifestyle Complications - other including hyperlipidemia, GERD, obesity - increases risk of future cardiovascular complications   Plan:  1. Continue Bydureon BCise 2mg  weekly inj, new order 90 day supply, now able to get coverage for his high deductible - Continue Metformin 1000mg  BID for now 2. Encourage improved lifestyle - low carb, low sugar diet, reduce portion size, continue improving regular exercise 3. Check CBG , bring log to next visit for review - REcommend DM Eye exam 4. Follow-up as scheduled 4 months DM A1c med adjust

## 2019-06-01 ENCOUNTER — Encounter: Payer: Self-pay | Admitting: Family Medicine

## 2019-06-03 ENCOUNTER — Other Ambulatory Visit: Payer: Self-pay | Admitting: Family Medicine

## 2019-06-03 DIAGNOSIS — F3341 Major depressive disorder, recurrent, in partial remission: Secondary | ICD-10-CM

## 2019-06-03 DIAGNOSIS — E119 Type 2 diabetes mellitus without complications: Secondary | ICD-10-CM

## 2019-06-03 DIAGNOSIS — E1169 Type 2 diabetes mellitus with other specified complication: Secondary | ICD-10-CM

## 2019-06-27 ENCOUNTER — Other Ambulatory Visit: Payer: Self-pay | Admitting: Family Medicine

## 2019-06-27 DIAGNOSIS — E119 Type 2 diabetes mellitus without complications: Secondary | ICD-10-CM

## 2019-06-27 DIAGNOSIS — K219 Gastro-esophageal reflux disease without esophagitis: Secondary | ICD-10-CM

## 2019-07-17 ENCOUNTER — Other Ambulatory Visit: Payer: Self-pay | Admitting: Orthopedic Surgery

## 2019-07-17 DIAGNOSIS — M25511 Pain in right shoulder: Secondary | ICD-10-CM

## 2019-07-23 ENCOUNTER — Other Ambulatory Visit: Payer: Self-pay

## 2019-07-23 ENCOUNTER — Ambulatory Visit
Admission: RE | Admit: 2019-07-23 | Discharge: 2019-07-23 | Disposition: A | Discharge status: Home / Self Care | Payer: Managed Care, Other (non HMO) | Source: Ambulatory Visit | Attending: Orthopedic Surgery | Admitting: Orthopedic Surgery

## 2019-07-23 DIAGNOSIS — M25512 Pain in left shoulder: Secondary | ICD-10-CM | POA: Insufficient documentation

## 2019-07-23 DIAGNOSIS — M25511 Pain in right shoulder: Secondary | ICD-10-CM | POA: Insufficient documentation

## 2019-07-24 ENCOUNTER — Other Ambulatory Visit: Payer: Self-pay | Admitting: Podiatry

## 2019-07-27 ENCOUNTER — Other Ambulatory Visit: Payer: Self-pay | Admitting: Nurse Practitioner

## 2019-07-27 DIAGNOSIS — N138 Other obstructive and reflux uropathy: Secondary | ICD-10-CM

## 2019-08-14 ENCOUNTER — Other Ambulatory Visit: Payer: Self-pay | Admitting: Orthopedic Surgery

## 2019-08-21 LAB — HM DIABETES EYE EXAM

## 2019-08-22 ENCOUNTER — Encounter: Payer: Self-pay | Admitting: Family Medicine

## 2019-08-28 ENCOUNTER — Other Ambulatory Visit: Payer: Self-pay

## 2019-08-28 ENCOUNTER — Encounter: Payer: Self-pay | Admitting: Orthopedic Surgery

## 2019-08-30 ENCOUNTER — Other Ambulatory Visit: Payer: Self-pay

## 2019-08-30 ENCOUNTER — Other Ambulatory Visit
Admission: RE | Admit: 2019-08-30 | Discharge: 2019-08-30 | Disposition: A | Discharge status: Home / Self Care | Payer: Managed Care, Other (non HMO) | Source: Ambulatory Visit | Attending: Orthopedic Surgery | Admitting: Orthopedic Surgery

## 2019-08-30 DIAGNOSIS — Z01812 Encounter for preprocedural laboratory examination: Secondary | ICD-10-CM | POA: Diagnosis present

## 2019-08-30 DIAGNOSIS — Z20822 Contact with and (suspected) exposure to covid-19: Secondary | ICD-10-CM | POA: Insufficient documentation

## 2019-08-30 LAB — SARS CORONAVIRUS 2 (TAT 6-24 HRS): SARS Coronavirus 2: NEGATIVE

## 2019-09-01 ENCOUNTER — Ambulatory Visit: Payer: Managed Care, Other (non HMO) | Admitting: Anesthesiology

## 2019-09-01 ENCOUNTER — Encounter: Payer: Self-pay | Admitting: Orthopedic Surgery

## 2019-09-01 ENCOUNTER — Other Ambulatory Visit: Payer: Self-pay

## 2019-09-01 ENCOUNTER — Encounter
Admission: RE | Disposition: A | Discharge status: Home / Self Care | Payer: Self-pay | Source: Home / Self Care | Attending: Orthopedic Surgery

## 2019-09-01 ENCOUNTER — Ambulatory Visit
Admission: RE | Admit: 2019-09-01 | Discharge: 2019-09-01 | Disposition: A | Discharge status: Home / Self Care | Payer: Managed Care, Other (non HMO) | Attending: Orthopedic Surgery | Admitting: Orthopedic Surgery

## 2019-09-01 DIAGNOSIS — M19012 Primary osteoarthritis, left shoulder: Secondary | ICD-10-CM | POA: Insufficient documentation

## 2019-09-01 DIAGNOSIS — J45909 Unspecified asthma, uncomplicated: Secondary | ICD-10-CM | POA: Insufficient documentation

## 2019-09-01 DIAGNOSIS — M7522 Bicipital tendinitis, left shoulder: Secondary | ICD-10-CM | POA: Diagnosis not present

## 2019-09-01 DIAGNOSIS — Z7984 Long term (current) use of oral hypoglycemic drugs: Secondary | ICD-10-CM | POA: Insufficient documentation

## 2019-09-01 DIAGNOSIS — M25812 Other specified joint disorders, left shoulder: Secondary | ICD-10-CM | POA: Diagnosis not present

## 2019-09-01 DIAGNOSIS — S43432A Superior glenoid labrum lesion of left shoulder, initial encounter: Secondary | ICD-10-CM | POA: Insufficient documentation

## 2019-09-01 DIAGNOSIS — E119 Type 2 diabetes mellitus without complications: Secondary | ICD-10-CM | POA: Insufficient documentation

## 2019-09-01 DIAGNOSIS — Z79899 Other long term (current) drug therapy: Secondary | ICD-10-CM | POA: Diagnosis not present

## 2019-09-01 DIAGNOSIS — Z6836 Body mass index (BMI) 36.0-36.9, adult: Secondary | ICD-10-CM | POA: Diagnosis not present

## 2019-09-01 DIAGNOSIS — M75122 Complete rotator cuff tear or rupture of left shoulder, not specified as traumatic: Secondary | ICD-10-CM | POA: Insufficient documentation

## 2019-09-01 DIAGNOSIS — X58XXXA Exposure to other specified factors, initial encounter: Secondary | ICD-10-CM | POA: Insufficient documentation

## 2019-09-01 DIAGNOSIS — K219 Gastro-esophageal reflux disease without esophagitis: Secondary | ICD-10-CM | POA: Insufficient documentation

## 2019-09-01 DIAGNOSIS — Z87891 Personal history of nicotine dependence: Secondary | ICD-10-CM | POA: Diagnosis not present

## 2019-09-01 DIAGNOSIS — M1990 Unspecified osteoarthritis, unspecified site: Secondary | ICD-10-CM | POA: Diagnosis not present

## 2019-09-01 DIAGNOSIS — F329 Major depressive disorder, single episode, unspecified: Secondary | ICD-10-CM | POA: Diagnosis not present

## 2019-09-01 HISTORY — DX: Presence of dental prosthetic device (complete) (partial): Z97.2

## 2019-09-01 HISTORY — PX: SHOULDER ARTHROSCOPY WITH SUBACROMIAL DECOMPRESSION, ROTATOR CUFF REPAIR AND BICEP TENDON REPAIR: SHX5687

## 2019-09-01 HISTORY — DX: Type 2 diabetes mellitus without complications: E11.9

## 2019-09-01 LAB — GLUCOSE, CAPILLARY
Glucose-Capillary: 129 mg/dL — ABNORMAL HIGH (ref 70–99)
Glucose-Capillary: 124 mg/dL — ABNORMAL HIGH (ref 70–99)

## 2019-09-01 SURGERY — SHOULDER ARTHROSCOPY WITH SUBACROMIAL DECOMPRESSION, ROTATOR CUFF REPAIR AND BICEP TENDON REPAIR
Anesthesia: General | Site: Shoulder | Laterality: Left

## 2019-09-01 MED ORDER — ONDANSETRON 4 MG PO TBDP: 4.0000 mg | ORAL_TABLET | Freq: Three times a day (TID) | ORAL | 0 refills | Status: DC | PRN

## 2019-09-01 MED ORDER — LACTATED RINGERS IR SOLN
Status: DC | PRN
  Administered 2019-09-01: 10000 mL

## 2019-09-01 MED ORDER — SODIUM CHLORIDE 0.9 % IV SOLN
INTRAVENOUS | Status: DC | PRN
  Administered 2019-09-01: 09:00:00 25 ug/min via INTRAVENOUS

## 2019-09-01 MED ORDER — ONDANSETRON HCL 4 MG/2ML IJ SOLN
INTRAMUSCULAR | Status: DC | PRN
  Administered 2019-09-01: 4 mg via INTRAVENOUS

## 2019-09-01 MED ORDER — ACETAMINOPHEN 500 MG PO TABS: 1000.0000 mg | ORAL_TABLET | Freq: Three times a day (TID) | ORAL | 2 refills | Status: AC

## 2019-09-01 MED ORDER — CHLORHEXIDINE GLUCONATE 4 % EX LIQD: 60.0000 mL | Freq: Once | CUTANEOUS | Status: DC

## 2019-09-01 MED ORDER — GLYCOPYRROLATE 0.2 MG/ML IJ SOLN
INTRAMUSCULAR | Status: DC | PRN
  Administered 2019-09-01: .2 mg via INTRAVENOUS

## 2019-09-01 MED ORDER — OXYCODONE HCL 5 MG PO TABS: 5.0000 mg | ORAL_TABLET | ORAL | 0 refills | Status: DC | PRN

## 2019-09-01 MED ORDER — ASPIRIN EC 325 MG PO TBEC: 325.0000 mg | DELAYED_RELEASE_TABLET | Freq: Every day | ORAL | 0 refills | Status: AC

## 2019-09-01 MED ORDER — PHENYLEPHRINE HCL (PRESSORS) 10 MG/ML IV SOLN
INTRAVENOUS | Status: DC | PRN
  Administered 2019-09-01 (×3): 100 ug via INTRAVENOUS
  Administered 2019-09-01: 200 ug via INTRAVENOUS
  Administered 2019-09-01: 100 ug via INTRAVENOUS
  Administered 2019-09-01 (×3): 200 ug via INTRAVENOUS
  Administered 2019-09-01 (×6): 100 ug via INTRAVENOUS
  Administered 2019-09-01 (×2): 200 ug via INTRAVENOUS
  Administered 2019-09-01 (×2): 100 ug via INTRAVENOUS
  Administered 2019-09-01: 200 ug via INTRAVENOUS

## 2019-09-01 MED ORDER — CEFAZOLIN SODIUM-DEXTROSE 2-4 GM/100ML-% IV SOLN
2.0000 g | INTRAVENOUS | Status: AC
  Administered 2019-09-01: 07:00:00 2 g via INTRAVENOUS

## 2019-09-01 MED ORDER — LACTATED RINGERS IV SOLN: INTRAVENOUS | Status: DC | PRN

## 2019-09-01 MED ORDER — EPHEDRINE SULFATE 50 MG/ML IJ SOLN
INTRAMUSCULAR | Status: DC | PRN
  Administered 2019-09-01: 25 mg via INTRAVENOUS
  Administered 2019-09-01 (×2): 10 mg via INTRAVENOUS

## 2019-09-01 MED ORDER — MIDAZOLAM HCL 2 MG/2ML IJ SOLN
INTRAMUSCULAR | Status: DC | PRN
  Administered 2019-09-01: 2 mg via INTRAVENOUS

## 2019-09-01 MED ORDER — BUPIVACAINE LIPOSOME 1.3 % IJ SUSP
INTRAMUSCULAR | Status: DC | PRN
  Administered 2019-09-01: 266 mg

## 2019-09-01 MED ORDER — LIDOCAINE HCL (CARDIAC) PF 100 MG/5ML IV SOSY
PREFILLED_SYRINGE | INTRAVENOUS | Status: DC | PRN
  Administered 2019-09-01: 30 mg via INTRATRACHEAL

## 2019-09-01 MED ORDER — PROPOFOL 10 MG/ML IV BOLUS
INTRAVENOUS | Status: DC | PRN
  Administered 2019-09-01: 200 mg via INTRAVENOUS

## 2019-09-01 MED ORDER — LACTATED RINGERS IV SOLN
INTRAVENOUS | Status: DC | PRN
  Administered 2019-09-01: 08:00:00 12000 mL

## 2019-09-01 MED ORDER — FENTANYL CITRATE (PF) 100 MCG/2ML IJ SOLN
INTRAMUSCULAR | Status: DC | PRN
  Administered 2019-09-01 (×2): 50 ug via INTRAVENOUS

## 2019-09-01 MED ORDER — BUPIVACAINE HCL (PF) 0.5 % IJ SOLN
INTRAMUSCULAR | Status: DC | PRN
  Administered 2019-09-01: 100 mg

## 2019-09-01 MED ORDER — DEXAMETHASONE SODIUM PHOSPHATE 4 MG/ML IJ SOLN
INTRAMUSCULAR | Status: DC | PRN
  Administered 2019-09-01: 4 mg via INTRAVENOUS

## 2019-09-01 SURGICAL SUPPLY — 57 items
ADAPTER IRRIG TUBE 2 SPIKE SOL (ADAPTER) ×6 IMPLANT
ANCHOR ICONIX SPEED 2.3 (Anchor) ×4 IMPLANT
ANCHOR ICONIX SPEED 2.3MM (Anchor) ×2 IMPLANT
ANCHOR SUT 4.75 DBL (Anchor) ×3 IMPLANT
BUR BR 5.5 12 FLUTE (BURR) ×3 IMPLANT
BUR RADIUS 4.0X18.5 (BURR) ×3 IMPLANT
CANNULA 5.75X7CM (CANNULA) ×1
CANNULA PART THRD DISP 5.75X7 (CANNULA) ×2 IMPLANT
CHLORAPREP W/TINT 26 (MISCELLANEOUS) ×3 IMPLANT
COOLER POLAR GLACIER W/PUMP (MISCELLANEOUS) ×3 IMPLANT
COVER LIGHT HANDLE UNIVERSAL (MISCELLANEOUS) ×6 IMPLANT
DERMABOND ADVANCED (GAUZE/BANDAGES/DRESSINGS) ×2
DERMABOND ADVANCED .7 DNX12 (GAUZE/BANDAGES/DRESSINGS) ×1 IMPLANT
DRAPE IMP U-DRAPE 54X76 (DRAPES) ×6 IMPLANT
DRAPE INCISE IOBAN 66X45 STRL (DRAPES) ×3 IMPLANT
DRAPE SHEET LG 3/4 BI-LAMINATE (DRAPES) ×3 IMPLANT
DRAPE U-SHAPE 48X52 POLY STRL (PACKS) ×6 IMPLANT
DRSG TEGADERM 4X4.75 (GAUZE/BANDAGES/DRESSINGS) ×9 IMPLANT
ELECT REM PT RETURN 9FT ADLT (ELECTROSURGICAL) ×3
ELECTRODE REM PT RTRN 9FT ADLT (ELECTROSURGICAL) ×1 IMPLANT
GAUZE XEROFORM 1X8 LF (GAUZE/BANDAGES/DRESSINGS) ×3 IMPLANT
GLOVE BIO SURGEON STRL SZ7.5 (GLOVE) ×6 IMPLANT
GLOVE BIOGEL PI IND STRL 8 (GLOVE) ×1 IMPLANT
GLOVE BIOGEL PI INDICATOR 8 (GLOVE) ×2
GOWN STRL REIN 2XL XLG LVL4 (GOWN DISPOSABLE) ×3 IMPLANT
GOWN STRL REUS W/ TWL LRG LVL3 (GOWN DISPOSABLE) ×1 IMPLANT
GOWN STRL REUS W/TWL LRG LVL3 (GOWN DISPOSABLE) ×2
IV LACTATED RINGER IRRG 3000ML (IV SOLUTION) ×16
IV LR IRRIG 3000ML ARTHROMATIC (IV SOLUTION) ×8 IMPLANT
KIT STABILIZATION SHOULDER (MISCELLANEOUS) ×3 IMPLANT
KIT TURNOVER KIT A (KITS) ×3 IMPLANT
MANIFOLD NEPTUNE II (INSTRUMENTS) ×3 IMPLANT
MASK FACE SPIDER DISP (MASK) ×3 IMPLANT
MAT GRAY ABSORB FLUID 28X50 (MISCELLANEOUS) ×6 IMPLANT
NDL SAFETY ECLIPSE 18X1.5 (NEEDLE) ×1 IMPLANT
NEEDLE HYPO 18GX1.5 SHARP (NEEDLE) ×2
NEEDLE SCORPION MULTI FIRE (NEEDLE) ×3 IMPLANT
PACK ARTHROSCOPY SHOULDER (MISCELLANEOUS) ×3 IMPLANT
PAD WRAPON POLAR SHDR UNIV (MISCELLANEOUS) ×1 IMPLANT
PENCIL SMOKE EVACUATOR (MISCELLANEOUS) ×3 IMPLANT
SET TUBE SUCT SHAVER OUTFL 24K (TUBING) ×3 IMPLANT
SET TUBE TIP INTRA-ARTICULAR (MISCELLANEOUS) ×3 IMPLANT
SPONGE GAUZE 2X2 8PLY STER LF (GAUZE/BANDAGES/DRESSINGS) ×2
SPONGE GAUZE 2X2 8PLY STRL LF (GAUZE/BANDAGES/DRESSINGS) ×4 IMPLANT
SUT ETHILON 3-0 FS-10 30 BLK (SUTURE) ×3
SUT MNCRL 4-0 (SUTURE) ×2
SUT MNCRL 4-0 27XMFL (SUTURE) ×1
SUT VIC AB 0 CT1 36 (SUTURE) ×3 IMPLANT
SUT VIC AB 2-0 CT2 27 (SUTURE) ×3 IMPLANT
SUTURE EHLN 3-0 FS-10 30 BLK (SUTURE) ×1 IMPLANT
SUTURE MNCRL 4-0 27XMF (SUTURE) ×1 IMPLANT
SYR 10ML LL (SYRINGE) ×3 IMPLANT
SYSTEM FBRTK BICEPS 1.9 DRILL (Anchor) ×3 IMPLANT
TAPE MICROFOAM 4IN (TAPE) ×3 IMPLANT
TUBING ARTHRO INFLOW-ONLY STRL (TUBING) ×3 IMPLANT
WAND WEREWOLF FLOW 90D (MISCELLANEOUS) ×3 IMPLANT
WRAPON POLAR PAD SHDR UNIV (MISCELLANEOUS) ×3

## 2019-09-01 NOTE — Anesthesia Procedure Notes (Signed)
Procedure Name: LMA Insertion Date/Time: 09/01/2019 7:44 AM Performed by: Cameron Ali, CRNA Pre-anesthesia Checklist: Patient identified, Emergency Drugs available, Suction available, Timeout performed and Patient being monitored Patient Re-evaluated:Patient Re-evaluated prior to induction Oxygen Delivery Method: Circle system utilized Preoxygenation: Pre-oxygenation with 100% oxygen Induction Type: IV induction LMA: LMA inserted LMA Size: 5.0 Number of attempts: 1 Placement Confirmation: positive ETCO2 and breath sounds checked- equal and bilateral Tube secured with: Tape Dental Injury: Teeth and Oropharynx as per pre-operative assessment

## 2019-09-01 NOTE — Transfer of Care (Signed)
Immediate Anesthesia Transfer of Care Note  Patient: Shawn Gordon  Procedure(s) Performed: SHOULDER ARTHROSCOPY WITH SUBACROMIAL DECOMPRESSION, ROTATOR CUFF REPAIR AND BICEP TENDON REPAIR, SUBSACAPULAIRS REPAIR, DISTAL CLAVICLE EXCISION (Left Shoulder)  Patient Location: PACU  Anesthesia Type: General  Level of Consciousness: awake, alert  and patient cooperative  Airway and Oxygen Therapy: Patient Spontanous Breathing and Patient connected to supplemental oxygen  Post-op Assessment: Post-op Vital signs reviewed, Patient's Cardiovascular Status Stable, Respiratory Function Stable, Patent Airway and No signs of Nausea or vomiting  Post-op Vital Signs: Reviewed and stable  Complications: No apparent anesthesia complications

## 2019-09-01 NOTE — H&P (Signed)
Paper H&P to be scanned into permanent record. H&P reviewed. No significant changes noted.  

## 2019-09-01 NOTE — Anesthesia Preprocedure Evaluation (Signed)
Anesthesia Evaluation  Patient identified by MRN, date of birth, ID band Patient awake    History of Anesthesia Complications Negative for: history of anesthetic complications  Airway Mallampati: III  TM Distance: >3 FB Neck ROM: Full    Dental no notable dental hx.    Pulmonary asthma , former smoker,    Pulmonary exam normal        Cardiovascular Exercise Tolerance: Good negative cardio ROS Normal cardiovascular exam     Neuro/Psych PSYCHIATRIC DISORDERS Depression    GI/Hepatic Neg liver ROS, GERD  Medicated and Controlled,  Endo/Other  diabetes, Well Controlled, Type 2, Oral Hypoglycemic AgentsMorbid obesity  Renal/GU negative Renal ROS     Musculoskeletal negative musculoskeletal ROS (+)   Abdominal   Peds  Hematology   Anesthesia Other Findings   Reproductive/Obstetrics                             Anesthesia Physical Anesthesia Plan  ASA: II  Anesthesia Plan: General   Post-op Pain Management:    Induction: Intravenous  PONV Risk Score and Plan: 2 and Midazolam and Treatment may vary due to age or medical condition  Airway Management Planned: LMA  Additional Equipment: None  Intra-op Plan:   Post-operative Plan:   Informed Consent: I have reviewed the patients History and Physical, chart, labs and discussed the procedure including the risks, benefits and alternatives for the proposed anesthesia with the patient or authorized representative who has indicated his/her understanding and acceptance.       Plan Discussed with: CRNA  Anesthesia Plan Comments:         Anesthesia Quick Evaluation

## 2019-09-01 NOTE — Anesthesia Procedure Notes (Signed)
Anesthesia Regional Block: Interscalene brachial plexus block   Pre-Anesthetic Checklist: ,, timeout performed, Correct Patient, Correct Site, Correct Laterality, Correct Procedure, Correct Position, site marked, Risks and benefits discussed,  Surgical consent,  Pre-op evaluation,  At surgeon's request and post-op pain management  Laterality: Left  Prep: chloraprep       Needles:  Injection technique: Single-shot  Needle Type: Stimiplex     Needle Length: 10cm  Needle Gauge: 21     Additional Needles:   Procedures:,,,, ultrasound used (permanent image in chart),,,,  Narrative:  Start time: 09/01/2019 7:01 AM End time: 09/01/2019 7:07 AM Injection made incrementally with aspirations every 5 mL.  Performed by: Personally   Additional Notes: Functioning IV was confirmed and monitors applied. Ultrasound guidance: relevant anatomy identified, needle position confirmed, local anesthetic spread visualized around nerve(s)., vascular puncture avoided.  Image printed for medical record.  Negative aspiration and no paresthesias; incremental administration of local anesthetic. The patient tolerated the procedure well. Vitals signes recorded in RN notes.

## 2019-09-01 NOTE — Discharge Instructions (Signed)
Post-Op Instructions - Rotator Cuff Repair  1. Bracing: You will wear a shoulder immobilizer or sling for 6 weeks.   2. Driving: No driving for 3 weeks post-op. When driving, do not wear the immobilizer. Ideally, we recommend no driving for 6 weeks while sling is in place as one arm will be immobilized.   3. Activity: No active lifting for 2 months. Wrist, hand, and elbow motion only. Avoid lifting the upper arm away from the body except for hygiene. You are permitted to bend and straighten the elbow passively only (no active elbow motion). You may use your hand and wrist for typing, writing, and managing utensils (cutting food). Do not lift more than a coffee cup for 8 weeks.  When sleeping or resting, inclined positions (recliner chair or wedge pillow) and a pillow under the forearm for support may provide better comfort for up to 4 weeks.  Avoid long distance travel for 4 weeks.  Return to normal activities after rotator cuff repair repair normally takes 6 months on average. If rehab goes very well, may be able to do most activities at 4 months, except overhead or contact sports.  4. Physical Therapy: Begins 3-4 days after surgery, and proceed 1 time per week for the first 6 weeks, then 1-2 times per week from weeks 6-20 post-op.  5. Medications:  - You will be provided a prescription for narcotic pain medicine. After surgery, take 1-2 narcotic tablets every 4 hours if needed for severe pain.  - A prescription for anti-nausea medication will be provided in case the narcotic medicine causes nausea - take 1 tablet every 6 hours only if nauseated.   - Take tylenol 1000 mg (2 Extra Strength tablets or 3 regular strength) every 8 hours for pain.  May decrease or stop tylenol 5 days after surgery if you are having minimal pain. - Take ASA '325mg'$ /day x 2 weeks to help prevent DVTs/PEs (blood clots).  - DO NOT take ANY nonsteroidal anti-inflammatory pain medications (Advil, Motrin, Ibuprofen, Aleve,  Naproxen, or Naprosyn). These medicines can inhibit healing of your shoulder repair.    If you are taking prescription medication for anxiety, depression, insomnia, muscle spasm, chronic pain, or for attention deficit disorder, you are advised that you are at a higher risk of adverse effects with use of narcotics post-op, including narcotic addiction/dependence, depressed breathing, death. If you use non-prescribed substances: alcohol, marijuana, cocaine, heroin, methamphetamines, etc., you are at a higher risk of adverse effects with use of narcotics post-op, including narcotic addiction/dependence, depressed breathing, death. You are advised that taking > 50 morphine milligram equivalents (MME) of narcotic pain medication per day results in twice the risk of overdose or death. For your prescription provided: oxycodone 5 mg - taking more than 6 tablets per day would result in > 50 morphine milligram equivalents (MME) of narcotic pain medication. Be advised that we will prescribe narcotics short-term, for acute post-operative pain only - 3 weeks for major operations such as shoulder repair/reconstruction surgeries.    6. Post-Op Appointment:  Your first post-op appointment will be 10-14 days post-op.  7. Work or School: For most, but not all procedures, we advise staying out of work or school for at least 1 to 2 weeks in order to recover from the stress of surgery and to allow time for healing.   If you need a work or school note this can be provided.   8. Smoking: If you are a smoker, you need to refrain from smoking  in the postoperative period. The nicotine in cigarettes will inhibit healing of your shoulder repair and decrease the chance of successful repair. Similarly, nicotine containing products (gum, patches) should be avoided.   Post-operative Brace: Apply and remove the brace you received as you were instructed to at the time of fitting and as described in detail as the brace's  instructions for use indicate.  Wear the brace for the period of time prescribed by your physician.  The brace can be cleaned with soap and water and allowed to air dry only.  Should the brace result in increased pain, decreased feeling (numbness/tingling), increased swelling or an overall worsening of your medical condition, please contact your doctor immediately.  If an emergency situation occurs as a result of wearing the brace after normal business hours, please dial 911 and seek immediate medical attention.  Let your doctor know if you have any further questions about the brace issued to you. Refer to the shoulder sling instructions for use if you have any questions regarding the correct fit of your shoulder sling.  [place] for Troubleshooting: [phone]  Video that illustrates how to properly use a shoulder sling: "Instructions for Proper Use of an Orthopaedic Sling" [email or web address]  Information for Discharge Teaching: EXPAREL (bupivacaine liposome injectable suspension)   Your surgeon or anesthesiologist gave you EXPAREL(bupivacaine) to help control your pain after surgery.   EXPAREL is a local anesthetic that provides pain relief by numbing the tissue around the surgical site.  EXPAREL is designed to release pain medication over time and can control pain for up to 72 hours.  Depending on how you respond to EXPAREL, you may require less pain medication during your recovery.  Possible side effects:  Temporary loss of sensation or ability to move in the area where bupivacaine was injected.  Nausea, vomiting, constipation  Rarely, numbness and tingling in your mouth or lips, lightheadedness, or anxiety may occur.  Call your doctor right away if you think you may be experiencing any of these sensations, or if you have other questions regarding possible side effects.  Follow all other discharge instructions given to you by your surgeon or  nurse. Eat a healthy diet and drink plenty of water or other fluids.  If you return to the hospital for any reason within 96 hours following the administration of EXPAREL, it is important for health care providers to know that you have received this anesthetic. A teal colored band has been placed on your arm with the date, time and amount of EXPAREL you have received in order to alert and inform your health care providers. Please leave this armband in place for the full 96 hours following administration, and then you may remove the band.   General Anesthesia, Adult, Care After This sheet gives you information about how to care for yourself after your procedure. Your health care provider may also give you more specific instructions. If you have problems or questions, contact your health care provider. What can I expect after the procedure? After the procedure, the following side effects are common:  Pain or discomfort at the IV site.  Nausea.  Vomiting.  Sore throat.  Trouble concentrating.  Feeling cold or chills.  Weak or tired.  Sleepiness and fatigue.  Soreness and body aches. These side effects can affect parts of the body that were not involved in surgery. Follow these instructions at home:  For at least 24 hours after the procedure:  Have a responsible adult stay with you.  It is important to have someone help care for you until you are awake and alert.  Rest as needed.  Do not: ? Participate in activities in which you could fall or become injured. ? Drive. ? Use heavy machinery. ? Drink alcohol. ? Take sleeping pills or medicines that cause drowsiness. ? Make important decisions or sign legal documents. ? Take care of children on your own. Eating and drinking  Follow any instructions from your health care provider about eating or drinking restrictions.  When you feel hungry, start by eating small amounts of foods that are soft and easy to digest (bland), such as  toast. Gradually return to your regular diet.  Drink enough fluid to keep your urine pale yellow.  If you vomit, rehydrate by drinking water, juice, or clear broth. General instructions  If you have sleep apnea, surgery and certain medicines can increase your risk for breathing problems. Follow instructions from your health care provider about wearing your sleep device: ? Anytime you are sleeping, including during daytime naps. ? While taking prescription pain medicines, sleeping medicines, or medicines that make you drowsy.  Return to your normal activities as told by your health care provider. Ask your health care provider what activities are safe for you.  Take over-the-counter and prescription medicines only as told by your health care provider.  If you smoke, do not smoke without supervision.  Keep all follow-up visits as told by your health care provider. This is important. Contact a health care provider if:  You have nausea or vomiting that does not get better with medicine.  You cannot eat or drink without vomiting.  You have pain that does not get better with medicine.  You are unable to pass urine.  You develop a skin rash.  You have a fever.  You have redness around your IV site that gets worse. Get help right away if:  You have difficulty breathing.  You have chest pain.  You have blood in your urine or stool, or you vomit blood. Summary  After the procedure, it is common to have a sore throat or nausea. It is also common to feel tired.  Have a responsible adult stay with you for the first 24 hours after general anesthesia. It is important to have someone help care for you until you are awake and alert.  When you feel hungry, start by eating small amounts of foods that are soft and easy to digest (bland), such as toast. Gradually return to your regular diet.  Drink enough fluid to keep your urine pale yellow.  Return to your normal activities as told by  your health care provider. Ask your health care provider what activities are safe for you. This information is not intended to replace advice given to you by your health care provider. Make sure you discuss any questions you have with your health care provider. Document Revised: 07/09/2017 Document Reviewed: 02/19/2017 Elsevier Patient Education  [place].  Scopolamine skin patches Remove in 72 hours. Wash hands immediately after removal. What is this medicine? SCOPOLAMINE (skoe POL a meen) is used to prevent nausea and vomiting caused by motion sickness, anesthesia and surgery. This medicine may be used for other purposes; ask your health care provider or pharmacist if you have questions. COMMON BRAND NAME(S): Transderm Scop What should I tell my health care provider before I take this medicine? They need to know if you have any of these conditions:  are scheduled to have a gastric  secretion test  glaucoma  heart disease  kidney disease  liver disease  lung or breathing disease, like asthma  mental illness  prostate disease  seizures  stomach or intestine problems  trouble passing urine  an unusual or allergic reaction to scopolamine, atropine, other medicines, foods, dyes, or preservatives  pregnant or trying to get pregnant  breast-feeding How should I use this medicine? This medicine is for external use only. Follow the directions on the prescription label. Wear only 1 patch at a time. Choose an area behind the ear, that is clean, dry, hairless and free from any cuts or irritation. Wipe the area with a clean dry tissue. Peel off the plastic backing of the skin patch, trying not to touch the adhesive side with your hands. Do not cut the patches. Firmly apply to the area you have chosen, with the metallic side of the patch to the skin and the tan-colored side showing. Once firmly in place, wash your hands well with soap and water. Do not get this medicine into  your eyes. After removing the patch, wash your hands and the area behind your ear thoroughly with soap and water. The patch will still contain some medicine after use. To avoid accidental contact or ingestion by children or pets, fold the used patch in half with the sticky side together and throw away in the trash out of the reach of children and pets. If you need to use a second patch after you remove the first, place it behind the other ear. A special MedGuide will be given to you by the pharmacist with each prescription and refill. Be sure to read this information carefully each time. Talk to your pediatrician regarding the use of this medicine in children. Special care may be needed. Overdosage: If you think you have taken too much of this medicine contact a poison control center or emergency room at once. NOTE: This medicine is only for you. Do not share this medicine with others. What if I miss a dose? This does not apply. This medicine is not for regular use. What may interact with this medicine?  alcohol  antihistamines for allergy cough and cold  atropine  certain medicines for anxiety or sleep  certain medicines for bladder problems like oxybutynin, tolterodine  certain medicines for depression like amitriptyline, fluoxetine, sertraline  certain medicines for stomach problems like dicyclomine, hyoscyamine  certain medicines for Parkinson's disease like benztropine, trihexyphenidyl  certain medicines for seizures like phenobarbital, primidone  general anesthetics like halothane, isoflurane, methoxyflurane, propofol  ipratropium  local anesthetics like lidocaine, pramoxine, tetracaine  medicines that relax muscles for surgery  phenothiazines like chlorpromazine, mesoridazine, prochlorperazine, thioridazine  narcotic medicines for pain  other belladonna alkaloids This list may not describe all possible interactions. Give your health care provider a list of all the  medicines, herbs, non-prescription drugs, or dietary supplements you use. Also tell them if you smoke, drink alcohol, or use illegal drugs. Some items may interact with your medicine. What should I watch for while using this medicine? Limit contact with water while swimming and bathing because the patch may fall off. If the patch falls off, throw it away and put a new one behind the other ear. You may get drowsy or dizzy. Do not drive, use machinery, or do anything that needs mental alertness until you know how this medicine affects you. Do not stand or sit up quickly, especially if you are an older patient. This reduces the risk of dizzy or  fainting spells. Alcohol may interfere with the effect of this medicine. Avoid alcoholic drinks. Your mouth may get dry. Chewing sugarless gum or sucking hard candy, and drinking plenty of water may help. Contact your healthcare professional if the problem does not go away or is severe. This medicine may cause dry eyes and blurred vision. If you wear contact lenses, you may feel some discomfort. Lubricating drops may help. See your healthcare professional if the problem does not go away or is severe. If you are going to need surgery, an MRI, CT scan, or other procedure, tell your healthcare professional that you are using this medicine. You may need to remove the patch before the procedure. What side effects may I notice from receiving this medicine? Side effects that you should report to your doctor or health care professional as soon as possible:  allergic reactions like skin rash, itching or hives; swelling of the face, lips, or tongue  blurred vision  changes in vision  confusion  dizziness  eye pain  fast, irregular heartbeat  hallucinations, loss of contact with reality  nausea, vomiting  pain or trouble passing urine  restlessness  seizures  skin irritation  stomach pain Side effects that usually do not require medical attention  (report to your doctor or health care professional if they continue or are bothersome):  drowsiness  dry mouth  headache  sore throat This list may not describe all possible side effects. Call your doctor for medical advice about side effects. You may report side effects to FDA at 1-800-FDA-1088. Where should I keep my medicine? Keep out of the reach of children. Store at room temperature between 20 and 25 degrees C (68 and 77 degrees F). Keep this medicine in the foil package until ready to use. Throw away any unused medicine after the expiration date. NOTE: This sheet is a summary. It may not cover all possible information. If you have questions about this medicine, talk to your doctor, pharmacist, or health care provider.  2020 Elsevier/Gold Standard (2017-09-24 16:14:46)

## 2019-09-01 NOTE — Anesthesia Postprocedure Evaluation (Signed)
Anesthesia Post Note  Patient: Shawn Gordon  Procedure(s) Performed: SHOULDER ARTHROSCOPY WITH SUBACROMIAL DECOMPRESSION, ROTATOR CUFF REPAIR AND BICEP TENDON REPAIR, SUBSACAPULAIRS REPAIR, DISTAL CLAVICLE EXCISION (Left Shoulder)     Patient location during evaluation: PACU Anesthesia Type: General Level of consciousness: awake and alert Pain management: pain level controlled Vital Signs Assessment: post-procedure vital signs reviewed and stable Respiratory status: spontaneous breathing, nonlabored ventilation, respiratory function stable and patient connected to nasal cannula oxygen Cardiovascular status: blood pressure returned to baseline and stable Postop Assessment: no apparent nausea or vomiting Anesthetic complications: no    Shawn Gordon Shawn Gordon  Patient with difficult to treat hypotension during his procedure. No history of HTN. Takes very small dose of lisinopril for renal protection 2/2 DM diagnosis. Didn't take lisinopril today. Discussed our challenges with surgeon and suggested that patient have surgery in a hospital setting next time so that more medications and resources are available for better blood pressure control (albumin, infusion pumps, vasopressin, etc).

## 2019-09-01 NOTE — Op Note (Signed)
SURGERY DATE: 09/01/2019  PRE-OP DIAGNOSIS:  1. Left subacromial impingement 2. Left biceps tendinopathy 3. Left rotator cuff tear (supraspinatus) 4. Left acromioclavicular joint osteoarthritis  POST-OP DIAGNOSIS: 1. Left subacromial impingement 2. Left biceps tendinopathy 3. Left rotator cuff tear (supraspinatus) 4. Left acromioclavicular joint osteoarthritis  PROCEDURES:  1. Left mini-open rotator cuff repair (supraspinatus) 2. Left open biceps tenodesis 3. Left arthroscopic subacromial decompression 4. Left arthroscopic extensive debridement of shoulder (glenohumeral and subacromial spaces) 5. Left arthroscopic distal clavicle excision  SURGEON: Cato Mulligan, MD  ASSISTANT:  Anitra Lauth, PA  ANESTHESIA: Gen with Exparil interscalene block  ESTIMATED BLOOD LOSS: 25cc  DRAINS:  none  TOTAL IV FLUIDS: per anesthesia   SPECIMENS: none  IMPLANTS:  - Arthrex 1.52mm FiberTak - Iconix SPEED double loaded with 1.2 and 2.76mm tape x 2 - Stryker 4.68mm Omega Knotless Anchor System  x 2   OPERATIVE FINDINGS:  Examination under anesthesia: A careful examination under anesthesia was performed.  Passive range of motion was: FF: 150; ER at side: 40; ER in abduction: 85; IR in abduction: 45.  Anterior load shift: NT.  Posterior load shift: NT.  Sulcus in neutral: NT.  Sulcus in ER: NT.    Intra-operative findings: A thorough arthroscopic examination of the shoulder was performed.  The findings are: 1. Biceps tendon: tendinopathy with significant erythema and thickening of proximal biceps 2. Superior labrum: injected with surrounding synovitis, type II SLAP tear 3. Posterior labrum and capsule: normal 4. Inferior capsule and inferior recess: normal 5. Glenoid cartilage surface: Normal 6. Supraspinatus attachment: full-thickness tear 7. Posterior rotator cuff attachment: normal 8. Humeral head articular cartilage: normal 9. Rotator interval: significant synovitis 10:  Subscapularis tendon: attachment intact 11. Anterior labrum: degenerative 12. IGHL: normal  OPERATIVE REPORT:   Indications for procedure: Shawn Gordon is a 59 y.o. male with ~7 months of L shoulder pain that has failed non-operative management including activity modification, physical therapy, medical management and corticosteroid injection without adequate relief of symptoms. Clinical exam and MRI were suggestive of rotator cuff tear, biceps tendinopathy, subacromial impingement, and acromioclavicular joint arthritis. After discussion of risks, benefits, and alternatives to surgery, the patient elected to proceed.   Procedure in detail:  I identified Shawn Gordon in the pre-operative holding area.  I marked the operative shoulder with my initials. I reviewed the risks and benefits of the proposed surgical intervention, and the patient (and/or patient's guardian) wished to proceed.  Anesthesia was then performed with an Exparil interscalene block.  The patient was transferred to the operative suite and placed in the beach chair position.    SCDs were placed on the lower extremities. Appropriate IV antibiotics were administered prior to incision. The operative upper extremity was then prepped and draped in standard fashion. A time out was performed confirming the correct extremity, correct patient, and correct procedure.   I then created a standard posterior portal with an 11 blade. The glenohumeral joint was easily entered with a blunt trochar and the arthroscope introduced. The findings of diagnostic arthroscopy are described above. I debrided degenerative tissue including the synovitic tissue about the rotator interval and anterior and superior labrum. I then coagulated the extensively inflamed synovium to obtain hemostasis and reduce the risk of post-operative swelling using an Arthrocare radiofrequency device. I performed a biceps tenotomy using an arthroscopic scissors and used a  motorized shaver to debride the stump back to a stable base.   Next, the arthroscope was then introduced into the  subacromial space. A direct lateral portal was created with an 11-blade after spinal needle localization. An extensive subacromial bursectomy was performed using a combination of the shaver and Arthrocare wand. The entire acromial undersurface was exposed and the CA ligament was subperiosteally elevated to expose the anterior acromial hook. A 5.61mm barrel burr was used to create a flat anterior and lateral aspect of the acromion, converting it from a Type 2 to a Type 1 acromion. Care was made to keep the deltoid fascia intact.  I then turned my attention to the arthroscopic distal clavicle excision. I identified the acromioclavicular joint. Surrounding bursal tissue was debrided and the edges of the joint were identified. I used the 5.71mm barrel burr to remove the distal clavicle parallel to the edge of the acromion. I was able to fit two widths of the burr into the space between the distal clavicle and acromion, signifying that I had removed ~79mm of distal clavicle. This was confirmed by viewing anteriorly and introducing a probe with measuring marks from the lateral portal. Hemostasis was achieved with an Arthrocare wand. Fluid was evacuated from the shoulder.   A longitudinal incision from the anterolateral acromion ~7cm in length was made overlying the raphe between the anterior and middle heads of the deltoid. The raphe was identified and it was incised. The subacromial space was identified. Any remaining bursa was excised. The rotator cuff tear was identified. It was an U-shaped tear of the anterior supraspinatus.  We then turned our attention to the biceps tenodesis. The arm was externally rotated.  The bicipital groove was identified.  A 15 blade was used to make a cut overlying the biceps tendon, and the tendon was removed using a right angle clamp.  The base of the bicipital groove was  identified and cleared of soft tissue.  A FiberTak anchor was placed in the bicipital groove.  The biceps tendon was held at the appropriate amount of tension.  One set of sutures was passed through the biceps anchor with one limb passed in a simple fashion and the second limb passed in a simple plus locking stitch pattern.  This was repeated for the other set of sutures.  This construct allowed for shuttling the biceps tendon down to the bone.  The sutures were tied and cut.  The diseased portion of the proximal biceps was then excised.  The arm was then internally rotated.  The rotator cuff footprint was cleared of soft tissue. A rongeur was used to gently decorticate the rotator cuff footprint to allow for improved healing. Two Iconix SPEED anchors were placed just lateral to the articular margin.  The rotator cuff was mobilized using key elevators both superior and inferior to the tear. The rotator cuff was able to be reduced to its footprint and then held in a reduced position with graspers. All 8 strands of suture from the medial row anchors were passed through the rotator cuff in an appropriate fashion.  Two Omega anchors were placed for the lateral row anchors with one limb of each of the medial row sutures passed through each anchor.  Additionally the 1.2 mm tape from the anterior anchor was passed through a dog ear in a mattress fashion and tied, further reducing the dog ear.  This allowed for excellent reapproximation of the rotator cuff over its footprint. This construct was stable with external and internal rotation.  The wound was thoroughly irrigated.  The deltoid split was closed with 0 Vicryl.  The subdermal layer was  closed with 2-0 Vicryl.  The skin was closed with 4-0 Monocryl and Dermabond. The portals were closed with 3-0 Nylon. Xeroform was applied to the incisions. A sterile dressing was applied, followed by a Polar Care sleeve and a SlingShot shoulder immobilizer/sling. The patient was  awakened from anesthesia without difficulty and was transferred to the PACU in stable condition.   Of note, assistance from a PA was essential to performing the surgery.  PA was present for the entire surgery.  PA assisted with patient positioning, retraction, instrumentation, and wound closure. The surgery would have been more difficult and had longer operative time without PA assistance.    COMPLICATIONS: none  DISPOSITION: plan for discharge home after recovery in PACU   POSTOPERATIVE PLAN: Remain in sling (except hygiene and elbow/wrist/hand RoM exercises as instructed by PT) x 6 weeks and NWB for this time. PT to begin 3-4 days after surgery. Rotator cuff repair and biceps tenodesis rehab protocol. ASA 325mg  daily x 2 weeks for DVT ppx.

## 2019-09-04 ENCOUNTER — Encounter: Payer: Self-pay | Admitting: *Deleted

## 2019-09-06 ENCOUNTER — Ambulatory Visit: Payer: Managed Care, Other (non HMO)

## 2019-10-29 ENCOUNTER — Other Ambulatory Visit: Payer: Self-pay | Admitting: Family Medicine

## 2019-10-29 DIAGNOSIS — E1165 Type 2 diabetes mellitus with hyperglycemia: Secondary | ICD-10-CM

## 2019-11-20 ENCOUNTER — Other Ambulatory Visit: Payer: Self-pay | Admitting: Family Medicine

## 2019-11-20 DIAGNOSIS — K219 Gastro-esophageal reflux disease without esophagitis: Secondary | ICD-10-CM

## 2019-11-20 NOTE — Telephone Encounter (Signed)
Requested Prescriptions  Pending Prescriptions Disp Refills  . omeprazole (PRILOSEC) 40 MG capsule [Pharmacy Med Name: OMEPRAZOLE DR 40 MG CAPSULE] 180 capsule 1    Sig: TAKE 1 CAPSULE (40 MG TOTAL) BY MOUTH 2 (TWO) TIMES DAILY BEFORE A MEAL.     Gastroenterology: Proton Pump Inhibitors Passed - 11/20/2019  3:54 PM      Passed - Valid encounter within last 12 months    Recent Outpatient Visits          6 months ago Type 2 diabetes mellitus with hyperglycemia, without long-term current use of insulin [facility])   [place], DO   6 months ago Flu-like symptoms   [facility] Olin Hauser, DO   7 months ago Left lateral epicondylitis   [facility], Devonne Doughty, DO   1 year ago Type 2 diabetes mellitus with hyperglycemia, without long-term current use of insulin [facility])   [facility] Olin Hauser, DO   1 year ago Type 2 diabetes mellitus with hyperglycemia, without long-term current use of insulin [facility])   [facility], Devonne Doughty, DO

## 2019-11-26 ENCOUNTER — Other Ambulatory Visit: Payer: Self-pay | Admitting: Family Medicine

## 2019-11-26 DIAGNOSIS — F3341 Major depressive disorder, recurrent, in partial remission: Secondary | ICD-10-CM

## 2019-11-26 DIAGNOSIS — E119 Type 2 diabetes mellitus without complications: Secondary | ICD-10-CM

## 2019-11-26 NOTE — Telephone Encounter (Signed)
Requested medications are due for refill today?  Yes  Requested medications are on active medication list?  Yes  Last Refill:   Fluoxetine HCL -  06/04/2019  # 90 with 1 refill  Metformin - 06/04/2019  - # 180 with 1 refill   Future visit scheduled?  No  Notes to Clinic:  Medications failed RX refill protocol due to no valid encounter in the last 6 months.  In addition, for metformin:  Labs have not been performed within the established time frames.  Please review.

## 2019-11-27 ENCOUNTER — Other Ambulatory Visit: Payer: Self-pay | Admitting: Family Medicine

## 2019-11-27 DIAGNOSIS — E785 Hyperlipidemia, unspecified: Secondary | ICD-10-CM

## 2019-11-27 NOTE — Telephone Encounter (Signed)
Requested Prescriptions  Pending Prescriptions Disp Refills  . atorvastatin (LIPITOR) 10 MG tablet [Pharmacy Med Name: ATORVASTATIN 10 MG TABLET] 90 tablet 1    Sig: TAKE 1 TABLET BY MOUTH EVERYDAY AT BEDTIME     Cardiovascular:  Antilipid - Statins Failed - 11/27/2019  1:22 AM      Failed - Total Cholesterol in normal range and within 360 days    Cholesterol  Date Value Ref Range Status  08/16/2018 193 <200 mg/dL Final         Failed - LDL in normal range and within 360 days    LDL Cholesterol (Calc)  Date Value Ref Range Status  08/16/2018 117 (H) mg/dL (calc) Final    Comment:    Reference range: <100 . Desirable range <100 mg/dL for primary prevention;   <70 mg/dL for patients with CHD or diabetic patients  with > or = 2 CHD risk factors. Marland Kitchen LDL-C is now calculated using the Martin-Hopkins  calculation, which is a validated novel method providing  better accuracy than the Friedewald equation in the  estimation of LDL-C.  Cresenciano Genre et al. Annamaria Helling. MU:[id number]): 2061-2068  (http://education.[employer].com/faq/FAQ164)          Failed - HDL in normal range and within 360 days    HDL  Date Value Ref Range Status  08/16/2018 42 >40 mg/dL Final         Failed - Triglycerides in normal range and within 360 days    Triglycerides  Date Value Ref Range Status  08/16/2018 222 (H) <150 mg/dL Final    Comment:    . If a non-fasting specimen was collected, consider repeat triglyceride testing on a fasting specimen if clinically indicated.  Yates Decamp et al. J. of Clin. Lipidol. [id number]. Marland Kitchen          Passed - Patient is not pregnant      Passed - Valid encounter within last 12 months    Recent Outpatient Visits          6 months ago Type 2 diabetes mellitus with hyperglycemia, without long-term current use of insulin [facility])   [place], DO   6 months ago Flu-like symptoms   [facility] Olin Hauser, DO   7 months ago Left lateral epicondylitis   [facility] Ce[facility], Devonne Doughty, DO   1 year ago Type 2 diabetes mellitus with hyperglycemia, without long-term current use of insulin [facility])   [facility] Olin Hauser, DO   1 year ago Type 2 diabetes mellitus with hyperglycemia, without long-term current use of insulin [facility])   [facility], Devonne Doughty, DO

## 2019-12-20 ENCOUNTER — Telehealth: Payer: Self-pay | Admitting: Family Medicine

## 2019-12-20 DIAGNOSIS — E119 Type 2 diabetes mellitus without complications: Secondary | ICD-10-CM

## 2019-12-20 NOTE — Telephone Encounter (Signed)
Requested medications are due for refill today? Yes  Requested medications are on active medication list?  Yes  Last Refill:  06/27/2019  # 90 with one refill  Future visit scheduled?  No   Notes to Clinic:  Medication failed RX refill protocol due to no valid encounter in the last 6 months and no labs within 180 days.  Last labs were performed on 08/16/2018.

## 2020-01-24 ENCOUNTER — Other Ambulatory Visit: Payer: Self-pay | Admitting: Family Medicine

## 2020-01-24 DIAGNOSIS — N401 Enlarged prostate with lower urinary tract symptoms: Secondary | ICD-10-CM

## 2020-01-24 NOTE — Telephone Encounter (Signed)
Requested Prescriptions  Pending Prescriptions Disp Refills   tamsulosin (FLOMAX) 0.4 MG CAPS capsule [Pharmacy Med Name: TAMSULOSIN HCL 0.4 MG CAPSULE] 90 capsule 1    Sig: TAKE 1 CAPSULE (0.4 MG TOTAL) BY MOUTH DAILY AFTER BREAKFAST.     Urology: Alpha-Adrenergic Blocker Passed - 01/24/2020  1:24 AM      Passed - Last BP in normal range    BP Readings from Last 1 Encounters:  09/01/19 115/64         Passed - Valid encounter within last 12 months    Recent Outpatient Visits          8 months ago Type 2 diabetes mellitus with hyperglycemia, without long-term current use of insulin [facility])   [place], Devonne Doughty, DO   8 months ago Flu-like symptoms   [facility] Olin Hauser, DO   9 months ago Left lateral epicondylitis   [place], DO   1 year ago Type 2 diabetes mellitus with hyperglycemia, without long-term current use of insulin [facility])   [facility] Olin Hauser, DO   1 year ago Type 2 diabetes mellitus with hyperglycemia, without long-term current use of insulin [facility])   [facility], Devonne Doughty, DO

## 2020-02-20 ENCOUNTER — Other Ambulatory Visit: Payer: Self-pay | Admitting: Family Medicine

## 2020-02-20 DIAGNOSIS — F3341 Major depressive disorder, recurrent, in partial remission: Secondary | ICD-10-CM

## 2020-02-20 DIAGNOSIS — E119 Type 2 diabetes mellitus without complications: Secondary | ICD-10-CM

## 2020-02-20 NOTE — Telephone Encounter (Signed)
Call to patient- scheduled for 8/16 medication follow-up. Courtesy RF given.

## 2020-03-04 ENCOUNTER — Other Ambulatory Visit: Payer: Self-pay | Admitting: Family Medicine

## 2020-03-04 ENCOUNTER — Ambulatory Visit (INDEPENDENT_AMBULATORY_CARE_PROVIDER_SITE_OTHER): Payer: Managed Care, Other (non HMO) | Admitting: Family Medicine

## 2020-03-04 ENCOUNTER — Other Ambulatory Visit: Payer: Self-pay

## 2020-03-04 ENCOUNTER — Encounter: Payer: Self-pay | Admitting: Family Medicine

## 2020-03-04 VITALS — BP 132/82 | HR 112 | Temp 97.4°F | Resp 16 | Ht 70.0 in | Wt 262.0 lb

## 2020-03-04 DIAGNOSIS — E1165 Type 2 diabetes mellitus with hyperglycemia: Secondary | ICD-10-CM

## 2020-03-04 DIAGNOSIS — F331 Major depressive disorder, recurrent, moderate: Secondary | ICD-10-CM | POA: Diagnosis not present

## 2020-03-04 DIAGNOSIS — E1169 Type 2 diabetes mellitus with other specified complication: Secondary | ICD-10-CM

## 2020-03-04 DIAGNOSIS — Z0000 Encounter for general adult medical examination without abnormal findings: Secondary | ICD-10-CM

## 2020-03-04 DIAGNOSIS — N401 Enlarged prostate with lower urinary tract symptoms: Secondary | ICD-10-CM

## 2020-03-04 DIAGNOSIS — E785 Hyperlipidemia, unspecified: Secondary | ICD-10-CM

## 2020-03-04 DIAGNOSIS — N138 Other obstructive and reflux uropathy: Secondary | ICD-10-CM

## 2020-03-04 LAB — POCT GLYCOSYLATED HEMOGLOBIN (HGB A1C): Hemoglobin A1C: 7.5 % — AB (ref 4.0–5.6)

## 2020-03-04 MED ORDER — FLUOXETINE HCL 40 MG PO CAPS: 40.0000 mg | ORAL_CAPSULE | Freq: Every day | ORAL | 3 refills | Status: DC

## 2020-03-04 MED ORDER — METFORMIN HCL 1000 MG PO TABS: 1000.0000 mg | ORAL_TABLET | Freq: Two times a day (BID) | ORAL | 3 refills | Status: DC

## 2020-03-04 NOTE — Assessment & Plan Note (Signed)
Chronic major depression Moderately active See PHQ Refill Fluoxetin 40mg 

## 2020-03-04 NOTE — Patient Instructions (Addendum)
Thank you for coming to the office today.  Recent Labs    05/15/19 1612 03/04/20 0912  HGBA1C 8.3* 7.5*   Try driving glove for Left hand for calluses  Consider Flu Shot next time.  Refilled Metformin and Fluoxetine for 90 day now with extra refills, please ask if they can complete your 90 day fill from last time   DUE for FASTING BLOOD WORK (no food or drink after midnight before the lab appointment, only water or coffee without cream/sugar on the morning of)  SCHEDULE "Lab Only" visit in the morning at the clinic for lab draw in 3 MONTHS   - Make sure Lab Only appointment is at about 1 week before your next appointment, so that results will be available  For Lab Results, once available within 2-3 days of blood draw, you can can log in to MyChart online to view your results and a brief explanation. Also, we can discuss results at next follow-up visit.    Please schedule a Follow-up Appointment to: Return in about 3 months (around 06/04/2020) for Annual Physical.  If you have any other questions or concerns, please feel free to call the office or send a message through [place]. You may also schedule an earlier appointment if necessary.  Additionally, you may be receiving a survey about your experience at our office within a few days to 1 week by e-mail or mail. We value your feedback.  [name], DO [place]

## 2020-03-04 NOTE — Assessment & Plan Note (Signed)
Improved A1c 7.5 from >8 On GLP1 improved adherence Complications - other including hyperlipidemia, GERD, obesity - increases risk of future cardiovascular complications   Plan:  1. Continue Bydureon BCise 2mg  weekly inj - Continue Metformin 1000mg  BID for now - re order 2. Encourage improved lifestyle - low carb, low sugar diet, reduce portion size, continue improving regular exercise 3. Check CBG , bring log to next visit for review 4. DM Foot exam 5. UTD DM eye 08/2019

## 2020-03-04 NOTE — Progress Notes (Signed)
Subjective:    Patient ID: Shawn Gordon, male    DOB: 04/05/1961, 59 y.o.   MRN: [id number]  Shawn Gordon is a 59 y.o. male presenting on 03/04/2020 for Diabetes   HPI    CHRONIC DM, Type 2with hyperglycemia, uncontrolled / Morbid Obesity  Has been back on GLP1 Bydureon Last A1c improved to 8 range Due today for repeat A1c Fasting today, admits took medicines, and felt nausea CBGs:Limited CBG results Meds:Metformin 1000mg  BID, Bydureon BCise 2mg  weekly inj Reports good compliance. Tolerating well w/o side-effects Currently on ACEi Lifestyle: Reduced carbs. Walking more Denies hypoglycemia, polyuria, visual changes, numbness or tingling.  Major Depression, recurrent chronic, moderate Chronic problem See PHQ On Fluoxetine 40mg  daily, needs re order today   Health Maintenance: UTD COVID19 vaccine. Due for Flu Vaccine, to be determined.  Depression screen [facility] 2/9 03/04/2020 05/08/2019 11/18/2018  Decreased Interest 3 0 1  Down, Depressed, Hopeless 2 0 0  PHQ - 2 Score 5 0 1  Altered sleeping 0 0 0  Tired, decreased energy 3 0 1  Change in appetite 3 0 1  Feeling bad or failure about yourself  0 0 0  Trouble concentrating 1 0 0  Moving slowly or fidgety/restless 0 0 0  Suicidal thoughts 0 0 0  PHQ-9 Score 12 0 3  Difficult doing work/chores Very difficult Not difficult at all Not difficult at all   GAD 7 : Generalized Anxiety Score 03/04/2020  Nervous, Anxious, on Edge 2  Control/stop worrying 1  Worry too much - different things 2  Trouble relaxing 1  Restless 1  Easily annoyed or irritable 1  Afraid - awful might happen 0  Total GAD 7 Score 8  Anxiety Difficulty Somewhat difficult      Social History   Tobacco Use  . Smoking status: Former Smoker    Packs/day: 2.00    Types: Cigarettes    Start date: 04/19/1978    Quit date: 02/26/2001    Years since quitting: 19.0  . Smokeless tobacco: Former Network engineer  . Vaping Use: Never used    Substance Use Topics  . Alcohol use: No    Alcohol/week: 0.0 standard drinks  . Drug use: No    Review of Systems Per HPI unless specifically indicated above     Objective:    BP 132/82   Pulse (!) 112   Temp (!) 97.4 F (36.3 C) (Temporal)   Resp 16   Ht 5\' 10"  (1.778 m)   Wt 262 lb (118.8 kg)   SpO2 96%   BMI 37.59 kg/m   Wt Readings from Last 3 Encounters:  03/04/20 262 lb (118.8 kg)  09/01/19 253 lb 9.6 oz (115 kg)  05/15/19 264 lb 6.4 oz (119.9 kg)    Physical Exam Vitals and nursing note reviewed.  Constitutional:      General: He is not in acute distress.    Appearance: He is well-developed. He is not diaphoretic.     Comments: Well-appearing, comfortable, cooperative  HENT:     Head: Normocephalic and atraumatic.  Eyes:     General:        Right eye: No discharge.        Left eye: No discharge.     Conjunctiva/sclera: Conjunctivae normal.  Neck:     Thyroid: No thyromegaly.  Cardiovascular:     Rate and Rhythm: Regular rhythm. Tachycardia present.     Heart sounds: Normal heart sounds. No murmur  heard.   Pulmonary:     Effort: Pulmonary effort is normal. No respiratory distress.     Breath sounds: Normal breath sounds. No wheezing or rales.  Musculoskeletal:        General: Normal range of motion.     Cervical back: Normal range of motion and neck supple.  Lymphadenopathy:     Cervical: No cervical adenopathy.  Skin:    General: Skin is warm and dry.     Findings: No erythema or rash.  Neurological:     Mental Status: He is alert and oriented to person, place, and time.  Psychiatric:        Behavior: Behavior normal.     Comments: Well groomed, good eye contact, normal speech and thoughts      Diabetic Foot Exam - Simple   Simple Foot Form Diabetic Foot exam was performed with the following findings: Yes 03/04/2020  9:18 AM  Visual Inspection No deformities, no ulcerations, no other skin breakdown bilaterally: Yes Sensation  Testing Intact to touch and monofilament testing bilaterally: Yes Pulse Check Posterior Tibialis and Dorsalis pulse intact bilaterally: Yes Comments    Recent Labs    05/15/19 1612 03/04/20 0912  HGBA1C 8.3* 7.5*     Results for orders placed or performed in visit on 03/04/20  POCT HgB A1C  Result Value Ref Range   Hemoglobin A1C 7.5 (A) 4.0 - 5.6 %      Assessment & Plan:   Problem List Items Addressed This Visit    Type 2 diabetes mellitus with hyperglycemia (HCC) - Primary    Improved A1c 7.5 from >8 On GLP1 improved adherence Complications - other including hyperlipidemia, GERD, obesity - increases risk of future cardiovascular complications   Plan:  1. Continue Bydureon BCise 2mg  weekly inj - Continue Metformin 1000mg  BID for now - re order 2. Encourage improved lifestyle - low carb, low sugar diet, reduce portion size, continue improving regular exercise 3. Check CBG , bring log to next visit for review 4. DM Foot exam 5. UTD DM eye 08/2019      Relevant Medications   metFORMIN (GLUCOPHAGE) 1000 MG tablet   Other Relevant Orders   POCT HgB A1C (Completed)   Morbid obesity (HCC)    BMI >37 with risk factors comorbid type 2 diabetes, and hyperlipidemia, meet criteria for morbid obesity dx  Encourage wt loss and lifestyle, on GLP1      Relevant Medications   metFORMIN (GLUCOPHAGE) 1000 MG tablet   Major depressive disorder, recurrent, moderate (HCC)    Chronic major depression Moderately active See PHQ Refill Fluoxetin 40mg       Relevant Medications   FLUoxetine (PROZAC) 40 MG capsule         Meds ordered this encounter  Medications  . metFORMIN (GLUCOPHAGE) 1000 MG tablet    Sig: Take 1 tablet (1,000 mg total) by mouth 2 (two) times daily with a meal.    Dispense:  180 tablet    Refill:  3    Please fill rest of his 90 day fill (he had 30 day filled recently) he would like to get back on track with his fill schedule.  Marland Kitchen FLUoxetine (PROZAC) 40  MG capsule    Sig: Take 1 capsule (40 mg total) by mouth daily.    Dispense:  90 capsule    Refill:  3    Please fill rest of his 90 day fill (he had 30 day filled recently) he would like to get  back on track with his fill schedule.     Follow up plan: Return in about 3 months (around 06/04/2020) for Annual Physical.  Future labs ordered for 05/28/20  [name], [facility] 03/04/2020, 9:02 AM

## 2020-03-04 NOTE — Assessment & Plan Note (Signed)
BMI >37 with risk factors comorbid type 2 diabetes, and hyperlipidemia, meet criteria for morbid obesity dx  Encourage wt loss and lifestyle, on GLP1 

## 2020-04-12 ENCOUNTER — Other Ambulatory Visit: Payer: Self-pay | Admitting: Orthopedic Surgery

## 2020-05-13 ENCOUNTER — Other Ambulatory Visit: Payer: Self-pay

## 2020-05-13 ENCOUNTER — Encounter: Payer: Self-pay | Admitting: Orthopedic Surgery

## 2020-05-15 ENCOUNTER — Other Ambulatory Visit: Payer: Self-pay

## 2020-05-15 ENCOUNTER — Other Ambulatory Visit
Admission: RE | Admit: 2020-05-15 | Discharge: 2020-05-15 | Disposition: A | Discharge status: Home / Self Care | Payer: Managed Care, Other (non HMO) | Source: Ambulatory Visit | Attending: Orthopedic Surgery | Admitting: Orthopedic Surgery

## 2020-05-15 DIAGNOSIS — Z20822 Contact with and (suspected) exposure to covid-19: Secondary | ICD-10-CM | POA: Diagnosis not present

## 2020-05-15 DIAGNOSIS — Z01812 Encounter for preprocedural laboratory examination: Secondary | ICD-10-CM | POA: Diagnosis not present

## 2020-05-15 LAB — SARS CORONAVIRUS 2 (TAT 6-24 HRS): SARS Coronavirus 2: NEGATIVE

## 2020-05-17 ENCOUNTER — Other Ambulatory Visit: Payer: Self-pay

## 2020-05-17 ENCOUNTER — Encounter
Admission: RE | Disposition: A | Discharge status: Home / Self Care | Payer: Self-pay | Source: Home / Self Care | Attending: Orthopedic Surgery

## 2020-05-17 ENCOUNTER — Ambulatory Visit: Payer: Managed Care, Other (non HMO) | Admitting: Anesthesiology

## 2020-05-17 ENCOUNTER — Ambulatory Visit
Admission: RE | Admit: 2020-05-17 | Discharge: 2020-05-17 | Disposition: A | Discharge status: Home / Self Care | Payer: Managed Care, Other (non HMO) | Attending: Orthopedic Surgery | Admitting: Orthopedic Surgery

## 2020-05-17 ENCOUNTER — Encounter: Payer: Self-pay | Admitting: Orthopedic Surgery

## 2020-05-17 DIAGNOSIS — M19011 Primary osteoarthritis, right shoulder: Secondary | ICD-10-CM | POA: Insufficient documentation

## 2020-05-17 DIAGNOSIS — M7521 Bicipital tendinitis, right shoulder: Secondary | ICD-10-CM | POA: Insufficient documentation

## 2020-05-17 DIAGNOSIS — M75101 Unspecified rotator cuff tear or rupture of right shoulder, not specified as traumatic: Secondary | ICD-10-CM | POA: Diagnosis present

## 2020-05-17 HISTORY — PX: SHOULDER ARTHROSCOPY WITH SUBACROMIAL DECOMPRESSION AND OPEN ROTATOR C: SHX5688

## 2020-05-17 LAB — GLUCOSE, CAPILLARY
Glucose-Capillary: 138 mg/dL — ABNORMAL HIGH (ref 70–99)
Glucose-Capillary: 177 mg/dL — ABNORMAL HIGH (ref 70–99)

## 2020-05-17 SURGERY — SHOULDER ARTHROSCOPY WITH SUBACROMIAL DECOMPRESSION AND OPEN ROTATOR CUFF REPAIR, OPEN BICEPS TENDON REPAIR
Anesthesia: General | Laterality: Right

## 2020-05-17 MED ORDER — BUPIVACAINE HCL (PF) 0.5 % IJ SOLN
INTRAMUSCULAR | Status: DC | PRN
  Administered 2020-05-17: 20 mL via PERINEURAL

## 2020-05-17 MED ORDER — ONDANSETRON HCL 4 MG/2ML IJ SOLN: 4.0000 mg | Freq: Once | INTRAMUSCULAR | Status: DC | PRN

## 2020-05-17 MED ORDER — OXYCODONE HCL 5 MG/5ML PO SOLN: 5.0000 mg | Freq: Once | ORAL | Status: DC | PRN

## 2020-05-17 MED ORDER — DEXAMETHASONE SODIUM PHOSPHATE 4 MG/ML IJ SOLN
INTRAMUSCULAR | Status: DC | PRN
  Administered 2020-05-17: 4 mg via INTRAVENOUS

## 2020-05-17 MED ORDER — OXYCODONE HCL 5 MG PO TABS
5.0000 mg | ORAL_TABLET | ORAL | 0 refills | Status: DC | PRN
Start: 2020-05-17 — End: 2020-06-04

## 2020-05-17 MED ORDER — FENTANYL CITRATE (PF) 100 MCG/2ML IJ SOLN
INTRAMUSCULAR | Status: DC | PRN
  Administered 2020-05-17 (×2): 50 ug via INTRAVENOUS
  Administered 2020-05-17: 25 ug via INTRAVENOUS

## 2020-05-17 MED ORDER — ACETAMINOPHEN 500 MG PO TABS: 1000.0000 mg | ORAL_TABLET | Freq: Three times a day (TID) | ORAL | 2 refills | Status: AC

## 2020-05-17 MED ORDER — ONDANSETRON HCL 4 MG/2ML IJ SOLN
INTRAMUSCULAR | Status: DC | PRN
  Administered 2020-05-17: 4 mg via INTRAVENOUS

## 2020-05-17 MED ORDER — LACTATED RINGERS IV SOLN: INTRAVENOUS | Status: DC

## 2020-05-17 MED ORDER — ROPIVACAINE HCL 5 MG/ML IJ SOLN: INTRAMUSCULAR | Status: DC | PRN

## 2020-05-17 MED ORDER — LACTATED RINGERS IV SOLN
INTRAVENOUS | Status: DC | PRN
  Administered 2020-05-17: 12000 mL

## 2020-05-17 MED ORDER — MIDAZOLAM HCL 5 MG/5ML IJ SOLN
INTRAMUSCULAR | Status: DC | PRN
  Administered 2020-05-17: 2 mg via INTRAVENOUS

## 2020-05-17 MED ORDER — BUPIVACAINE LIPOSOME 1.3 % IJ SUSP
INTRAMUSCULAR | Status: DC | PRN
  Administered 2020-05-17: 20 mL via PERINEURAL

## 2020-05-17 MED ORDER — ACETAMINOPHEN 325 MG PO TABS: 325.0000 mg | ORAL_TABLET | ORAL | Status: DC | PRN

## 2020-05-17 MED ORDER — CEFAZOLIN SODIUM-DEXTROSE 2-4 GM/100ML-% IV SOLN
2.0000 g | INTRAVENOUS | Status: AC
  Administered 2020-05-17: 2 g via INTRAVENOUS

## 2020-05-17 MED ORDER — EPHEDRINE SULFATE 50 MG/ML IJ SOLN
INTRAMUSCULAR | Status: DC | PRN
  Administered 2020-05-17: 5 mg via INTRAVENOUS
  Administered 2020-05-17: 10 mg via INTRAVENOUS
  Administered 2020-05-17 (×4): 5 mg via INTRAVENOUS

## 2020-05-17 MED ORDER — ACETAMINOPHEN 160 MG/5ML PO SOLN: 325.0000 mg | ORAL | Status: DC | PRN

## 2020-05-17 MED ORDER — GLYCOPYRROLATE 0.2 MG/ML IJ SOLN
INTRAMUSCULAR | Status: DC | PRN
  Administered 2020-05-17: .1 mg via INTRAVENOUS

## 2020-05-17 MED ORDER — PROPOFOL 10 MG/ML IV BOLUS
INTRAVENOUS | Status: DC | PRN
  Administered 2020-05-17: 120 mg via INTRAVENOUS

## 2020-05-17 MED ORDER — ONDANSETRON 4 MG PO TBDP: 4.0000 mg | ORAL_TABLET | Freq: Three times a day (TID) | ORAL | 0 refills | Status: DC | PRN

## 2020-05-17 MED ORDER — ASPIRIN EC 325 MG PO TBEC: 325.0000 mg | DELAYED_RELEASE_TABLET | Freq: Every day | ORAL | 0 refills | Status: AC

## 2020-05-17 MED ORDER — LIDOCAINE HCL (CARDIAC) PF 100 MG/5ML IV SOSY
PREFILLED_SYRINGE | INTRAVENOUS | Status: DC | PRN
  Administered 2020-05-17: 50 mg via INTRATRACHEAL

## 2020-05-17 MED ORDER — OXYCODONE HCL 5 MG PO TABS: 5.0000 mg | ORAL_TABLET | Freq: Once | ORAL | Status: DC | PRN

## 2020-05-17 MED ORDER — FENTANYL CITRATE (PF) 100 MCG/2ML IJ SOLN: 25.0000 ug | INTRAMUSCULAR | Status: DC | PRN

## 2020-05-17 SURGICAL SUPPLY — 67 items
ADAPTER IRRIG TUBE 2 SPIKE SOL (ADAPTER) ×6 IMPLANT
ADH SKN CLS APL DERMABOND .7 (GAUZE/BANDAGES/DRESSINGS) ×1
ADPR TBG 2 SPK PMP STRL ASCP (ADAPTER) ×2
ANCH SUT SWLK 19.1X4.75 (Anchor) ×2 IMPLANT
ANCHOR ICONIX SPEED 2.3 (Anchor) ×2 IMPLANT
ANCHOR ICONIX SPEED 2.3MM (Anchor) ×1 IMPLANT
ANCHOR SUT BIO SW 4.75X19.1 (Anchor) ×6 IMPLANT
APL PRP STRL LF DISP 70% ISPRP (MISCELLANEOUS) ×1
BUR BR 5.5 12 FLUTE (BURR) ×3 IMPLANT
BUR RADIUS 4.0X18.5 (BURR) ×3 IMPLANT
CANNULA 5.75X7CM (CANNULA) ×1
CANNULA PART THRD DISP 5.75X7 (CANNULA) ×2 IMPLANT
CANNULA PARTIAL THREAD 2X7 (CANNULA) ×6 IMPLANT
CHLORAPREP W/TINT 26 (MISCELLANEOUS) ×3 IMPLANT
COOLER POLAR GLACIER W/PUMP (MISCELLANEOUS) ×3 IMPLANT
COVER LIGHT HANDLE UNIVERSAL (MISCELLANEOUS) ×6 IMPLANT
COVER WAND RF STERILE (DRAPES) ×3 IMPLANT
DERMABOND ADVANCED (GAUZE/BANDAGES/DRESSINGS) ×2
DERMABOND ADVANCED .7 DNX12 (GAUZE/BANDAGES/DRESSINGS) ×1 IMPLANT
DRAPE IMP U-DRAPE 54X76 (DRAPES) ×6 IMPLANT
DRAPE INCISE IOBAN 66X45 STRL (DRAPES) ×3 IMPLANT
DRAPE SHEET LG 3/4 BI-LAMINATE (DRAPES) ×3 IMPLANT
DRAPE U-SHAPE 48X52 POLY STRL (PACKS) ×3 IMPLANT
DRSG TEGADERM 4X4.75 (GAUZE/BANDAGES/DRESSINGS) ×9 IMPLANT
ELECT REM PT RETURN 9FT ADLT (ELECTROSURGICAL) ×3
ELECTRODE REM PT RTRN 9FT ADLT (ELECTROSURGICAL) ×1 IMPLANT
GAUZE SPONGE 2X2 8PLY NS LF (MISCELLANEOUS) ×6 IMPLANT
GAUZE SPONGE 2X2 NS 8PLY (MISCELLANEOUS) ×12
GAUZE SPONGE 4X4 12PLY STRL (GAUZE/BANDAGES/DRESSINGS) ×3 IMPLANT
GAUZE XEROFORM 1X8 LF (GAUZE/BANDAGES/DRESSINGS) ×3 IMPLANT
GLOVE BIO SURGEON STRL SZ7.5 (GLOVE) ×6 IMPLANT
GLOVE BIO SURGEON STRL SZ8 (GLOVE) ×3 IMPLANT
GLOVE BIOGEL PI IND STRL 8 (GLOVE) ×1 IMPLANT
GLOVE BIOGEL PI INDICATOR 8 (GLOVE) ×2
GOWN STRL REIN 2XL XLG LVL4 (GOWN DISPOSABLE) ×3 IMPLANT
GOWN STRL REUS W/ TWL LRG LVL3 (GOWN DISPOSABLE) ×1 IMPLANT
GOWN STRL REUS W/TWL LRG LVL3 (GOWN DISPOSABLE) ×3
IV LACTATED RINGER IRRG 3000ML (IV SOLUTION) ×75
IV LR IRRIG 3000ML ARTHROMATIC (IV SOLUTION) ×25 IMPLANT
KIT STABILIZATION SHOULDER (MISCELLANEOUS) ×3 IMPLANT
KIT TURNOVER KIT A (KITS) ×3 IMPLANT
MANIFOLD 4PT FOR NEPTUNE1 (MISCELLANEOUS) ×3 IMPLANT
MASK FACE SPIDER DISP (MASK) ×3 IMPLANT
MAT ABSORB  FLUID 56X50 GRAY (MISCELLANEOUS) ×4
MAT ABSORB FLUID 56X50 GRAY (MISCELLANEOUS) ×2 IMPLANT
NEEDLE SCORPION MULTI FIRE (NEEDLE) ×3 IMPLANT
PACK ARTHROSCOPY SHOULDER (MISCELLANEOUS) ×3 IMPLANT
PAD WRAPON POLAR SHDR UNIV (MISCELLANEOUS) ×1 IMPLANT
SET TUBE SUCT SHAVER OUTFL 24K (TUBING) ×3 IMPLANT
SET TUBE TIP INTRA-ARTICULAR (MISCELLANEOUS) ×3 IMPLANT
SUT ETHILON 3-0 FS-10 30 BLK (SUTURE) ×3
SUT LASSO 90 DEG SD STR (SUTURE) ×3 IMPLANT
SUT MNCRL 4-0 (SUTURE) ×3
SUT MNCRL 4-0 27XMFL (SUTURE) ×1
SUT VIC AB 0 CT1 36 (SUTURE) ×3 IMPLANT
SUT VIC AB 2-0 CT2 27 (SUTURE) ×3 IMPLANT
SUT VIC AB 3-0 SH 27 (SUTURE) ×3
SUT VIC AB 3-0 SH 27X BRD (SUTURE) ×1 IMPLANT
SUTURE EHLN 3-0 FS-10 30 BLK (SUTURE) ×1 IMPLANT
SUTURE MNCRL 4-0 27XMF (SUTURE) ×1 IMPLANT
SUTURE TAPE 1.3 40 TPR END (SUTURE) ×2 IMPLANT
SUTURETAPE 1.3 40 TPR END (SUTURE) ×6
SYSTEM FBRTK BICEPS 1.9 DRILL (Anchor) ×3 IMPLANT
TAPE MICROFOAM 4IN (TAPE) ×3 IMPLANT
TUBING ARTHRO INFLOW-ONLY STRL (TUBING) ×3 IMPLANT
WAND WEREWOLF FLOW 90D (MISCELLANEOUS) ×3 IMPLANT
WRAPON POLAR PAD SHDR UNIV (MISCELLANEOUS) ×3

## 2020-05-17 NOTE — Anesthesia Postprocedure Evaluation (Signed)
Anesthesia Post Note  Patient: Shawn Gordon  Procedure(s) Performed: Right shoulder arthroscopic subscapularis repair, mini-open supraspinatus repair, subacromial decompression, distal clavicle excision, and open biceps tenodesis (Right )     Patient location during evaluation: PACU Anesthesia Type: General Level of consciousness: awake and alert and oriented Pain management: satisfactory to patient Vital Signs Assessment: post-procedure vital signs reviewed and stable Respiratory status: spontaneous breathing, nonlabored ventilation and respiratory function stable Cardiovascular status: blood pressure returned to baseline and stable Postop Assessment: Adequate PO intake and No signs of nausea or vomiting Anesthetic complications: no   No complications documented.  Raliegh Ip

## 2020-05-17 NOTE — Op Note (Signed)
SURGERY DATE: 05/17/2020  PRE-OP DIAGNOSIS:  1. Right rotator cuff tear (supraspinatus) 2. Right acromioclavicular joint osteoarthritis 3. Right subacromial impingement 4. Right biceps tendinopathy  POST-OP DIAGNOSIS: 1. Right rotator cuff tear (subscapularis, supraspinatus) 2. Right acromioclavicular joint osteoarthritis 3. Right subacromial impingement 4. Right biceps tendinopathy  PROCEDURES:  1. Right arthroscopic rotator cuff repair (subscapularis) 2. Right mini-open rotator cuff repair (supraspinatus) 3. Right open biceps tenodesis 4. Right arthroscopic distal clavicle excision 5. Right arthroscopic extensive debridement of shoulder (glenohumeral and subacromial spaces) 6. Right arthroscopic subacromial decompression  SURGEON: Cato Mulligan, MD  ASSISTANT: Doristine Locks, PA-S  ANESTHESIA: Gen with Exparil interscalene block  ESTIMATED BLOOD LOSS: 25cc  DRAINS:  none  TOTAL IV FLUIDS: per anesthesia   SPECIMENS: none  IMPLANTS:  - Arthrex 4.3mm SwiveLock x 2 - Arthrex FiberTak Suture Anchor - Double Loaded x1 - Iconix SPEED double loaded with 1.2 and 2.43mm tape x 1   OPERATIVE FINDINGS:  Examination under anesthesia: A careful examination under anesthesia was performed.  Passive range of motion was: FF: 150; ER at side: 45; ER in abduction: 90; IR in abduction: 40.  Anterior load shift: NT.  Posterior load shift: NT.  Sulcus in neutral: NT.  Sulcus in ER: NT.    Intra-operative findings: A thorough arthroscopic examination of the shoulder was performed.  The findings are: 1. Biceps tendon: Significant thickening and tendinopathy with split thickness tear 2. Superior labrum: Degenerative 3. Posterior labrum and capsule: normal 4. Inferior capsule and inferior recess: normal 5. Glenoid cartilage surface: Grade 2 degenerative changes to posterior and inferior glenoid 6. Supraspinatus attachment: full-thickness tear anteriorly 7. Posterior rotator cuff  attachment: Normal 8. Humeral head articular cartilage: normal 9. Rotator interval: significant synovitis 10: Subscapularis tendon: full-thickness tear of superior subscapularis 11. Anterior labrum: degenerative 12. IGHL: significant synovitis around IGHL  OPERATIVE REPORT:   Indications for procedure: Shawn Gordon is a 59 y.o. male with over 1 year of right shoulder pain. Since that time, he has failed non-operative management. Clinical exam and MRI were suggestive of rotator cuff tear; subacromial impingement; and acromioclavicular joint arthritis.  Given failure of nonoperative management and full-thickness tear, surgical management was recommended. After discussion of risks, benefits, and alternatives to surgery, the patient elected to proceed. Of note, he has had contralateral rotator cuff repair performed by me on 09/01/2019.  He is doing well from the procedure.  Procedure in detail:  I identified LATHAN GIESELMAN in the pre-operative holding area.  I marked the operative shoulder with my initials. I reviewed the risks and benefits of the proposed surgical intervention, and the patient (and/or patient's guardian) wished to proceed.  Anesthesia was then performed with an Exparil interscalene block.  The patient was transferred to the operative suite and placed in the beach chair position.    SCDs were placed on the lower extremities. Appropriate IV antibiotics were administered prior to incision. The operative upper extremity was then prepped and draped in standard fashion. A time out was performed confirming the correct extremity, correct patient, and correct procedure.   I then created a standard posterior portal with an 11 blade. The glenohumeral joint was easily entered with a blunt trochar and the arthroscope introduced. The findings of diagnostic arthroscopy are described above.  A standard anterior portal was made.  I debrided degenerative tissue including the synovitic tissue  about the rotator interval and IGHL as well as the anterior and superior labrum. I then coagulated the inflamed synovium to  obtain hemostasis and reduce the risk of post-operative swelling using an Arthrocare radiofrequency device. The biceps tendon was cut at its insertion on the superior labrum with arthroscopic scissors.  The subscapularis tear was identified.  A superior anterolateral portal was made under needle localization.  A 7 mm cannula was placed.  The, comma tissue indicating the superolateral border of the subscapularis was identified readily.  The tip of the coracoid as well as the conjoined tendon and coracoacromial ligaments were visualized after debriding rotator interval tissue.  Tissue about the subscapularis was released anteriorly, superiorly, and posteriorly to allow for improved visualization and allow for additional mobilization.  The subscapularis was not significantly retracted.  The lesser tuberosity footprint was prepared with electrocautery.  A Scorpion suture passing device was used to pass 2 SutureTapes in a mattress fashion through the subscapularis tendon.  Additionally, a suture lasso was used to pass one of the strands of suture tape.  These were passed from the anterolateral portal and after passage the sutures were retrieved from the anterior portal.  These were passed through a 4.75 mm SwiveLock and placed into the lesser tuberosity at the footprint of the subscapularis tendon with the arm in a neutral position.  This appropriately reduced the subscapularis tear.  The arm was then internally and externally rotated and the subscapularis was noted to move appropriately with rotation.  Next, the arthroscope was then introduced into the subacromial space. A direct lateral portal was created with an 11-blade after spinal needle localization. An extensive subacromial bursectomy was performed using a combination of the shaver and Arthrocare wand. The entire acromial undersurface  was exposed and the CA ligament was subperiosteally elevated to expose the anterior acromial hook. A 5.73mm barrel burr was used to create a flat anterior and lateral aspect of the acromion, converting it from a Type 2 to a Type 1 acromion. Care was made to keep the deltoid fascia intact.  I then turned my attention to the arthroscopic distal clavicle excision. I identified the acromioclavicular joint. Surrounding bursal tissue was debrided and the edges of the joint were identified. I used the 5.65mm barrel burr to remove the distal clavicle parallel to the edge of the acromion. I was able to fit two widths of the burr into the space between the distal clavicle and acromion, signifying that I had removed ~35mm of distal clavicle. This was confirmed by viewing anteriorly and introducing a probe with measuring marks from the lateral portal. Hemostasis was achieved with an Arthrocare wand. Fluid was evacuated from the shoulder.  The supraspinatus tear was visualized and decision was made to proceed with a mini open approach.  A longitudinal incision from the anterolateral acromion ~6cm in length was made overlying the raphe between the anterior and middle heads of the deltoid.  This incision also incorporated the anterolateral portal.  The raphe was identified and it was incised. The subacromial space was identified. Any remaining bursa was excised. The rotator cuff tear involving the supraspinatus was identified. It was an V-shaped tear with the apex at the level of the articular margin.   We then turned our attention to the biceps tenodesis. The arm was externally rotated.  The bicipital groove was identified.  A 15 blade was used to make a cut overlying the biceps tendon, and the tendon was removed using a right angle clamp.  The base of the bicipital groove was identified and cleared of soft tissue.  A FiberTak anchor was placed in the bicipital groove.  The biceps tendon was held at the appropriate amount of  tension.  One set of sutures was passed through the biceps anchor with one limb passed in a simple fashion and the second limb passed in a simple plus locking stitch pattern.  This was repeated for the other set of sutures.  This construct allowed for shuttling the biceps tendon down to the bone.  The sutures were tied and cut.  The diseased portion of the proximal biceps was then excised.  The arm was then internally rotated.  The rotator cuff footprint was cleared of soft tissue and the footprint was smoothed and bleeding bone over the footprint was created with a rongeur.  An Iconix SPEED anchor was placed just lateral to the articular margin.  Cuff could be more appropriately reduced with placing 3 side-to-side suture tapes in a margin convergence fashion. These were placed along the length of the rotator cuff tear starting medially and working laterally. Next, all 4 limbs of suture from the medial row anchor were passed appropriately through the supraspinatus with a scorpion suture passer.  These were then placed into a SwiveLock anchor for lateral row fixation.  This allowed for excellent reapproximation and compression of the rotator cuff over its footprint. The construct was stable with external and internal rotation.  The wound was thoroughly irrigated.  The deltoid split was closed with 0 Vicryl.  The subdermal layer was closed with 2-0 Vicryl.  The skin was closed with 4-0 Monocryl and Dermabond. The portals were closed with 3-0 Nylon. Xeroform was applied to the incisions. A sterile dressing was applied, followed by a Polar Care sleeve and a SlingShot shoulder immobilizer/sling. The patient was awakened from anesthesia without difficulty and was transferred to the PACU in stable condition.     COMPLICATIONS: none  DISPOSITION: plan for discharge home after recovery in PACU   POSTOPERATIVE PLAN: Remain in sling (except hygiene and elbow/wrist/hand RoM exercises as instructed by PT) x 6 weeks  and NWB for this time. PT to begin 3-4 days after surgery.  Use large rotator cuff repair rehab protocol with subscapularis repair.  ASA 325mg  daily x 2 weeks for DVT ppx.

## 2020-05-17 NOTE — Discharge Instructions (Signed)
Post-Op Instructions - Rotator Cuff Repair  1. Bracing: You will wear a shoulder immobilizer or sling for 6 weeks.   2. Driving: No driving for 3 weeks post-op. When driving, do not wear the immobilizer. Ideally, we recommend no driving for 6 weeks while sling is in place as one arm will be immobilized.   3. Activity: No active lifting for 2 months. Wrist, hand, and elbow motion only. Avoid lifting the upper arm away from the body except for hygiene. You are permitted to bend and straighten the elbow passively only (no active elbow motion). You may use your hand and wrist for typing, writing, and managing utensils (cutting food). Do not lift more than a coffee cup for 8 weeks.  When sleeping or resting, inclined positions (recliner chair or wedge pillow) and a pillow under the forearm for support may provide better comfort for up to 4 weeks.  Avoid long distance travel for 4 weeks.  Return to normal activities after rotator cuff repair repair normally takes 6 months on average. If rehab goes very well, may be able to do most activities at 4 months, except overhead or contact sports.  4. Physical Therapy: Begins 3-4 days after surgery, and proceed 1 time per week for the first 6 weeks, then 1-2 times per week from weeks 6-20 post-op.  5. Medications:  - You will be provided a prescription for narcotic pain medicine. After surgery, take 1-2 narcotic tablets every 4 hours if needed for severe pain.  - A prescription for anti-nausea medication will be provided in case the narcotic medicine causes nausea - take 1 tablet every 6 hours only if nauseated.   - Take tylenol 1000 mg (2 Extra Strength tablets or 3 regular strength) every 8 hours for pain.  May decrease or stop tylenol 5 days after surgery if you are having minimal pain. - Take ASA 325mg/day x 2 weeks to help prevent DVTs/PEs (blood clots).  - DO NOT take ANY nonsteroidal anti-inflammatory pain medications (Advil, Motrin, Ibuprofen, Aleve,  Naproxen, or Naprosyn). These medicines can inhibit healing of your shoulder repair.    If you are taking prescription medication for anxiety, depression, insomnia, muscle spasm, chronic pain, or for attention deficit disorder, you are advised that you are at a higher risk of adverse effects with use of narcotics post-op, including narcotic addiction/dependence, depressed breathing, death. If you use non-prescribed substances: alcohol, marijuana, cocaine, heroin, methamphetamines, etc., you are at a higher risk of adverse effects with use of narcotics post-op, including narcotic addiction/dependence, depressed breathing, death. You are advised that taking > 50 morphine milligram equivalents (MME) of narcotic pain medication per day results in twice the risk of overdose or death. For your prescription provided: oxycodone 5 mg - taking more than 6 tablets per day would result in > 50 morphine milligram equivalents (MME) of narcotic pain medication. Be advised that we will prescribe narcotics short-term, for acute post-operative pain only - 3 weeks for major operations such as shoulder repair/reconstruction surgeries.     6. Post-Op Appointment:  Your first post-op appointment will be 10-14 days post-op.  7. Work or School: For most, but not all procedures, we advise staying out of work or school for at least 1 to 2 weeks in order to recover from the stress of surgery and to allow time for healing.   If you need a work or school note this can be provided.   8. Smoking: If you are a smoker, you need to refrain from   smoking in the postoperative period. The nicotine in cigarettes will inhibit healing of your shoulder repair and decrease the chance of successful repair. Similarly, nicotine containing products (gum, patches) should be avoided.   Post-operative Brace: Apply and remove the brace you received as you were instructed to at the time of fitting and as described in detail as the brace's  instructions for use indicate.  Wear the brace for the period of time prescribed by your physician.  The brace can be cleaned with soap and water and allowed to air dry only.  Should the brace result in increased pain, decreased feeling (numbness/tingling), increased swelling or an overall worsening of your medical condition, please contact your doctor immediately.  If an emergency situation occurs as a result of wearing the brace after normal business hours, please dial 911 and seek immediate medical attention.  Let your doctor know if you have any further questions about the brace issued to you. Refer to the shoulder sling instructions for use if you have any questions regarding the correct fit of your shoulder sling.  [employer] Customer Care for Troubleshooting: [phone]  Video that illustrates how to properly use a shoulder sling: "Instructions for Proper Use of an Orthopaedic Sling" https://www.youtube.com/watch?v=AHZpn_Xo45w        General Anesthesia, Adult, Care After This sheet gives you information about how to care for yourself after your procedure. Your health care provider may also give you more specific instructions. If you have problems or questions, contact your health care provider. What can I expect after the procedure? After the procedure, the following side effects are common:  Pain or discomfort at the IV site.  Nausea.  Vomiting.  Sore throat.  Trouble concentrating.  Feeling cold or chills.  Weak or tired.  Sleepiness and fatigue.  Soreness and body aches. These side effects can affect parts of the body that were not involved in surgery. Follow these instructions at home:  For at least 24 hours after the procedure:  Have a responsible adult stay with you. It is important to have someone help care for you until you are awake and alert.  Rest as needed.  Do not: ? Participate in activities in which you could fall or become injured. ? Drive. ? Use  heavy machinery. ? Drink alcohol. ? Take sleeping pills or medicines that cause drowsiness. ? Make important decisions or sign legal documents. ? Take care of children on your own. Eating and drinking  Follow any instructions from your health care provider about eating or drinking restrictions.  When you feel hungry, start by eating small amounts of foods that are soft and easy to digest (bland), such as toast. Gradually return to your regular diet.  Drink enough fluid to keep your urine pale yellow.  If you vomit, rehydrate by drinking water, juice, or clear broth. General instructions  If you have sleep apnea, surgery and certain medicines can increase your risk for breathing problems. Follow instructions from your health care provider about wearing your sleep device: ? Anytime you are sleeping, including during daytime naps. ? While taking prescription pain medicines, sleeping medicines, or medicines that make you drowsy.  Return to your normal activities as told by your health care provider. Ask your health care provider what activities are safe for you.  Take over-the-counter and prescription medicines only as told by your health care provider.  If you smoke, do not smoke without supervision.  Keep all follow-up visits as told by your health care provider. This   is important. Contact a health care provider if:  You have nausea or vomiting that does not get better with medicine.  You cannot eat or drink without vomiting.  You have pain that does not get better with medicine.  You are unable to pass urine.  You develop a skin rash.  You have a fever.  You have redness around your IV site that gets worse. Get help right away if:  You have difficulty breathing.  You have chest pain.  You have blood in your urine or stool, or you vomit blood. Summary  After the procedure, it is common to have a sore throat or nausea. It is also common to feel tired.  Have a  responsible adult stay with you for the first 24 hours after general anesthesia. It is important to have someone help care for you until you are awake and alert.  When you feel hungry, start by eating small amounts of foods that are soft and easy to digest (bland), such as toast. Gradually return to your regular diet.  Drink enough fluid to keep your urine pale yellow.  Return to your normal activities as told by your health care provider. Ask your health care provider what activities are safe for you. This information is not intended to replace advice given to you by your health care provider. Make sure you discuss any questions you have with your health care provider. Document Revised: 07/09/2017 Document Reviewed: 02/19/2017 Elsevier Patient Education  2020 [employer]  

## 2020-05-17 NOTE — Anesthesia Procedure Notes (Signed)
Procedure Name: LMA Insertion Date/Time: 05/17/2020 10:58 AM Performed by: Mayme Genta, CRNA Pre-anesthesia Checklist: Patient identified, Emergency Drugs available, Suction available, Timeout performed and Patient being monitored Patient Re-evaluated:Patient Re-evaluated prior to induction Oxygen Delivery Method: Circle system utilized Preoxygenation: Pre-oxygenation with 100% oxygen Induction Type: IV induction LMA: LMA inserted LMA Size: 4.0 Number of attempts: 1 Placement Confirmation: positive ETCO2 and breath sounds checked- equal and bilateral Tube secured with: Tape

## 2020-05-17 NOTE — H&P (Signed)
Paper H&P to be scanned into permanent record. H&P reviewed. No significant changes noted.  

## 2020-05-17 NOTE — Anesthesia Preprocedure Evaluation (Signed)
Anesthesia Evaluation  Patient identified by MRN, date of birth, ID band Patient awake    Reviewed: Allergy & Precautions, H&P , NPO status , Patient's Chart, lab work & pertinent test results  History of Anesthesia Complications Negative for: history of anesthetic complications  Airway Mallampati: II  TM Distance: >3 FB Neck ROM: full    Dental no notable dental hx.    Pulmonary asthma , former smoker,    Pulmonary exam normal breath sounds clear to auscultation       Cardiovascular Exercise Tolerance: Good negative cardio ROS Normal cardiovascular exam Rhythm:regular Rate:Normal     Neuro/Psych PSYCHIATRIC DISORDERS Depression    GI/Hepatic Neg liver ROS, GERD  Medicated and Controlled,  Endo/Other  diabetes, Well Controlled, Type 2, Oral Hypoglycemic AgentsMorbid obesity  Renal/GU negative Renal ROS     Musculoskeletal negative musculoskeletal ROS (+)   Abdominal   Peds  Hematology   Anesthesia Other Findings   Reproductive/Obstetrics                             Anesthesia Physical  Anesthesia Plan  ASA: II  Anesthesia Plan: General LMA   Post-op Pain Management:  Regional for Post-op pain   Induction: Intravenous  PONV Risk Score and Plan: 2 and Treatment may vary due to age or medical condition, Ondansetron and Dexamethasone  Airway Management Planned: LMA  Additional Equipment: None  Intra-op Plan:   Post-operative Plan:   Informed Consent: I have reviewed the patients History and Physical, chart, labs and discussed the procedure including the risks, benefits and alternatives for the proposed anesthesia with the patient or authorized representative who has indicated his/her understanding and acceptance.     Dental Advisory Given  Plan Discussed with: CRNA  Anesthesia Plan Comments:         Anesthesia Quick Evaluation

## 2020-05-17 NOTE — Transfer of Care (Signed)
Immediate Anesthesia Transfer of Care Note  Patient: Shawn Gordon  Procedure(s) Performed: Right shoulder arthroscopic subscapularis repair, mini-open supraspinatus repair, subacromial decompression, distal clavicle excision, and open biceps tenodesis (Right )  Patient Location: PACU  Anesthesia Type: General LMA  Level of Consciousness: awake, alert  and patient cooperative  Airway and Oxygen Therapy: Patient Spontanous Breathing and Patient connected to supplemental oxygen  Post-op Assessment: Post-op Vital signs reviewed, Patient's Cardiovascular Status Stable, Respiratory Function Stable, Patent Airway and No signs of Nausea or vomiting  Post-op Vital Signs: Reviewed and stable  Complications: No complications documented.

## 2020-05-17 NOTE — Progress Notes (Signed)
Assisted Mike Stella ANMD  with right, ultrasound guided, interscalene  block. Side rails up, monitors on throughout procedure. See vital signs in flow sheet. Tolerated Procedure well.  

## 2020-05-17 NOTE — Anesthesia Procedure Notes (Signed)
Anesthesia Regional Block: Interscalene brachial plexus block   Pre-Anesthetic Checklist: ,, timeout performed, Correct Patient, Correct Site, Correct Laterality, Correct Procedure, Correct Position, site marked, Risks and benefits discussed,  Surgical consent,  Pre-op evaluation,  At surgeon's request and post-op pain management  Laterality: Right  Prep: chloraprep       Needles:  Injection technique: Single-shot  Needle Type: Stimiplex     Needle Length: 10cm  Needle Gauge: 21     Additional Needles:   Procedures:,,,, ultrasound used (permanent image in chart),,,,  Narrative:  Start time: 05/17/2020 10:40 AM End time: 05/17/2020 10:46 AM Injection made incrementally with aspirations every 5 mL.  Performed by: Personally  Anesthesiologist: Ronelle Nigh, MD  Additional Notes: Functioning IV was confirmed and monitors applied. Ultrasound guidance: relevant anatomy identified, needle position confirmed, local anesthetic spread visualized around nerve(s)., vascular puncture avoided.  Image printed for medical record.  Negative aspiration and no paresthesias; incremental administration of local anesthetic. The patient tolerated the procedure well. Vitals signes recorded in RN notes.

## 2020-05-19 ENCOUNTER — Other Ambulatory Visit: Payer: Self-pay | Admitting: Family Medicine

## 2020-05-19 DIAGNOSIS — E1169 Type 2 diabetes mellitus with other specified complication: Secondary | ICD-10-CM

## 2020-05-19 NOTE — Telephone Encounter (Signed)
Requested medications are due for refill today yes  Requested medications are on the active medication list yes  Last refill 8/6  Last visit 02/2020  Future visit scheduled 05/2020  Notes to clinic Lipid labs were ordered in August but not drawn, therefor fails protocol of labs within 360 days.

## 2020-05-27 ENCOUNTER — Other Ambulatory Visit: Payer: Self-pay | Admitting: *Deleted

## 2020-05-27 DIAGNOSIS — N401 Enlarged prostate with lower urinary tract symptoms: Secondary | ICD-10-CM

## 2020-05-27 DIAGNOSIS — E785 Hyperlipidemia, unspecified: Secondary | ICD-10-CM

## 2020-05-27 DIAGNOSIS — Z0000 Encounter for general adult medical examination without abnormal findings: Secondary | ICD-10-CM

## 2020-05-27 DIAGNOSIS — E1169 Type 2 diabetes mellitus with other specified complication: Secondary | ICD-10-CM

## 2020-05-27 DIAGNOSIS — E1165 Type 2 diabetes mellitus with hyperglycemia: Secondary | ICD-10-CM

## 2020-05-27 DIAGNOSIS — N138 Other obstructive and reflux uropathy: Secondary | ICD-10-CM

## 2020-05-28 ENCOUNTER — Other Ambulatory Visit: Payer: Self-pay

## 2020-05-28 ENCOUNTER — Other Ambulatory Visit: Payer: Managed Care, Other (non HMO)

## 2020-05-29 LAB — CBC WITH DIFFERENTIAL/PLATELET
Absolute Monocytes: 537 cells/uL (ref 200–950)
Basophils Absolute: 61 cells/uL (ref 0–200)
Basophils Relative: 0.9 %
Eosinophils Absolute: 258 cells/uL (ref 15–500)
Eosinophils Relative: 3.8 %
HCT: 39.2 % (ref 38.5–50.0)
Hemoglobin: 12.9 g/dL — ABNORMAL LOW (ref 13.2–17.1)
Lymphs Abs: 1387 cells/uL (ref 850–3900)
MCH: 28.2 pg (ref 27.0–33.0)
MCHC: 32.9 g/dL (ref 32.0–36.0)
MCV: 85.6 fL (ref 80.0–100.0)
MPV: 10.4 fL (ref 7.5–12.5)
Monocytes Relative: 7.9 %
Neutro Abs: 4556 cells/uL (ref 1500–7800)
Neutrophils Relative %: 67 %
Platelets: 335 10*3/uL (ref 140–400)
RBC: 4.58 10*6/uL (ref 4.20–5.80)
RDW: 12.7 % (ref 11.0–15.0)
Total Lymphocyte: 20.4 %
WBC: 6.8 10*3/uL (ref 3.8–10.8)

## 2020-05-29 LAB — PSA: PSA: 0.85 ng/mL (ref <=4.0)

## 2020-05-29 LAB — COMPLETE METABOLIC PANEL WITH GFR
AG Ratio: 1.6 (calc) (ref 1.0–2.5)
ALT: 28 U/L (ref 9–46)
AST: 20 U/L (ref 10–35)
Albumin: 4.2 g/dL (ref 3.6–5.1)
Alkaline phosphatase (APISO): 84 U/L (ref 35–144)
BUN: 20 mg/dL (ref 7–25)
CO2: 24 mmol/L (ref 20–32)
Calcium: 9.2 mg/dL (ref 8.6–10.3)
Chloride: 101 mmol/L (ref 98–110)
Creat: 0.74 mg/dL (ref 0.70–1.25)
GFR, Est African American: 116 mL/min/{1.73_m2} (ref >=60)
GFR, Est Non African American: 100 mL/min/{1.73_m2} (ref >=60)
Globulin: 2.6 g/dL (calc) (ref 1.9–3.7)
Glucose, Bld: 148 mg/dL — ABNORMAL HIGH (ref 65–99)
Potassium: 4.5 mmol/L (ref 3.5–5.3)
Sodium: 136 mmol/L (ref 135–146)
Total Bilirubin: 0.3 mg/dL (ref 0.2–1.2)
Total Protein: 6.8 g/dL (ref 6.1–8.1)

## 2020-05-29 LAB — LIPID PANEL
Cholesterol: 132 mg/dL (ref <200)
HDL: 43 mg/dL (ref >=40)
LDL Cholesterol (Calc): 61 mg/dL (calc)
Non-HDL Cholesterol (Calc): 89 mg/dL (calc) (ref <130)
Total CHOL/HDL Ratio: 3.1 (calc) (ref <5.0)
Triglycerides: 216 mg/dL — ABNORMAL HIGH (ref <150)

## 2020-05-29 LAB — HEMOGLOBIN A1C
Hgb A1c MFr Bld: 7.5 % of total Hgb — ABNORMAL HIGH (ref <5.7)
Mean Plasma Glucose: 169 (calc)
eAG (mmol/L): 9.3 (calc)

## 2020-05-29 LAB — TSH: TSH: 1.13 mIU/L (ref 0.40–4.50)

## 2020-06-04 ENCOUNTER — Ambulatory Visit (INDEPENDENT_AMBULATORY_CARE_PROVIDER_SITE_OTHER): Payer: Managed Care, Other (non HMO) | Admitting: Family Medicine

## 2020-06-04 ENCOUNTER — Other Ambulatory Visit: Payer: Self-pay

## 2020-06-04 ENCOUNTER — Encounter: Payer: Self-pay | Admitting: Family Medicine

## 2020-06-04 VITALS — BP 111/68 | HR 98 | Temp 97.4°F | Resp 16 | Ht 69.5 in | Wt 263.0 lb

## 2020-06-04 DIAGNOSIS — Z0000 Encounter for general adult medical examination without abnormal findings: Secondary | ICD-10-CM

## 2020-06-04 DIAGNOSIS — N401 Enlarged prostate with lower urinary tract symptoms: Secondary | ICD-10-CM

## 2020-06-04 DIAGNOSIS — E1169 Type 2 diabetes mellitus with other specified complication: Secondary | ICD-10-CM | POA: Diagnosis not present

## 2020-06-04 DIAGNOSIS — N138 Other obstructive and reflux uropathy: Secondary | ICD-10-CM

## 2020-06-04 DIAGNOSIS — F3342 Major depressive disorder, recurrent, in full remission: Secondary | ICD-10-CM

## 2020-06-04 DIAGNOSIS — E1165 Type 2 diabetes mellitus with hyperglycemia: Secondary | ICD-10-CM

## 2020-06-04 DIAGNOSIS — E119 Type 2 diabetes mellitus without complications: Secondary | ICD-10-CM

## 2020-06-04 DIAGNOSIS — E785 Hyperlipidemia, unspecified: Secondary | ICD-10-CM

## 2020-06-04 MED ORDER — LISINOPRIL 2.5 MG PO TABS: 2.5000 mg | ORAL_TABLET | Freq: Every day | ORAL | 3 refills | Status: DC

## 2020-06-04 MED ORDER — BYDUREON BCISE 2 MG/0.85ML ~~LOC~~ AUIJ: AUTO-INJECTOR | SUBCUTANEOUS | 3 refills | Status: DC

## 2020-06-04 NOTE — Patient Instructions (Addendum)
Thank you for coming to the office today.  1. Chemistry - Normal results, including electrolytes, kidney and liver function. - Elevated fasting blood sugar   2. Hemoglobin A1c (Diabetes) - 7.5, stable from last time.  3. PSA Prostate Cancer Screening - 0.85, negative.  4. TSH Thyroid Function Tests - Normal.  5. CBC Blood Counts - Slightly lower Hgb to 12.9, previous 13.0 otherwise Normal no other significant abnormality  6. Cholesterol - Controlled on Atorvastatin 10mg , LDL at 61, mild elevated Triglycerides 216 similar to previous   Please schedule a Follow-up Appointment to: Return in about 6 months (around 12/02/2020) for 6 month follow-up DM A1c, med adjust.  If you have any other questions or concerns, please feel free to call the office or send a message through [place]. You may also schedule an earlier appointment if necessary.  Additionally, you may be receiving a survey about your experience at our office within a few days to 1 week by e-mail or mail. We value your feedback.  [name], DO [place]

## 2020-06-04 NOTE — Assessment & Plan Note (Signed)
BPH Improved on Tamsulosin

## 2020-06-04 NOTE — Assessment & Plan Note (Signed)
Stable - A1c 7.5  On GLP1 improved adherence Complications - other including hyperlipidemia, GERD, obesity - increases risk of future cardiovascular complications   Plan:  1. Continue Bydureon BCise 2mg  weekly inj - re order - Continue Metformin 1000mg  BID Goal w/ wt loss to reduce meds 2. Encourage improved lifestyle - low carb, low sugar diet, reduce portion size, continue improving regular exercise 3. Check CBG , bring log to next visit for review

## 2020-06-04 NOTE — Assessment & Plan Note (Signed)
Controlled cholesterol on statin and lifestyle Elevated TG however w/ DM Last lipid panel 05/2020 The 10-year ASCVD risk score Mikey Bussing DC Jr., et al., 2013) is: 9.7%  Plan: 1. Continue current meds - Atorvastatin 10mg  2. Encourage improved lifestyle - low carb/cholesterol, reduce portion size, continue improving regular exercise

## 2020-06-04 NOTE — Assessment & Plan Note (Signed)
Chronic major depression in remission See PHQ Continue SSRI Fluoxetine

## 2020-06-04 NOTE — Progress Notes (Signed)
Subjective:    Patient ID: Shawn Gordon, male    DOB: Apr 26, 1961, 59 y.o.   MRN: [id number]  Shawn Gordon is a 59 y.o. male presenting on 06/04/2020 for Annual Exam   HPI   Here for Annual Physical and Lab Review.  CHRONIC DM, Type 2with hyperglycemia, uncontrolled/ Morbid Obesity On GLP1 Last A1c 7.5 CBGs:Limited CBG results Meds:Metformin 1000mg  BID, Bydureon BCise 2mg  weekly inj Reports good compliance. Tolerating well w/o side-effects Currently on ACEi Lifestyle: Reduced carbs. Walking more Admits R toe tingling if in wrong position - has improved some not having other areas of numbness tingling or pain. Present there for few years. Does not affect him walking and gait. Denies hypoglycemia, polyuria, visual changes, numbness.  HYPERLIPIDEMIA: - Reports no concerns. Last lipid panel 05/2020, controlled LDL 61, elevated TG - Currently taking Atorvastatin 10mg , tolerating well without side effects or myalgias  Major Depression, recurrent chronic, in full remission Chronic problem See PHQ On Fluoxetine 40mg  daily  S/p R Shoulder Surgery - rotator cuff repair He has arm in brace.  BPH On Tamsulosin  Health Maintenance: UTD COVID19 vaccine. Due for Flu Vaccine, declines today.  PSA Prostate Cancer Screening - 0.85, negative.   Depression screen [facility] 2/9 06/04/2020 03/04/2020 05/08/2019  Decreased Interest 0 3 0  Down, Depressed, Hopeless 0 2 0  PHQ - 2 Score 0 5 0  Altered sleeping 0 0 0  Tired, decreased energy 1 3 0  Change in appetite 0 3 0  Feeling bad or failure about yourself  0 0 0  Trouble concentrating 0 1 0  Moving slowly or fidgety/restless 0 0 0  Suicidal thoughts 0 0 0  PHQ-9 Score 1 12 0  Difficult doing work/chores Not difficult at all Very difficult Not difficult at all   GAD 7 : Generalized Anxiety Score 06/04/2020 03/04/2020  Nervous, Anxious, on Edge 0 2  Control/stop worrying 0 1  Worry too much - different things 0 2    Trouble relaxing 0 1  Restless 0 1  Easily annoyed or irritable 0 1  Afraid - awful might happen 0 0  Total GAD 7 Score 0 8  Anxiety Difficulty Not difficult at all Somewhat difficult     Past Medical History:  Diagnosis Date  . Arthritis    Lower spine, wrist, hips(pre-replacement), shoulders  . Asthma   . Back pain   . Diabetes mellitus, type 2 ([place])   . GERD (gastroesophageal reflux disease)   . Presence of dental prosthetic device    2 implants.  1 top, 1 bottom   Past Surgical History:  Procedure Laterality Date  . SHOULDER ARTHROSCOPY WITH SUBACROMIAL DECOMPRESSION AND OPEN ROTATOR C Right 05/17/2020   Procedure: Right shoulder arthroscopic subscapularis repair, mini-open supraspinatus repair, subacromial decompression, distal clavicle excision, and open biceps tenodesis;  Surgeon: Leim Fabry, MD;  Location: [place];  Service: Orthopedics;  Laterality: Right;  . SHOULDER ARTHROSCOPY WITH SUBACROMIAL DECOMPRESSION, ROTATOR CUFF REPAIR AND BICEP TENDON REPAIR Left 09/01/2019   Procedure: SHOULDER ARTHROSCOPY WITH SUBACROMIAL DECOMPRESSION, ROTATOR CUFF REPAIR AND BICEP TENDON REPAIR, SUBSACAPULAIRS REPAIR, DISTAL CLAVICLE EXCISION;  Surgeon: Leim Fabry, MD;  Location: [place];  Service: Orthopedics;  Laterality: Left;  Diabetic - oral and injectable meds [name]  . TOTAL HIP ARTHROPLASTY Bilateral 2007   Left - 2007, right - 2008   Social History   Socioeconomic History  . Marital status: Single    Spouse name: Not on  file  . Number of children: Not on file  . Years of education: Not on file  . Highest education level: Not on file  Occupational History  . Occupation: Product/process development scientist    Comment: Night-shift  Tobacco Use  . Smoking status: Former Smoker    Packs/day: 2.00    Types: Cigarettes    Start date: 04/19/1978    Quit date: 02/26/2001    Years since quitting: 19.2  . Smokeless tobacco: Former Network engineer  .  Vaping Use: Never used  Substance and Sexual Activity  . Alcohol use: No    Alcohol/week: 0.0 standard drinks  . Drug use: No  . Sexual activity: Not on file  Other Topics Concern  . Not on file  Social History Narrative  . Not on file   Social Determinants of Health   Financial Resource Strain:   . Difficulty of Paying Living Expenses: Not on file  Food Insecurity:   . Worried About Charity fundraiser in the Last Year: Not on file  . Ran Out of Food in the Last Year: Not on file  Transportation Needs:   . Lack of Transportation (Medical): Not on file  . Lack of Transportation (Non-Medical): Not on file  Physical Activity:   . Days of Exercise per Week: Not on file  . Minutes of Exercise per Session: Not on file  Stress:   . Feeling of Stress : Not on file  Social Connections:   . Frequency of Communication with Friends and Family: Not on file  . Frequency of Social Gatherings with Friends and Family: Not on file  . Attends Religious Services: Not on file  . Active Member of Clubs or Organizations: Not on file  . Attends Archivist Meetings: Not on file  . Marital Status: Not on file  Intimate Partner Violence:   . Fear of Current or Ex-Partner: Not on file  . Emotionally Abused: Not on file  . Physically Abused: Not on file  . Sexually Abused: Not on file   Family History  Problem Relation Age of Onset  . Stroke Mother   . Stroke Father   . Cancer Father   . Hypertension Paternal Grandfather    Current Outpatient Medications on File Prior to Visit  Medication Sig  . acetaminophen (TYLENOL) 500 MG tablet Take 2 tablets (1,000 mg total) by mouth every 8 (eight) hours.  Marland Kitchen acetaminophen (TYLENOL) 500 MG tablet Take 2 tablets (1,000 mg total) by mouth every 8 (eight) hours.  Marland Kitchen atorvastatin (LIPITOR) 10 MG tablet TAKE 1 TABLET BY MOUTH EVERYDAY AT BEDTIME  . B Complex Vitamins (B COMPLEX PO) Take by mouth daily.  . Cholecalciferol (VITAMIN D3 PO) Take by  mouth daily.  Marland Kitchen FLUoxetine (PROZAC) 40 MG capsule Take 1 capsule (40 mg total) by mouth daily.  Marland Kitchen glucose blood (TRUETEST TEST) test strip Check Blood Sugar 1-2 Times daily.  Marland Kitchen loratadine (CLARITIN) 10 MG tablet Take 10 mg by mouth daily as needed.   . metFORMIN (GLUCOPHAGE) 1000 MG tablet Take 1 tablet (1,000 mg total) by mouth 2 (two) times daily with a meal.  . omeprazole (PRILOSEC) 40 MG capsule TAKE 1 CAPSULE (40 MG TOTAL) BY MOUTH 2 (TWO) TIMES DAILY BEFORE A MEAL.  . tamsulosin (FLOMAX) 0.4 MG CAPS capsule TAKE 1 CAPSULE (0.4 MG TOTAL) BY MOUTH DAILY AFTER BREAKFAST.   No current facility-administered medications on file prior to visit.    Review of Systems  Constitutional:  Negative for activity change, appetite change, chills, diaphoresis, fatigue and fever.  HENT: Negative for congestion and hearing loss.   Eyes: Negative for visual disturbance.  Respiratory: Negative for cough, chest tightness, shortness of breath and wheezing.   Cardiovascular: Negative for chest pain, palpitations and leg swelling.  Gastrointestinal: Negative for abdominal pain, constipation, diarrhea, nausea and vomiting.  Endocrine: Negative for cold intolerance.  Genitourinary: Negative for dysuria, frequency and hematuria.  Musculoskeletal: Negative for arthralgias and neck pain.  Skin: Negative for rash.  Allergic/Immunologic: Negative for environmental allergies.  Neurological: Negative for dizziness, weakness, light-headedness, numbness and headaches.  Hematological: Negative for adenopathy.  Psychiatric/Behavioral: Negative for behavioral problems, dysphoric mood and sleep disturbance.   Per HPI unless specifically indicated above     Objective:    BP 111/68   Pulse 98   Temp (!) 97.4 F (36.3 C) (Temporal)   Resp 16   Ht 5' 9.5" (1.765 m)   Wt 263 lb (119.3 kg) Comment: as per pt home scale 254 lbs  SpO2 98%   BMI 38.28 kg/m   Wt Readings from Last 3 Encounters:  06/04/20 263 lb (119.3  kg)  05/17/20 262 lb (118.8 kg)  03/04/20 262 lb (118.8 kg)    Physical Exam Vitals and nursing note reviewed.  Constitutional:      General: He is not in acute distress.    Appearance: He is well-developed. He is not diaphoretic.     Comments: Well-appearing, comfortable, cooperative  HENT:     Head: Normocephalic and atraumatic.  Eyes:     General:        Right eye: No discharge.        Left eye: No discharge.     Conjunctiva/sclera: Conjunctivae normal.     Pupils: Pupils are equal, round, and reactive to light.  Neck:     Thyroid: No thyromegaly.  Cardiovascular:     Rate and Rhythm: Normal rate and regular rhythm.     Heart sounds: Normal heart sounds. No murmur heard.   Pulmonary:     Effort: Pulmonary effort is normal. No respiratory distress.     Breath sounds: Normal breath sounds. No wheezing or rales.  Abdominal:     General: Bowel sounds are normal. There is no distension.     Palpations: Abdomen is soft. There is no mass.     Tenderness: There is no abdominal tenderness.  Musculoskeletal:        General: No tenderness.     Cervical back: Normal range of motion and neck supple.     Comments: Right shoulder / arm upper ext in brace immobilizer   Lymphadenopathy:     Cervical: No cervical adenopathy.  Skin:    General: Skin is warm and dry.     Findings: No erythema or rash.  Neurological:     Mental Status: He is alert and oriented to person, place, and time.     Comments: Distal sensation intact to light touch all extremities  Psychiatric:        Behavior: Behavior normal.     Comments: Well groomed, good eye contact, normal speech and thoughts      Results for orders placed or performed in visit on 05/27/20  TSH  Result Value Ref Range   TSH 1.13 0.40 - 4.50 mIU/L  PSA  Result Value Ref Range   PSA 0.85 < OR = 4.0 ng/mL  Lipid panel  Result Value Ref Range   Cholesterol 132 <200 mg/dL  HDL 43 > OR = 40 mg/dL   Triglycerides 216 (H) <150 mg/dL    LDL Cholesterol (Calc) 61 mg/dL (calc)   Total CHOL/HDL Ratio 3.1 <5.0 (calc)   Non-HDL Cholesterol (Calc) 89 <130 mg/dL (calc)  COMPLETE METABOLIC PANEL WITH GFR  Result Value Ref Range   Glucose, Bld 148 (H) 65 - 99 mg/dL   BUN 20 7 - 25 mg/dL   Creat 0.74 0.70 - 1.25 mg/dL   GFR, Est Non African American 100 > OR = 60 mL/min/1.56m2   GFR, Est African American 116 > OR = 60 mL/min/1.34m2   BUN/Creatinine Ratio NOT APPLICABLE 6 - 22 (calc)   Sodium 136 135 - 146 mmol/L   Potassium 4.5 3.5 - 5.3 mmol/L   Chloride 101 98 - 110 mmol/L   CO2 24 20 - 32 mmol/L   Calcium 9.2 8.6 - 10.3 mg/dL   Total Protein 6.8 6.1 - 8.1 g/dL   Albumin 4.2 3.6 - 5.1 g/dL   Globulin 2.6 1.9 - 3.7 g/dL (calc)   AG Ratio 1.6 1.0 - 2.5 (calc)   Total Bilirubin 0.3 0.2 - 1.2 mg/dL   Alkaline phosphatase (APISO) 84 35 - 144 U/L   AST 20 10 - 35 U/L   ALT 28 9 - 46 U/L  CBC with Differential/Platelet  Result Value Ref Range   WBC 6.8 3.8 - 10.8 Thousand/uL   RBC 4.58 4.20 - 5.80 Million/uL   Hemoglobin 12.9 (L) 13.2 - 17.1 g/dL   HCT 39.2 38 - 50 %   MCV 85.6 80.0 - 100.0 fL   MCH 28.2 27.0 - 33.0 pg   MCHC 32.9 32.0 - 36.0 g/dL   RDW 12.7 11.0 - 15.0 %   Platelets 335 140 - 400 Thousand/uL   MPV 10.4 7.5 - 12.5 fL   Neutro Abs 4,556 1,500 - 7,800 cells/uL   Lymphs Abs 1,387 850 - 3,900 cells/uL   Absolute Monocytes 537 200 - 950 cells/uL   Eosinophils Absolute 258 15.0 - 500.0 cells/uL   Basophils Absolute 61 0.0 - 200.0 cells/uL   Neutrophils Relative % 67 %   Total Lymphocyte 20.4 %   Monocytes Relative 7.9 %   Eosinophils Relative 3.8 %   Basophils Relative 0.9 %  Hemoglobin A1c  Result Value Ref Range   Hgb A1c MFr Bld 7.5 (H) <5.7 % of total Hgb   Mean Plasma Glucose 169 (calc)   eAG (mmol/L) 9.3 (calc)      Assessment & Plan:   Problem List Items Addressed This Visit    Type 2 diabetes mellitus with hyperglycemia (HCC)    Stable - A1c 7.5  On GLP1 improved  adherence Complications - other including hyperlipidemia, GERD, obesity - increases risk of future cardiovascular complications   Plan:  1. Continue Bydureon BCise 2mg  weekly inj - re order - Continue Metformin 1000mg  BID Goal w/ wt loss to reduce meds 2. Encourage improved lifestyle - low carb, low sugar diet, reduce portion size, continue improving regular exercise 3. Check CBG , bring log to next visit for review      Relevant Medications   Exenatide ER (BYDUREON BCISE) 2 MG/0.85ML AUIJ   lisinopril (ZESTRIL) 2.5 MG tablet   Morbid obesity (HCC)   Relevant Medications   Exenatide ER (BYDUREON BCISE) 2 MG/0.85ML AUIJ   Major depressive disorder, recurrent, in full remission ([place])    Chronic major depression in remission See PHQ Continue SSRI Fluoxetine      Hyperlipidemia associated  with type 2 diabetes mellitus ([place])    Controlled cholesterol on statin and lifestyle Elevated TG however w/ DM Last lipid panel 05/2020 The 10-year ASCVD risk score Mikey Bussing DC Jr., et al., 2013) is: 9.7%  Plan: 1. Continue current meds - Atorvastatin 10mg  2. Encourage improved lifestyle - low carb/cholesterol, reduce portion size, continue improving regular exercise      Relevant Medications   Exenatide ER (BYDUREON BCISE) 2 MG/0.85ML AUIJ   lisinopril (ZESTRIL) 2.5 MG tablet   BPH with obstruction/lower urinary tract symptoms    BPH Improved on Tamsulosin       Other Visit Diagnoses    Annual physical exam    -  Primary   Controlled type 2 diabetes mellitus without complication, without long-term current use of insulin (HCC)       Relevant Medications   Exenatide ER (BYDUREON BCISE) 2 MG/0.85ML AUIJ   lisinopril (ZESTRIL) 2.5 MG tablet      Updated Health Maintenance information - Declines Flu Shot Reviewed recent lab results with patient Encouraged improvement to lifestyle with diet and exercise - Goal of weight loss   Meds ordered this encounter  Medications  . Exenatide ER  (BYDUREON BCISE) 2 MG/0.85ML AUIJ    Sig: INJECT 2 MG INTO THE SKIN ONCE A WEEK.    Dispense:  10.2 mL    Refill:  3    Keep refills on file  . lisinopril (ZESTRIL) 2.5 MG tablet    Sig: Take 1 tablet (2.5 mg total) by mouth daily.    Dispense:  90 tablet    Refill:  3    Follow up plan: Return in about 6 months (around 12/02/2020) for 6 month follow-up DM A1c, med adjust.  [name], DO [facility] 06/04/2020, 11:24 AM

## 2020-07-19 ENCOUNTER — Other Ambulatory Visit: Payer: Self-pay | Admitting: Family Medicine

## 2020-07-19 DIAGNOSIS — N138 Other obstructive and reflux uropathy: Secondary | ICD-10-CM

## 2020-07-19 DIAGNOSIS — N401 Enlarged prostate with lower urinary tract symptoms: Secondary | ICD-10-CM

## 2020-07-23 ENCOUNTER — Other Ambulatory Visit: Payer: Self-pay | Admitting: Family Medicine

## 2020-07-23 DIAGNOSIS — K219 Gastro-esophageal reflux disease without esophagitis: Secondary | ICD-10-CM

## 2020-10-07 NOTE — MAU Provider Note (Incomplete)
History     CSN: [id number]  Arrival date and time: 10/07/20 2117   Event Date/Time   First Shawn Gordon Initiated Contact with Patient 10/07/20 2240      Chief Complaint  Patient presents with  . vaginal bleeding with clots   60 y.o. G5P5 s/p SVD 4 days ago presenting with passing large clot. Reports onset once while she was at the hospital then once today around 8pm. States clots was size of computer mouse. Reports minimal lochia before and after passing clot. Reports minimal pain. Denies fever but feels chills. Denies malodorous vaginal discharge.  OB History    Gravida  5   Para  5   Term  5   Preterm  0   AB  0   Living  4     SAB  0   IAB  0   Ectopic  0   Multiple  0   Live Births  4           Past Medical History:  Diagnosis Date  . Medical history non-contributory     Past Surgical History:  Procedure Laterality Date  . NO PAST SURGERIES      History reviewed. No pertinent family history.  Social History   Tobacco Use  . Smoking status: Never Smoker  . Smokeless tobacco: Never Used  Vaping Use  . Vaping Use: Never used  Substance Use Topics  . Alcohol use: No  . Drug use: No    Allergies: No Known Allergies  Medications Prior to Admission  Medication Sig Dispense Refill Last Dose  . amLODipine (NORVASC) 5 MG tablet Take 1 tablet (5 mg total) by mouth daily. 30 tablet 0 10/07/2020 at Unknown time  . ibuprofen (ADVIL) 600 MG tablet Take 1 tablet (600 mg total) by mouth every 6 (six) hours. 30 tablet 0 10/07/2020 at Unknown time  . Prenatal Vit-Fe Fumarate-FA (PRENATAL MULTIVITAMIN) TABS tablet Take 1 tablet by mouth daily at 12 noon. 30 tablet 3 10/07/2020 at Unknown time  . senna-docusate (SENOKOT-S) 8.6-50 MG tablet Take 2 tablets by mouth daily. 30 tablet 0 10/07/2020 at Unknown time  . acetaminophen (TYLENOL) 325 MG tablet Take 2 tablets (650 mg total) by mouth every 4 (four) hours as needed (for pain scale < 4). 30 tablet 0      Review of Systems  Constitutional: Positive for chills. Negative for fever.  Gastrointestinal: Positive for abdominal pain (mild).  Genitourinary: Positive for vaginal bleeding.   Physical Exam   Blood pressure 126/86, pulse 100, temperature 98.2 F (36.8 C), temperature source Oral, resp. rate 17, height 5\' 2"  (1.575 m), weight 68.8 kg, SpO2 100 %, unknown if currently breastfeeding.  Physical Exam Vitals and nursing note reviewed. Exam conducted with a chaperone present.  Constitutional:      Appearance: Normal appearance.  HENT:     Head: Normocephalic and atraumatic.  Cardiovascular:     Rate and Rhythm: Normal rate.  Pulmonary:     Effort: Pulmonary effort is normal. No respiratory distress.  Abdominal:     General: There is no distension.     Palpations: Abdomen is soft. There is no mass.     Tenderness: There is no abdominal tenderness. There is no guarding or rebound.     Hernia: No hernia is present.  Genitourinary:    Comments: External: no lesions or erythema Vagina: rugated, pink, moist, minimal lochia rubra  Uterus: + enlarged, anteverted, non tender, no CMT Cervix closed  Musculoskeletal:  General: Normal range of motion.     Cervical back: Normal range of motion.  Skin:    General: Skin is warm and dry.  Neurological:     General: No focal deficit present.     Mental Status: She is alert and oriented to person, place, and time.  Psychiatric:        Mood and Affect: Mood normal.        Behavior: Behavior normal.    No results found for this or any previous visit (from the past 24 hour(s)).  No results found.  MAU Course  Procedures  MDM Labs and [place] ordered and reviewed.   Assessment and Plan  No diagnosis found.   Live interpreter present for encounter  Julianne Handler, CNM 10/07/2020, 10:47 PM

## 2020-11-26 ENCOUNTER — Other Ambulatory Visit: Payer: Self-pay | Admitting: Family Medicine

## 2020-11-26 DIAGNOSIS — K219 Gastro-esophageal reflux disease without esophagitis: Secondary | ICD-10-CM

## 2020-11-26 NOTE — Telephone Encounter (Signed)
Requested Prescriptions  Pending Prescriptions Disp Refills  . omeprazole (PRILOSEC) 40 MG capsule [Pharmacy Med Name: OMEPRAZOLE DR 40 MG CAPSULE] 180 capsule 2    Sig: TAKE 1 CAPSULE (40 MG TOTAL) BY MOUTH 2 (TWO) TIMES DAILY BEFORE A MEAL.     Gastroenterology: Proton Pump Inhibitors Passed - 11/26/2020  6:17 PM      Passed - Valid encounter within last 12 months    Recent Outpatient Visits          5 months ago Annual physical exam   [place], DO   8 months ago Type 2 diabetes mellitus with hyperglycemia, without long-term current use of insulin [facility])   [place], DO   1 year ago Type 2 diabetes mellitus with hyperglycemia, without long-term current use of insulin [facility])   [place], DO   1 year ago Flu-like symptoms   [place], DO   1 year ago Left lateral epicondylitis   [place], DO      Future Appointments            In 1 week [place], Devonne Doughty, [facility], [facility]

## 2020-12-09 ENCOUNTER — Encounter: Payer: Self-pay | Admitting: Family Medicine

## 2020-12-09 ENCOUNTER — Ambulatory Visit (INDEPENDENT_AMBULATORY_CARE_PROVIDER_SITE_OTHER): Payer: Managed Care, Other (non HMO) | Admitting: Family Medicine

## 2020-12-09 ENCOUNTER — Other Ambulatory Visit: Payer: Self-pay | Admitting: Family Medicine

## 2020-12-09 ENCOUNTER — Other Ambulatory Visit: Payer: Self-pay

## 2020-12-09 VITALS — BP 120/68 | HR 84 | Resp 15 | Ht 69.5 in | Wt 260.2 lb

## 2020-12-09 DIAGNOSIS — N401 Enlarged prostate with lower urinary tract symptoms: Secondary | ICD-10-CM

## 2020-12-09 DIAGNOSIS — E1169 Type 2 diabetes mellitus with other specified complication: Secondary | ICD-10-CM

## 2020-12-09 DIAGNOSIS — F3342 Major depressive disorder, recurrent, in full remission: Secondary | ICD-10-CM

## 2020-12-09 DIAGNOSIS — K219 Gastro-esophageal reflux disease without esophagitis: Secondary | ICD-10-CM

## 2020-12-09 DIAGNOSIS — E785 Hyperlipidemia, unspecified: Secondary | ICD-10-CM

## 2020-12-09 DIAGNOSIS — N138 Other obstructive and reflux uropathy: Secondary | ICD-10-CM

## 2020-12-09 DIAGNOSIS — Z0000 Encounter for general adult medical examination without abnormal findings: Secondary | ICD-10-CM

## 2020-12-09 LAB — POCT GLYCOSYLATED HEMOGLOBIN (HGB A1C): Hemoglobin A1C: 7.3 % — AB (ref 4.0–5.6)

## 2020-12-09 NOTE — Assessment & Plan Note (Signed)
BMI >37 with risk factors comorbid type 2 diabetes, and hyperlipidemia, meet criteria for morbid obesity dx  Encourage wt loss and lifestyle, on GLP1 

## 2020-12-09 NOTE — Patient Instructions (Addendum)
Thank you for coming to the office today.  Recent Labs    03/04/20 0912 05/28/20 0831 12/09/20 0823  HGBA1C 7.5* 7.5* 7.3*   Continue current therapy. No changes  Recommend OTC Hydrocortisone cream twice a day as needed for itching. Let me know if you need anything else.  DUE for FASTING BLOOD WORK (no food or drink after midnight before the lab appointment, only water or coffee without cream/sugar on the morning of)  SCHEDULE "Lab Only" visit in the morning at the clinic for lab draw in 6 MONTHS   - Make sure Lab Only appointment is at about 1 week before your next appointment, so that results will be available  For Lab Results, once available within 2-3 days of blood draw, you can can log in to MyChart online to view your results and a brief explanation. Also, we can discuss results at next follow-up visit.   Please schedule a Follow-up Appointment to: Return in about 6 months (around 06/11/2021) for 6 month fasting lab only then 1 week later Annual Physical.  If you have any other questions or concerns, please feel free to call the office or send a message through [place]. You may also schedule an earlier appointment if necessary.  Additionally, you may be receiving a survey about your experience at our office within a few days to 1 week by e-mail or mail. We value your feedback.  [name], DO [place]

## 2020-12-09 NOTE — Assessment & Plan Note (Signed)
Stable - A1c 7.3 improved On GLP1 improved adherence Complications - other including hyperlipidemia, GERD, obesity - increases risk of future cardiovascular complications   Plan:  1. Continue Bydureon BCise 2mg  weekly inj - Continue Metformin 1000mg  BID Goal w/ wt loss to reduce meds 2. Encourage improved lifestyle - low carb, low sugar diet, reduce portion size, continue improving regular exercise 3. Check CBG , bring log to next visit for review

## 2020-12-09 NOTE — Assessment & Plan Note (Signed)
Chronic major depression in remission See PHQ Continue SSRI Fluoxetine

## 2020-12-09 NOTE — Assessment & Plan Note (Signed)
Stable chronic GERD controlled on high dose BID PPI No known complication No prior GI and EGD  Plan: 1. Discussed chronic management of GERD and PPI, also long-term potential complications/side effects. 2. Continue Omeprazole 40mg  BID

## 2020-12-09 NOTE — Progress Notes (Signed)
Subjective:    Patient ID: Shawn Gordon, male    DOB: 1961/06/11, 59 y.o.   MRN: [id number]  Shawn Gordon is a 60 y.o. male presenting on 12/09/2020 for Diabetes (6 month follow up)   HPI   CHRONIC DM, Type 2, Morbid Obesity Last A1c 7.5 range. Today A1c due CBGs:Rare low sugar hypoglycemia only due to not eating and still working outside triggered, had some hyperglycemia Meds:Metformin 1000mg  BID, Bydureon BCise 2mg  weekly inj Reports good compliance. Tolerating well w/o side-effects Currently on ACEi Lifestyle: He had episode of high sugar craving recently after some low sugars Walking more Admits R toe tingling if in wrong position - has improved some not having other areas of numbness tingling or pain. Present there for few years. Does not affect him walking and gait. Denies hypoglycemia, polyuria, visual changes, numbness.  Major Depression, recurrent chronic, in full remission Chronic problem See PHQ On Fluoxetine 40mg  daily  GERD On PPI Omeprazole 40mg  BID  BPH Doing well without concern. On Flomax 0.4mg  daily.    Depression screen [facility] 2/9 12/09/2020 06/04/2020 03/04/2020  Decreased Interest 1 0 3  Down, Depressed, Hopeless 0 0 2  PHQ - 2 Score 1 0 5  Altered sleeping 0 0 0  Tired, decreased energy 0 1 3  Change in appetite 1 0 3  Feeling bad or failure about yourself  0 0 0  Trouble concentrating 1 0 1  Moving slowly or fidgety/restless 0 0 0  Suicidal thoughts 0 0 0  PHQ-9 Score 3 1 12   Difficult doing work/chores Not difficult at all Not difficult at all Very difficult   GAD 7 : Generalized Anxiety Score 12/09/2020 06/04/2020 03/04/2020  Nervous, Anxious, on Edge 0 0 2  Control/stop worrying 0 0 1  Worry too much - different things 1 0 2  Trouble relaxing 0 0 1  Restless 1 0 1  Easily annoyed or irritable 1 0 1  Afraid - awful might happen 0 0 0  Total GAD 7 Score 3 0 8  Anxiety Difficulty Not difficult at all Not difficult at all Somewhat  difficult      Social History   Tobacco Use  . Smoking status: Former Smoker    Packs/day: 2.00    Types: Cigarettes    Start date: 04/19/1978    Quit date: 02/26/2001    Years since quitting: 19.7  . Smokeless tobacco: Former Network engineer  . Vaping Use: Never used  Substance Use Topics  . Alcohol use: No    Alcohol/week: 0.0 standard drinks  . Drug use: No    Review of Systems Per HPI unless specifically indicated above     Objective:    BP 120/68 (BP Location: Left Arm, Patient Position: Sitting, Cuff Size: Large)   Pulse 84   Resp 15   Ht 5' 9.5" (1.765 m)   Wt 260 lb 3.2 oz (118 kg)   SpO2 99%   BMI 37.87 kg/m   Wt Readings from Last 3 Encounters:  12/09/20 260 lb 3.2 oz (118 kg)  06/04/20 263 lb (119.3 kg)  05/17/20 262 lb (118.8 kg)    Physical Exam Vitals and nursing note reviewed.  Constitutional:      General: He is not in acute distress.    Appearance: He is well-developed. He is not diaphoretic.     Comments: Well-appearing, comfortable, cooperative  HENT:     Head: Normocephalic and atraumatic.  Eyes:  General:        Right eye: No discharge.        Left eye: No discharge.     Conjunctiva/sclera: Conjunctivae normal.  Neck:     Thyroid: No thyromegaly.  Cardiovascular:     Rate and Rhythm: Normal rate and regular rhythm.     Heart sounds: Normal heart sounds. No murmur heard.   Pulmonary:     Effort: Pulmonary effort is normal. No respiratory distress.     Breath sounds: Normal breath sounds. No wheezing or rales.  Musculoskeletal:        General: Normal range of motion.     Cervical back: Normal range of motion and neck supple.  Lymphadenopathy:     Cervical: No cervical adenopathy.  Skin:    General: Skin is warm and dry.     Findings: No erythema or rash.     Comments: Small raised SK lesion L forearm.  Neurological:     Mental Status: He is alert and oriented to person, place, and time.  Psychiatric:        Behavior:  Behavior normal.     Comments: Well groomed, good eye contact, normal speech and thoughts      Recent Labs    03/04/20 0912 05/28/20 0831 12/09/20 0823  HGBA1C 7.5* 7.5* 7.3*    Results for orders placed or performed in visit on 12/09/20  POCT HgB A1C  Result Value Ref Range   Hemoglobin A1C 7.3 (A) 4.0 - 5.6 %      Assessment & Plan:   Problem List Items Addressed This Visit    Type 2 diabetes mellitus with other specified complication ([place]) - Primary    Stable - A1c 7.3 improved On GLP1 improved adherence Complications - other including hyperlipidemia, GERD, obesity - increases risk of future cardiovascular complications   Plan:  1. Continue Bydureon BCise 2mg  weekly inj - Continue Metformin 1000mg  BID Goal w/ wt loss to reduce meds 2. Encourage improved lifestyle - low carb, low sugar diet, reduce portion size, continue improving regular exercise 3. Check CBG , bring log to next visit for review      Relevant Orders   POCT HgB A1C (Completed)   Morbid obesity ([place])    BMI >37 with risk factors comorbid type 2 diabetes, and hyperlipidemia, meet criteria for morbid obesity dx  Encourage wt loss and lifestyle, on GLP1      Major depressive disorder, recurrent, in full remission ([place])    Chronic major depression in remission See PHQ Continue SSRI Fluoxetine      GERD (gastroesophageal reflux disease)    Stable chronic GERD controlled on high dose BID PPI No known complication No prior GI and EGD  Plan: 1. Discussed chronic management of GERD and PPI, also long-term potential complications/side effects. 2. Continue Omeprazole 40mg  BID         No orders of the defined types were placed in this encounter.    Follow up plan: Return in about 6 months (around 06/11/2021) for 6 month fasting lab only then 1 week later Annual Physical.  Future labs ordered for 05/2021   Shawn Gordon, [place]  [facility] 12/09/2020, 8:19 AM

## 2021-02-20 ENCOUNTER — Ambulatory Visit: Payer: Self-pay | Admitting: *Deleted

## 2021-02-20 ENCOUNTER — Other Ambulatory Visit: Payer: Self-pay | Admitting: Family Medicine

## 2021-02-20 DIAGNOSIS — E1165 Type 2 diabetes mellitus with hyperglycemia: Secondary | ICD-10-CM

## 2021-02-20 DIAGNOSIS — F331 Major depressive disorder, recurrent, moderate: Secondary | ICD-10-CM

## 2021-02-20 NOTE — Telephone Encounter (Signed)
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 Drinks from 64-120 oz water during his shift plus sweating and voiding several times. Only eating twice during the 8 hours.Hx DM Type 2 Care Advice: reviewed low blood sugar symptoms, how to prevent a crash and how to treat low blood sugar if occurs again including additional snacks between meals and keeping glucose tabs/get on hand for emergencies. He will also test blood sugar if/when occurs again. Reviewed heat exhaustion symptoms and ways to prevent it.  Shawn Gordon agrees to the plan and to call back if these interventions do not eliminate the episodes at work.    Reason for Disposition  [1] Blood glucose < 70 mg/dL (3.9 mmol/L) or symptomatic AND [2] cause known  Answer Assessment - Initial Assessment Questions 1. DESCRIPTION: "Describe your dizziness."      2. LIGHTHEADED: "Do you feel lightheaded?" (e.g., somewhat faint, woozy, weak upon standing)     Standing up 3. VERTIGO: "Do you feel like either you or the room is spinning or tilting?" (i.e. vertigo)     possibly 4. SEVERITY: "How bad is it?"  "Do you feel like you are going to faint?" "Can you stand and walk?"   - MILD: Feels slightly dizzy, but walking normally.   - MODERATE: Feels unsteady when walking, but not falling; interferes with normal activities (e.g., school, work).   - SEVERE: Unable to walk without falling, or requires assistance to walk without falling; feels like passing out now.      mild 5. ONSET:  "When did the dizziness begin?"     Monday 6. AGGRAVATING FACTORS: "Does anything make it worse?" (e.g., standing, change in head position)     standing 7. HEART RATE: "Can you tell me your heart rate?" "How many beats in 15 seconds?"  (Note: not all patients can do this)       no 8. CAUSE: "What do you think is causing the dizziness?"     Possibly heat 9.  RECURRENT SYMPTOM: "Have you had dizziness before?" If Yes, ask: "When was the last time?" "What happened that time?"     no 10. OTHER SYMPTOMS: "Do you have any other symptoms?" (e.g., fever, chest pain, vomiting, diarrhea, bleeding)       vomiting 11. PREGNANCY: "Is there any chance you are pregnant?" "When was your last menstrual period?"       na  Protocols used: Dizziness - Lightheadedness-A-AH, Diabetes - Low Blood Sugar-A-AH

## 2021-02-20 NOTE — Telephone Encounter (Signed)
Future OV 06/10/21. Approved per protocol.  Requested Prescriptions  Pending Prescriptions Disp Refills  . metFORMIN (GLUCOPHAGE) 1000 MG tablet [Pharmacy Med Name: METFORMIN HCL 1,000 MG TABLET] 180 tablet 1    Sig: TAKE 1 TABLET (1,000 MG TOTAL) BY MOUTH 2 (TWO) TIMES DAILY WITH A MEAL.     Endocrinology:  Diabetes - Biguanides Passed - 02/20/2021  9:25 AM      Passed - Cr in normal range and within 360 days    Creat  Date Value Ref Range Status  05/28/2020 0.74 0.70 - 1.25 mg/dL Final    Comment:    For patients >93 years of age, the reference limit for Creatinine is approximately 13% higher for people identified as African-American. .          Passed - HBA1C is between 0 and 7.9 and within 180 days    Hemoglobin A1C  Date Value Ref Range Status  12/09/2020 7.3 (A) 4.0 - 5.6 % Final   Hgb A1c MFr Bld  Date Value Ref Range Status  05/28/2020 7.5 (H) <5.7 % of total Hgb Final    Comment:    For someone without known diabetes, a hemoglobin A1c value of 6.5% or greater indicates that they may have  diabetes and this should be confirmed with a follow-up  test. . For someone with known diabetes, a value <7% indicates  that their diabetes is well controlled and a value  greater than or equal to 7% indicates suboptimal  control. A1c targets should be individualized based on  duration of diabetes, age, comorbid conditions, and  other considerations. . Currently, no consensus exists regarding use of hemoglobin A1c for diagnosis of diabetes for children. .          Passed - eGFR in normal range and within 360 days    GFR, Est African American  Date Value Ref Range Status  05/28/2020 116 > OR = 60 mL/min/1.46m Final   GFR, Est Non African American  Date Value Ref Range Status  05/28/2020 100 > OR = 60 mL/min/1.776mFinal         Passed - Valid encounter within last 6 months    Recent Outpatient Visits          2 months ago Type 2 diabetes mellitus with other  specified complication, without long-term current use of insulin ([facility]   8 months ago [facility]   [facility]   11 months ago Type 2 diabetes mellitus with hyperglycemia, without long-term current use of insulin [facility]   1 year ago Type 2 diabetes mellitus with hyperglycemia, without long-term current use of insulin ([facility]  [facility]   1 year ago Flu-like symptoms   [place]      Future Appointments            In 3 months [facility] [facility]         . FLUoxetine (PROZAC) 40 MG capsule [Pharmacy Med Name: FLUOXETINE HCL 40 MG CAPSULE] 90 capsule 3    Sig: TAKE 1 CAPSULE BY MOUTH EVERY DAY     Psychiatry:  Antidepressants - SSRI Passed - 02/20/2021  9:25 AM      Passed - Completed PHQ-2 or  PHQ-9 in the last 360 days      Passed - Valid encounter within last 6 months    Recent Outpatient Visits          2 months ago Type 2 diabetes mellitus with other specified complication, without long-term current use of insulin [facility])   [facility], Devonne Doughty, DO   8 months ago Annual physical exam   [facility] Olin Hauser, DO   11 months ago Type 2 diabetes mellitus with hyperglycemia, without long-term current use of insulin [facility])   [facility] Olin Hauser, DO   1 year ago Type 2 diabetes mellitus with hyperglycemia, without long-term current use of insulin [facility])   [facility] Olin Hauser, DO   1 year ago Flu-like symptoms   [place], DO      Future Appointments            In 3 months [place], Devonne Doughty, DO [facility], [facility]

## 2021-02-21 ENCOUNTER — Other Ambulatory Visit: Payer: Self-pay

## 2021-02-21 DIAGNOSIS — E1169 Type 2 diabetes mellitus with other specified complication: Secondary | ICD-10-CM

## 2021-02-21 DIAGNOSIS — E785 Hyperlipidemia, unspecified: Secondary | ICD-10-CM

## 2021-02-21 MED ORDER — ATORVASTATIN CALCIUM 10 MG PO TABS: ORAL_TABLET | ORAL | 1 refills | Status: DC

## 2021-03-13 ENCOUNTER — Other Ambulatory Visit: Payer: Self-pay

## 2021-03-13 ENCOUNTER — Encounter: Payer: Self-pay | Admitting: Internal Medicine

## 2021-03-13 ENCOUNTER — Ambulatory Visit
Admission: RE | Admit: 2021-03-13 | Discharge: 2021-03-13 | Disposition: A | Discharge status: Home / Self Care | Payer: Managed Care, Other (non HMO) | Source: Home / Self Care | Attending: Internal Medicine | Admitting: Internal Medicine

## 2021-03-13 ENCOUNTER — Ambulatory Visit (INDEPENDENT_AMBULATORY_CARE_PROVIDER_SITE_OTHER): Payer: Managed Care, Other (non HMO) | Admitting: Internal Medicine

## 2021-03-13 ENCOUNTER — Ambulatory Visit
Admission: RE | Admit: 2021-03-13 | Discharge: 2021-03-13 | Disposition: A | Discharge status: Home / Self Care | Payer: Managed Care, Other (non HMO) | Source: Ambulatory Visit | Attending: Internal Medicine | Admitting: Internal Medicine

## 2021-03-13 VITALS — BP 125/69 | HR 95 | Temp 97.7°F | Resp 17 | Ht 69.5 in | Wt 259.8 lb

## 2021-03-13 DIAGNOSIS — M545 Low back pain, unspecified: Secondary | ICD-10-CM | POA: Insufficient documentation

## 2021-03-13 DIAGNOSIS — E1169 Type 2 diabetes mellitus with other specified complication: Secondary | ICD-10-CM

## 2021-03-13 DIAGNOSIS — G8929 Other chronic pain: Secondary | ICD-10-CM

## 2021-03-13 DIAGNOSIS — R61 Generalized hyperhidrosis: Secondary | ICD-10-CM

## 2021-03-13 DIAGNOSIS — R6889 Other general symptoms and signs: Secondary | ICD-10-CM | POA: Diagnosis not present

## 2021-03-13 DIAGNOSIS — R112 Nausea with vomiting, unspecified: Secondary | ICD-10-CM | POA: Diagnosis not present

## 2021-03-13 DIAGNOSIS — R5383 Other fatigue: Secondary | ICD-10-CM

## 2021-03-13 NOTE — Progress Notes (Signed)
Subjective:    Patient ID: Shawn Gordon, male    DOB: 1961/06/22, 60 y.o.   MRN: [id number]  HPI  Patient presents to the clinic today with complaint of fatigue, sweating, nausea and vomiting. He reports this started a few months ago. It typically occurs in the early afternoon. He reports intermittent dizziness, but denies visual changes, headaches, chest pain or shortness of breath. He reports symptoms improve with resting in the air condition. He has a history of DM 2, but does not check his sugars during the episodes. He reports his sugars have been running high. He is eating at routine intervals. He is drinking adequate amounts of water daily.  He also reports chronic low back pain.  He reports this started years ago.  He describes the pain as stiffness.  The pain does not radiate.  The pain is worse with standing for long period of time but improves with walking, sitting or laying down.  He denies any issues with bowel or bladder.  He denies any injury to the area.  He has not taken anything OTC for this.  Review of Systems     Past Medical History:  Diagnosis Date   Arthritis    Lower spine, wrist, hips(pre-replacement), shoulders   Asthma    Back pain    Diabetes mellitus, type 2 (HCC)    GERD (gastroesophageal reflux disease)    Presence of dental prosthetic device    2 implants.  1 top, 1 bottom    Current Outpatient Medications  Medication Sig Dispense Refill   acetaminophen (TYLENOL) 500 MG tablet Take 2 tablets (1,000 mg total) by mouth every 8 (eight) hours. 90 tablet 2   atorvastatin (LIPITOR) 10 MG tablet TAKE 1 TABLET BY MOUTH EVERYDAY AT BEDTIME 90 tablet 1   B Complex Vitamins (B COMPLEX PO) Take by mouth daily.     Cholecalciferol (VITAMIN D3 PO) Take by mouth daily.     Exenatide ER (BYDUREON BCISE) 2 MG/0.85ML AUIJ INJECT 2 MG INTO THE SKIN ONCE A WEEK. 10.2 mL 3   FLUoxetine (PROZAC) 40 MG capsule TAKE 1 CAPSULE BY MOUTH EVERY DAY 90 capsule 1   glucose  blood (TRUETEST TEST) test strip Check Blood Sugar 1-2 Times daily. 100 each 12   lisinopril (ZESTRIL) 2.5 MG tablet Take 1 tablet (2.5 mg total) by mouth daily. 90 tablet 3   loratadine (CLARITIN) 10 MG tablet Take 10 mg by mouth daily as needed.      metFORMIN (GLUCOPHAGE) 1000 MG tablet TAKE 1 TABLET (1,000 MG TOTAL) BY MOUTH 2 (TWO) TIMES DAILY WITH A MEAL. 180 tablet 1   omeprazole (PRILOSEC) 40 MG capsule TAKE 1 CAPSULE (40 MG TOTAL) BY MOUTH 2 (TWO) TIMES DAILY BEFORE A MEAL. 180 capsule 2   tamsulosin (FLOMAX) 0.4 MG CAPS capsule TAKE 1 CAPSULE (0.4 MG TOTAL) BY MOUTH DAILY AFTER BREAKFAST. 90 capsule 3   No current facility-administered medications for this visit.    No Known Allergies  Family History  Problem Relation Age of Onset   Stroke Mother    Stroke Father    Cancer Father    Hypertension Paternal Grandfather     Social History   Socioeconomic History   Marital status: Single    Spouse name: Not on file   Number of children: Not on file   Years of education: Not on file   Highest education level: Not on file  Occupational History   Occupation: Product/process development scientist  Comment: Night-shift  Tobacco Use   Smoking status: Former    Packs/day: 2.00    Types: Cigarettes    Start date: 04/19/1978    Quit date: 02/26/2001    Years since quitting: 20.0   Smokeless tobacco: Former  Scientific laboratory technician Use: Never used  Substance and Sexual Activity   Alcohol use: No    Alcohol/week: 0.0 standard drinks   Drug use: No   Sexual activity: Not on file  Other Topics Concern   Not on file  Social History Narrative   Not on file   Social Determinants of Health   Financial Resource Strain: Not on file  Food Insecurity: Not on file  Transportation Needs: Not on file  Physical Activity: Not on file  Stress: Not on file  Social Connections: Not on file  Intimate Partner Violence: Not on file     Constitutional: Patient reports fatigue and heat intolerance.   Denies fever, malaise, headache or abrupt weight changes.  Respiratory: Denies difficulty breathing, shortness of breath, cough or sputum production.   Cardiovascular: Denies chest pain, chest tightness, palpitations or swelling in the hands or feet.  Gastrointestinal: Patient reports nausea and vomiting.  Denies abdominal pain, bloating, constipation, diarrhea or blood in the stool.  GU: Denies urgency, frequency, pain with urination, burning sensation, blood in urine, odor or discharge. Musculoskeletal: Patient reports chronic low back pain.  Denies decrease in range of motion, difficulty with gait, muscle pain or joint swelling.  Skin: Patient reports excessive sweating.  Denies redness, rashes, lesions or ulcercations.  Neurological: Denies dizziness, difficulty with memory, difficulty with speech or problems with balance and coordination.    No other specific complaints in a complete review of systems (except as listed in HPI above).  Objective:   Physical Exam  BP 125/69 (BP Location: Right Arm, Patient Position: Sitting, Cuff Size: Large)   Pulse 95   Temp 97.7 F (36.5 C) (Temporal)   Resp 17   Ht 5' 9.5" (1.765 m)   Wt 259 lb 12.8 oz (117.8 kg)   SpO2 98%   BMI 37.82 kg/m   Wt Readings from Last 3 Encounters:  12/09/20 260 lb 3.2 oz (118 kg)  06/04/20 263 lb (119.3 kg)  05/17/20 262 lb (118.8 kg)    General: Appears his stated age, obese, in NAD. Skin: Warm, dry and intact. No ulcerations noted. Cardiovascular: Normal rate and rhythm. S1,S2 noted.  No murmur, rubs or gallops noted. No JVD or BLE edema. No carotid bruits noted. Pulmonary/Chest: Normal effort and positive vesicular breath sounds. No respiratory distress. No wheezes, rales or ronchi noted.  Abdomen: Normal bowel sounds.  Musculoskeletal: Bony tenderness noted over the lumbar spine.  No difficulty with gait.  Neurological: Alert and oriented.  BMET    Component Value Date/Time   NA 136 05/28/2020 0831    K 4.5 05/28/2020 0831   CL 101 05/28/2020 0831   CO2 24 05/28/2020 0831   GLUCOSE 148 (H) 05/28/2020 0831   BUN 20 05/28/2020 0831   CREATININE 0.74 05/28/2020 0831   CALCIUM 9.2 05/28/2020 0831   GFRNONAA 100 05/28/2020 0831   GFRAA 116 05/28/2020 0831    Lipid Panel     Component Value Date/Time   CHOL 132 05/28/2020 0831   TRIG 216 (H) 05/28/2020 0831   HDL 43 05/28/2020 0831   CHOLHDL 3.1 05/28/2020 0831   VLDL 31 (H) 01/14/2016 1647   LDLCALC 61 05/28/2020 0831    CBC  Component Value Date/Time   WBC 6.8 05/28/2020 0831   RBC 4.58 05/28/2020 0831   HGB 12.9 (L) 05/28/2020 0831   HCT 39.2 05/28/2020 0831   PLT 335 05/28/2020 0831   MCV 85.6 05/28/2020 0831   MCH 28.2 05/28/2020 0831   MCHC 32.9 05/28/2020 0831   RDW 12.7 05/28/2020 0831   LYMPHSABS 1,387 05/28/2020 0831   MONOABS 497 01/14/2016 1647   EOSABS 258 05/28/2020 0831   BASOSABS 61 05/28/2020 0831    Hgb A1C Lab Results  Component Value Date   HGBA1C 7.3 (A) 12/09/2020           Assessment & Plan:   Fatigue, Sweating, Nausea, Vomiting likely secondary to Heat Intolerance, History of DM2, Obesity:  Will check B met, TSH and A1c Advised him to take his meter to work and check his blood sugar during these episodes Encouraged continued water intake His weight and deconditioning could be a contributing factor  Chronic Low Back Pain:  Will obtain x-ray of lumbar spine He reports he was told he had arthritis in the past Encourage regular stretching and core strengthening Okay to take Tylenol OTC as needed Encourage weight loss as this can help reduce back pain  Webb Silversmith, NP This visit occurred during the SARS-CoV-2 public health emergency.  Safety protocols were in place, including screening questions prior to the visit, additional usage of staff PPE, and extensive cleaning of exam room while observing appropriate contact time as indicated for disinfecting solutions.

## 2021-03-13 NOTE — Patient Instructions (Signed)
Heat Exhaustion Heat exhaustion happens when the body gets overheated from hot weather, exercise, strenuous physical activity, dehydration, or a combination of these. It is important to treat heat exhaustion as soon as possible. If untreated, heat exhaustion can lead to heat stroke, which is a medical emergency and canbe life-threatening. What are the causes? Common causes of heat exhaustion include: Exercising or doing strenuous labor or activity in hot or humid weather. Being exposed to very warm and poorly ventilated environments, such as living in a home without air conditioning or being in a car without good ventilation. Not drinking enough water, especially in hot or humid weather. Not having enough salts and minerals in the blood (electrolytes). What increases the risk? The following factors may make you more likely to develop this condition: Exercising beyond your fitness level in hot conditions. Exercising or working in hot weather when your body is not used to the heat. Wearing clothing that does not allow your sweat to evaporate. Drinking a lot of alcoholic beverages or beverages that have caffeine. This can lead to dehydration. Being age 57 or older. Being a child. Having certain chronic medical conditions, such as heart disease, poor circulation or other vascular diseases, sickle cell disease, high blood pressure, diabetes, lung disease, or cystic fibrosis. Being very overweight (obese). Taking certain medicines, such as those for high blood pressure, antihistamines, or tranquilizers. Using drugs such as cocaine, heroin, or amphetamines. What are the signs or symptoms? Symptoms of this condition include: Heavy sweating along with feeling weak, dizzy, light-headed, and nauseous. Vomiting. Red, blotchy rash over the body. This is also called prickly heat. Rapid heartbeat. Headache. Body temperature over 102.49F (39C). Urine that is darker than normal. Muscle cramps, such as  in the leg or side (flank). Moist, cool, and clammy skin. Fatigue. Thirst. Difficulty focusing or concentrating. Passing out. Signs and symptoms may develop suddenly or over time. How is this diagnosed? This condition may be diagnosed based on your symptoms, a physical exam, andhistory of heat exposure. How is this treated? This condition may be treated by: Immediately stopping physical activity. Moving to a cooler, shaded area with good ventilation and airflow, or to an air-conditioned environment. Removing all unnecessary clothing to expose as much skin to the air as possible. Using cooling fans and water-misting sprays to cool the body down. Drinking plenty of fluids, including sports drinks with electrolytes. Having cooling blankets placed on you to lower your body temperature. Giving you fluids through an IV in a hospital. Follow these instructions at home: If you think that you have heat exhaustion: Ask a friend or a family member to stay with you. Take a cool bath or shower. Drink enough fluid to keep your urine pale yellow. Lie down and rest. Return to your normal activities as told by your health care provider. Ask your health care provider what activities are safe for you. Contact a health care provider if: Symptoms last for more than 30 minutes. You feel your symptoms are not improving after taking steps to cool down. Get help right away if: You have any symptoms of heat stroke. These include: Temperature of 104F (40C) or higher. Diffusely red skin. Inability to sweat, resulting in hot, dry skin. Excessive thirst. Nausea and vomiting. Rapid breathing. Chest pain. Headache. Confusion or disorientation. Fainting. Seizure. These symptoms may represent a serious problem that is an emergency. Do not wait to see if the symptoms will go away. Get medical help right away. Call your local emergency services (911  in the [place] Do not drive yourself to the  hospital. Summary Heat exhaustion happens when your body gets overheated from hot weather, strenuous activity, and dehydration. Symptoms include sweating, fatigue, muscle cramps, and headache. Treat heat exhaustion immediately by moving to a ventilated and cool environment, having cool water sprayed on as much of the skin as possible, removing all unnecessary clothing, and drinking fluids with electrolytes. If your symptoms last for more than 30 minutes, contact a health care provider. If untreated, heat exhaustion can lead to heat stroke, which is a medical emergency and can be life-threatening. This information is not intended to replace advice given to you by your health care provider. Make sure you discuss any questions you have with your healthcare provider. Document Revised: 01/01/2020 Document Reviewed: 01/01/2020 Elsevier Patient Education  [place].

## 2021-03-14 LAB — BASIC METABOLIC PANEL
BUN: 15 mg/dL (ref 7–25)
CO2: 25 mmol/L (ref 20–32)
Calcium: 10.2 mg/dL (ref 8.6–10.3)
Chloride: 99 mmol/L (ref 98–110)
Creat: 0.8 mg/dL (ref 0.70–1.35)
Glucose, Bld: 207 mg/dL — ABNORMAL HIGH (ref 65–139)
Potassium: 4.6 mmol/L (ref 3.5–5.3)
Sodium: 135 mmol/L (ref 135–146)

## 2021-03-14 LAB — HEMOGLOBIN A1C
Hgb A1c MFr Bld: 8 % of total Hgb — ABNORMAL HIGH (ref <5.7)
Mean Plasma Glucose: 183 mg/dL
eAG (mmol/L): 10.1 mmol/L

## 2021-03-14 LAB — TSH: TSH: 1.54 mIU/L (ref 0.40–4.50)

## 2021-03-14 MED ORDER — RYBELSUS 7 MG PO TABS: 7.0000 mg | ORAL_TABLET | Freq: Every day | ORAL | 2 refills | Status: DC

## 2021-03-14 MED ORDER — RYBELSUS 3 MG PO TABS: 1.0000 | ORAL_TABLET | Freq: Every day | ORAL | 0 refills | Status: DC

## 2021-03-14 NOTE — Addendum Note (Signed)
Addended by: Jearld Fenton on: 03/14/2021 03:11 PM   Modules accepted: Orders

## 2021-05-23 ENCOUNTER — Other Ambulatory Visit: Payer: Self-pay | Admitting: Family Medicine

## 2021-05-23 DIAGNOSIS — N401 Enlarged prostate with lower urinary tract symptoms: Secondary | ICD-10-CM

## 2021-05-23 DIAGNOSIS — N138 Other obstructive and reflux uropathy: Secondary | ICD-10-CM

## 2021-05-23 NOTE — Telephone Encounter (Signed)
Requested Prescriptions  Pending Prescriptions Disp Refills  . tamsulosin (FLOMAX) 0.4 MG CAPS capsule [Pharmacy Med Name: TAMSULOSIN HCL 0.4 MG CAPSULE] 90 capsule 0    Sig: TAKE 1 CAPSULE (0.4 MG TOTAL) BY MOUTH DAILY AFTER BREAKFAST.     Urology: Alpha-Adrenergic Blocker Passed - 05/23/2021  1:41 AM      Passed - Last BP in normal range    BP Readings from Last 1 Encounters:  03/13/21 125/69         Passed - Valid encounter within last 12 months    Recent Outpatient Visits          2 months ago Heat intolerance   [facility] [place], Coralie Keens, NP   5 months ago Type 2 diabetes mellitus with other specified complication, without long-term current use of insulin [facility])   [facility] Olin Hauser, DO   11 months ago Annual physical exam   [facility] Olin Hauser, DO   1 year ago Type 2 diabetes mellitus with hyperglycemia, without long-term current use of insulin [facility])   [facility], Devonne Doughty, DO   2 years ago Type 2 diabetes mellitus with hyperglycemia, without long-term current use of insulin ([place])   [facility] [place], Devonne Doughty, DO      Future Appointments            In 3 weeks [place], Devonne Doughty, DO [facility], [facility]

## 2021-05-29 ENCOUNTER — Other Ambulatory Visit: Payer: Self-pay | Admitting: Family Medicine

## 2021-05-29 DIAGNOSIS — E119 Type 2 diabetes mellitus without complications: Secondary | ICD-10-CM

## 2021-05-29 NOTE — Telephone Encounter (Signed)
Requested Prescriptions  Pending Prescriptions Disp Refills  . lisinopril (ZESTRIL) 2.5 MG tablet [Pharmacy Med Name: LISINOPRIL 2.5 MG TABLET] 90 tablet 0    Sig: TAKE 1 TABLET BY MOUTH EVERY DAY     Cardiovascular:  ACE Inhibitors Passed - 05/29/2021  4:02 AM      Passed - Cr in normal range and within 180 days    Creat  Date Value Ref Range Status  03/13/2021 0.80 0.70 - 1.35 mg/dL Final         Passed - K in normal range and within 180 days    Potassium  Date Value Ref Range Status  03/13/2021 4.6 3.5 - 5.3 mmol/L Final         Passed - Patient is not pregnant      Passed - Last BP in normal range    BP Readings from Last 1 Encounters:  03/13/21 125/69         Passed - Valid encounter within last 6 months    Recent Outpatient Visits          2 months ago Heat intolerance   [facility] [place], Coralie Keens, NP   5 months ago Type 2 diabetes mellitus with other specified complication, without long-term current use of insulin [facility])   [facility] Olin Hauser, DO   11 months ago Annual physical exam   [place], Devonne Doughty, DO   1 year ago Type 2 diabetes mellitus with hyperglycemia, without long-term current use of insulin [facility])   [facility], Devonne Doughty, DO   2 years ago Type 2 diabetes mellitus with hyperglycemia, without long-term current use of insulin ([place])   [facility] [place], Devonne Doughty, DO      Future Appointments            In 2 weeks [place], Devonne Doughty, DO [facility], [facility]

## 2021-06-09 ENCOUNTER — Other Ambulatory Visit: Payer: Self-pay

## 2021-06-09 DIAGNOSIS — Z0000 Encounter for general adult medical examination without abnormal findings: Secondary | ICD-10-CM

## 2021-06-09 DIAGNOSIS — N401 Enlarged prostate with lower urinary tract symptoms: Secondary | ICD-10-CM

## 2021-06-09 DIAGNOSIS — F3342 Major depressive disorder, recurrent, in full remission: Secondary | ICD-10-CM

## 2021-06-09 DIAGNOSIS — N138 Other obstructive and reflux uropathy: Secondary | ICD-10-CM

## 2021-06-09 DIAGNOSIS — E1169 Type 2 diabetes mellitus with other specified complication: Secondary | ICD-10-CM

## 2021-06-10 ENCOUNTER — Other Ambulatory Visit: Payer: Managed Care, Other (non HMO)

## 2021-06-10 ENCOUNTER — Other Ambulatory Visit: Payer: Self-pay

## 2021-06-11 LAB — COMPLETE METABOLIC PANEL WITH GFR
AG Ratio: 1.4 (calc) (ref 1.0–2.5)
ALT: 26 U/L (ref 9–46)
AST: 16 U/L (ref 10–35)
Albumin: 4.2 g/dL (ref 3.6–5.1)
Alkaline phosphatase (APISO): 84 U/L (ref 35–144)
BUN/Creatinine Ratio: 21 (calc) (ref 6–22)
BUN: 14 mg/dL (ref 7–25)
CO2: 24 mmol/L (ref 20–32)
Calcium: 9.6 mg/dL (ref 8.6–10.3)
Chloride: 103 mmol/L (ref 98–110)
Creat: 0.68 mg/dL — ABNORMAL LOW (ref 0.70–1.35)
Globulin: 2.9 g/dL (calc) (ref 1.9–3.7)
Glucose, Bld: 187 mg/dL — ABNORMAL HIGH (ref 65–139)
Potassium: 4.3 mmol/L (ref 3.5–5.3)
Sodium: 138 mmol/L (ref 135–146)
Total Bilirubin: 0.4 mg/dL (ref 0.2–1.2)
Total Protein: 7.1 g/dL (ref 6.1–8.1)
eGFR: 106 mL/min/{1.73_m2} (ref >=60)

## 2021-06-11 LAB — CBC WITH DIFFERENTIAL/PLATELET
Absolute Monocytes: 490 cells/uL (ref 200–950)
Basophils Absolute: 43 cells/uL (ref 0–200)
Basophils Relative: 0.5 %
Eosinophils Absolute: 318 cells/uL (ref 15–500)
Eosinophils Relative: 3.7 %
HCT: 38.2 % — ABNORMAL LOW (ref 38.5–50.0)
Hemoglobin: 12.4 g/dL — ABNORMAL LOW (ref 13.2–17.1)
Lymphs Abs: 1187 cells/uL (ref 850–3900)
MCH: 27.8 pg (ref 27.0–33.0)
MCHC: 32.5 g/dL (ref 32.0–36.0)
MCV: 85.7 fL (ref 80.0–100.0)
MPV: 10.4 fL (ref 7.5–12.5)
Monocytes Relative: 5.7 %
Neutro Abs: 6562 cells/uL (ref 1500–7800)
Neutrophils Relative %: 76.3 %
Platelets: 338 10*3/uL (ref 140–400)
RBC: 4.46 10*6/uL (ref 4.20–5.80)
RDW: 13 % (ref 11.0–15.0)
Total Lymphocyte: 13.8 %
WBC: 8.6 10*3/uL (ref 3.8–10.8)

## 2021-06-11 LAB — HEMOGLOBIN A1C
Hgb A1c MFr Bld: 7.8 % of total Hgb — ABNORMAL HIGH (ref <5.7)
Mean Plasma Glucose: 177 mg/dL
eAG (mmol/L): 9.8 mmol/L

## 2021-06-11 LAB — PSA: PSA: 1.63 ng/mL (ref <=4.00)

## 2021-06-11 LAB — LIPID PANEL
Cholesterol: 117 mg/dL (ref <200)
HDL: 44 mg/dL (ref >=40)
LDL Cholesterol (Calc): 49 mg/dL (calc)
Non-HDL Cholesterol (Calc): 73 mg/dL (calc) (ref <130)
Total CHOL/HDL Ratio: 2.7 (calc) (ref <5.0)
Triglycerides: 166 mg/dL — ABNORMAL HIGH (ref <150)

## 2021-06-16 ENCOUNTER — Encounter: Payer: Managed Care, Other (non HMO) | Admitting: Family Medicine

## 2021-06-17 ENCOUNTER — Other Ambulatory Visit: Payer: Self-pay

## 2021-06-17 ENCOUNTER — Other Ambulatory Visit: Payer: Self-pay | Admitting: Family Medicine

## 2021-06-17 ENCOUNTER — Ambulatory Visit (INDEPENDENT_AMBULATORY_CARE_PROVIDER_SITE_OTHER): Payer: Managed Care, Other (non HMO) | Admitting: Family Medicine

## 2021-06-17 VITALS — BP 122/62 | HR 83 | Wt 259.6 lb

## 2021-06-17 DIAGNOSIS — E785 Hyperlipidemia, unspecified: Secondary | ICD-10-CM

## 2021-06-17 DIAGNOSIS — N138 Other obstructive and reflux uropathy: Secondary | ICD-10-CM

## 2021-06-17 DIAGNOSIS — N401 Enlarged prostate with lower urinary tract symptoms: Secondary | ICD-10-CM | POA: Diagnosis not present

## 2021-06-17 DIAGNOSIS — E1169 Type 2 diabetes mellitus with other specified complication: Secondary | ICD-10-CM | POA: Diagnosis not present

## 2021-06-17 DIAGNOSIS — F3342 Major depressive disorder, recurrent, in full remission: Secondary | ICD-10-CM

## 2021-06-17 DIAGNOSIS — Z0000 Encounter for general adult medical examination without abnormal findings: Secondary | ICD-10-CM | POA: Diagnosis not present

## 2021-06-17 MED ORDER — BYDUREON BCISE 2 MG/0.85ML ~~LOC~~ AUIJ: AUTO-INJECTOR | SUBCUTANEOUS | 3 refills | Status: DC

## 2021-06-17 MED ORDER — FLUOXETINE HCL 40 MG PO CAPS: 40.0000 mg | ORAL_CAPSULE | Freq: Every day | ORAL | 3 refills | Status: DC

## 2021-06-17 MED ORDER — LISINOPRIL 2.5 MG PO TABS: 2.5000 mg | ORAL_TABLET | Freq: Every day | ORAL | 3 refills | Status: DC

## 2021-06-17 MED ORDER — METFORMIN HCL 1000 MG PO TABS: 1000.0000 mg | ORAL_TABLET | Freq: Two times a day (BID) | ORAL | 3 refills | Status: DC

## 2021-06-17 MED ORDER — ATORVASTATIN CALCIUM 10 MG PO TABS: ORAL_TABLET | ORAL | 3 refills | Status: DC

## 2021-06-17 MED ORDER — TAMSULOSIN HCL 0.4 MG PO CAPS
0.4000 mg | ORAL_CAPSULE | Freq: Every day | ORAL | 3 refills | Status: DC
Start: 2021-06-17 — End: 2022-06-22

## 2021-06-17 NOTE — Progress Notes (Signed)
Subjective:    Patient ID: Shawn Gordon, male    DOB: 07-22-1960, 60 y.o.   MRN: [id number]  Shawn Gordon is a 60 y.o. male presenting on 06/17/2021 for Annual Exam   HPI  Here for Annual Physical and Lab Review.  CHRONIC DM, Type 2, Morbid Obesity  Last A1c 7.8 (previous 8.0, prior to that mid 7s) CBGs: Rare low sugar hypoglycemia only due to not eating and still working outside triggered, had some hyperglycemia Meds: Metformin 102m BID, Bydureon BCise 251mweekly inj Reports good compliance. Tolerating well w/o side-effects Currently on ACEi Lifestyle: Improved diet overall Walking more Admits R toe tingling if in wrong position - has improved some not having other areas of numbness tingling or pain. Present there for few years. Does not affect him walking and gait. Denies hypoglycemia, polyuria, visual changes, numbness.  HYPERLIPIDEMIA: - Reports no concerns. Last lipid panel 05/2021, controlled  - Currently taking Atorvastatin 1054mtolerating well without side effects or myalgias Lifestyle - Diet: improving limited cholesterol intake.   Major Depression, recurrent chronic, in full remission Chronic problem See PHQ On Fluoxetine 4m30mily   GERD On PPI Omeprazole 4mg55m   BPH Doing well without concern. On Flomax 0.4mg d57my.   Health Maintenance: Due for Flu Shot, declines today despite counseling on benefits  PSA Prostate Cancer Screening - 0.85 up to 1.63, negative.   Shingrix vaccine offered.  Depression screen PHQ 2/[facility]/2022 03/13/2021 12/09/2020  Decreased Interest '1 3 1  ' Down, Depressed, Hopeless 1 3 0  PHQ - 2 Score '2 6 1  ' Altered sleeping 0 1 0  Tired, decreased energy 2 3 0  Change in appetite '2 3 1  ' Feeling bad or failure about yourself  0 2 0  Trouble concentrating '1 2 1  ' Moving slowly or fidgety/restless 0 0 0  Suicidal thoughts 1 1 0  PHQ-9 Score '8 18 3  ' Difficult doing work/chores Somewhat difficult Very difficult Not  difficult at all  Some recent data might be hidden    Past Medical History:  Diagnosis Date   Arthritis    Lower spine, wrist, hips(pre-replacement), shoulders   Asthma    Back pain    Diabetes mellitus, type 2 (HCC)    GERD (gastroesophageal reflux disease)    Presence of dental prosthetic device    2 implants.  1 top, 1 bottom   Past Surgical History:  Procedure Laterality Date   SHOULDER ARTHROSCOPY WITH SUBACROMIAL DECOMPRESSION AND OPEN ROTATOR C Right 05/17/2020   Procedure: Right shoulder arthroscopic subscapularis repair, mini-open supraspinatus repair, subacromial decompression, distal clavicle excision, and open biceps tenodesis;  Surgeon: Patel,Leim Fabry [place];  Laterality: Right;   SHOULDER ARTHROSCOPY WITH SUBACROMIAL DECOMPRESSION, ROTATOR CUFF REPAIR AND BICEP TENDON REPAIR Left 09/01/2019   Procedure: SHOULDER ARTHROSCOPY WITH SUBACROMIAL DECOMPRESSION, ROTATOR CUFF REPAIR AND BICEP TENDON REPAIR, SUBSACAPULAIRS REPAIR, DISTAL CLAVICLE EXCISION;  Surgeon: Patel,Leim Fabry [place] [facility] [place];  Laterality: Left;  Diabetic - oral and injectable meds [name]   TOTAL HIP ARTHROPLASTY Bilateral 2007   Left - 2007, right - 2008   Social History   Socioeconomic History   Marital status: Single    Spouse name: Not on file   Number of children: Not on file   Years of education: Not on file   Highest education level: Not on file  Occupational History   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  Tobacco Use   Smoking status: Former    Packs/day: 2.00    Types: Cigarettes    Start date: 04/19/1978    Quit date: 02/26/2001    Years since quitting: 20.3   Smokeless tobacco: Former  Scientific laboratory technician Use: Never used  Substance and Sexual Activity   Alcohol use: No    Alcohol/week: 0.0 standard drinks   Drug use: No   Sexual activity: Not on file  Other  Topics Concern   Not on file  Social History Narrative   Not on file   Social Determinants of Health   Financial Resource Strain: Not on file  Food Insecurity: Not on file  Transportation Needs: Not on file  Physical Activity: Not on file  Stress: Not on file  Social Connections: Not on file  Intimate Partner Violence: Not on file   Family History  Problem Relation Age of Onset   Stroke Mother    Stroke Father    Cancer Father    Hypertension Paternal Grandfather    Current Outpatient Medications on File Prior to Visit  Medication Sig   B Complex Vitamins (B COMPLEX PO) Take by mouth daily.   Cholecalciferol (VITAMIN D3 PO) Take by mouth daily.   glucose blood (TRUETEST TEST) test strip Check Blood Sugar 1-2 Times daily.   omeprazole (PRILOSEC) 40 MG capsule TAKE 1 CAPSULE (40 MG TOTAL) BY MOUTH 2 (TWO) TIMES DAILY BEFORE A MEAL.   loratadine (CLARITIN) 10 MG tablet Take 10 mg by mouth daily as needed.  (Patient not taking: Reported on 03/13/2021)   No current facility-administered medications on file prior to visit.    Review of Systems  Constitutional:  Negative for activity change, appetite change, chills, diaphoresis, fatigue and fever.  HENT:  Negative for congestion and hearing loss.   Eyes:  Negative for visual disturbance.  Respiratory:  Negative for cough, chest tightness, shortness of breath and wheezing.   Cardiovascular:  Negative for chest pain, palpitations and leg swelling.  Gastrointestinal:  Negative for abdominal pain, constipation, diarrhea, nausea and vomiting.  Genitourinary:  Negative for dysuria, frequency and hematuria.  Musculoskeletal:  Negative for arthralgias and neck pain.  Skin:  Negative for rash.  Neurological:  Negative for dizziness, weakness, light-headedness, numbness and headaches.  Hematological:  Negative for adenopathy.  Psychiatric/Behavioral:  Negative for behavioral problems, dysphoric mood and sleep disturbance.   Per HPI unless  specifically indicated above      Objective:    BP 122/62 (BP Location: Left Arm, Patient Position: Sitting, Cuff Size: Normal)   Pulse 83   Wt 259 lb 9.6 oz (117.8 kg)   SpO2 97%   BMI 37.79 kg/m   Wt Readings from Last 3 Encounters:  06/17/21 259 lb 9.6 oz (117.8 kg)  03/13/21 259 lb 12.8 oz (117.8 kg)  12/09/20 260 lb 3.2 oz (118 kg)    Physical Exam Vitals and nursing note reviewed.  Constitutional:      General: He is not in acute distress.    Appearance: He is well-developed. He is not diaphoretic.     Comments: Well-appearing, comfortable, cooperative  HENT:     Head: Normocephalic and atraumatic.  Eyes:     General:        Right eye: No discharge.        Left eye: No discharge.     Conjunctiva/sclera: Conjunctivae normal.     Pupils: Pupils are equal, round, and reactive to light.  Neck:     Thyroid: No thyromegaly.  Cardiovascular:     Rate and Rhythm: Normal rate and regular rhythm.     Pulses: Normal pulses.     Heart sounds: Normal heart sounds. No murmur heard. Pulmonary:     Effort: Pulmonary effort is normal. No respiratory distress.     Breath sounds: Normal breath sounds. No wheezing or rales.  Abdominal:     General: Bowel sounds are normal. There is no distension.     Palpations: Abdomen is soft. There is no mass.     Tenderness: There is no abdominal tenderness.  Musculoskeletal:        General: No tenderness. Normal range of motion.     Cervical back: Normal range of motion and neck supple.     Right lower leg: No edema.     Left lower leg: No edema.     Comments: Upper / Lower Extremities: - Normal muscle tone, strength bilateral upper extremities 5/5, lower extremities 5/5  Lymphadenopathy:     Cervical: No cervical adenopathy.  Skin:    General: Skin is warm and dry.     Findings: No erythema or rash.  Neurological:     Mental Status: He is alert and oriented to person, place, and time.     Comments: Distal sensation intact to light  touch all extremities  Psychiatric:        Mood and Affect: Mood normal.        Behavior: Behavior normal.        Thought Content: Thought content normal.     Comments: Well groomed, good eye contact, normal speech and thoughts     Diabetic Foot Exam - Simple   Simple Foot Form Diabetic Foot exam was performed with the following findings: Yes 06/17/2021  8:39 AM  Visual Inspection See comments: Yes Sensation Testing Intact to touch and monofilament testing bilaterally: Yes Pulse Check Posterior Tibialis and Dorsalis pulse intact bilaterally: Yes Comments Bilateral feet with great toe deformity, some discoloration whitening of R 3rd toe. Varicose veins of ankles. Dry skin callus formation. No erythema or ulceration.      Results for orders placed or performed in visit on 06/09/21  PSA  Result Value Ref Range   PSA 1.63 < OR = 4.00 ng/mL  Lipid panel  Result Value Ref Range   Cholesterol 117 <200 mg/dL   HDL 44 > OR = 40 mg/dL   Triglycerides 166 (H) <150 mg/dL   LDL Cholesterol (Calc) 49 mg/dL (calc)   Total CHOL/HDL Ratio 2.7 <5.0 (calc)   Non-HDL Cholesterol (Calc) 73 <130 mg/dL (calc)  COMPLETE METABOLIC PANEL WITH GFR  Result Value Ref Range   Glucose, Bld 187 (H) 65 - 139 mg/dL   BUN 14 7 - 25 mg/dL   Creat 0.68 (L) 0.70 - 1.35 mg/dL   eGFR 106 > OR = 60 mL/min/1.83m   BUN/Creatinine Ratio 21 6 - 22 (calc)   Sodium 138 135 - 146 mmol/L   Potassium 4.3 3.5 - 5.3 mmol/L   Chloride 103 98 - 110 mmol/L   CO2 24 20 - 32 mmol/L   Calcium 9.6 8.6 - 10.3 mg/dL   Total Protein 7.1 6.1 - 8.1 g/dL   Albumin 4.2 3.6 - 5.1 g/dL   Globulin 2.9 1.9 - 3.7 g/dL (calc)   AG Ratio 1.4 1.0 - 2.5 (calc)   Total Bilirubin 0.4 0.2 - 1.2 mg/dL   Alkaline phosphatase (APISO) 84 35 - 144 U/L  AST 16 10 - 35 U/L   ALT 26 9 - 46 U/L  CBC with Differential/Platelet  Result Value Ref Range   WBC 8.6 3.8 - 10.8 Thousand/uL   RBC 4.46 4.20 - 5.80 Million/uL   Hemoglobin 12.4 (L)  13.2 - 17.1 g/dL   HCT 38.2 (L) 38.5 - 50.0 %   MCV 85.7 80.0 - 100.0 fL   MCH 27.8 27.0 - 33.0 pg   MCHC 32.5 32.0 - 36.0 g/dL   RDW 13.0 11.0 - 15.0 %   Platelets 338 140 - 400 Thousand/uL   MPV 10.4 7.5 - 12.5 fL   Neutro Abs 6,562 1,500 - 7,800 cells/uL   Lymphs Abs 1,187 850 - 3,900 cells/uL   Absolute Monocytes 490 200 - 950 cells/uL   Eosinophils Absolute 318 15 - 500 cells/uL   Basophils Absolute 43 0 - 200 cells/uL   Neutrophils Relative % 76.3 %   Total Lymphocyte 13.8 %   Monocytes Relative 5.7 %   Eosinophils Relative 3.7 %   Basophils Relative 0.5 %  Hemoglobin A1c  Result Value Ref Range   Hgb A1c MFr Bld 7.8 (H) <5.7 % of total Hgb   Mean Plasma Glucose 177 mg/dL   eAG (mmol/L) 9.8 mmol/L      Assessment & Plan:   Problem List Items Addressed This Visit     Type 2 diabetes mellitus with other specified complication (HCC)    Stable - A1c 7.8, had improved since 8.0 but still higher 7 range On GLP1 improved adherence Complications - other including hyperlipidemia, GERD, obesity - increases risk of future cardiovascular complications   Plan:  1. Continue Bydureon BCise 58m weekly inj - Continue Metformin 10040mBID Goal w/ wt loss to reduce meds DM Foot exam 2. Encourage improved lifestyle - low carb, low sugar diet, reduce portion size, continue improving regular exercise 3. Check CBG , bring log to next visit for review      Relevant Medications   atorvastatin (LIPITOR) 10 MG tablet   Exenatide ER (BYDUREON BCISE) 2 MG/0.85ML AUIJ   lisinopril (ZESTRIL) 2.5 MG tablet   metFORMIN (GLUCOPHAGE) 1000 MG tablet   Morbid obesity (HCC)    BMI >37 with risk factors comorbid type 2 diabetes, and hyperlipidemia, meet criteria for morbid obesity dx  Encourage wt loss and lifestyle, on GLP1      Relevant Medications   Exenatide ER (BYDUREON BCISE) 2 MG/0.85ML AUIJ   metFORMIN (GLUCOPHAGE) 1000 MG tablet   Major depressive disorder, recurrent, in full  remission ([place]   Chronic major depression in remission See PHQ Continue SSRI Fluoxetine      Relevant Medications   FLUoxetine (PROZAC) 40 MG capsule   Hyperlipidemia associated with type 2 diabetes mellitus ([place]   Controlled cholesterol on statin and lifestyle Last lipid panel 05/2021   Plan: 1. Continue current meds - Atorvastatin 1054m. Encourage improved lifestyle - low carb/cholesterol, reduce portion size, continue improving regular exercise      Relevant Medications   atorvastatin (LIPITOR) 10 MG tablet   Exenatide ER (BYDUREON BCISE) 2 MG/0.85ML AUIJ   lisinopril (ZESTRIL) 2.5 MG tablet   metFORMIN (GLUCOPHAGE) 1000 MG tablet   BPH with obstruction/lower urinary tract symptoms    BPH Improved on Tamsulosin but still has urinary symptoms  Elevated PSA from 0.8 up to 1.6, but still in normal range. Will repeat PSA in 6 months.      Relevant Medications   tamsulosin (FLOMAX) 0.4 MG  CAPS capsule   Other Visit Diagnoses     Annual physical exam    -  Primary       Updated Health Maintenance information Reviewed recent lab results with patient Encouraged improvement to lifestyle with diet and exercise Goal of weight loss   Meds ordered this encounter  Medications   atorvastatin (LIPITOR) 10 MG tablet    Sig: TAKE 1 TABLET BY MOUTH EVERYDAY AT BEDTIME    Dispense:  90 tablet    Refill:  3   Exenatide ER (BYDUREON BCISE) 2 MG/0.85ML AUIJ    Sig: INJECT 2 MG INTO THE SKIN ONCE A WEEK.    Dispense:  10.2 mL    Refill:  3    Keep refills on file   FLUoxetine (PROZAC) 40 MG capsule    Sig: Take 1 capsule (40 mg total) by mouth daily.    Dispense:  90 capsule    Refill:  3   lisinopril (ZESTRIL) 2.5 MG tablet    Sig: Take 1 tablet (2.5 mg total) by mouth daily.    Dispense:  90 tablet    Refill:  3    Add refills   metFORMIN (GLUCOPHAGE) 1000 MG tablet    Sig: Take 1 tablet (1,000 mg total) by mouth 2 (two) times daily with a meal.    Dispense:  180  tablet    Refill:  3    Add refills   tamsulosin (FLOMAX) 0.4 MG CAPS capsule    Sig: Take 1 capsule (0.4 mg total) by mouth daily after breakfast.    Dispense:  90 capsule    Refill:  3     Follow up plan: Return in about 6 months (around 12/15/2021) for 6 month non fasting labs (A1c+PSA) then 1 week later Follow-up DM, updates.   Future labs 12/11/21 A1c + PSA repeat 6 months  [name], DO [facility] 06/17/2021, 8:20 AM

## 2021-06-17 NOTE — Assessment & Plan Note (Signed)
BPH Improved on Tamsulosin but still has urinary symptoms  Elevated PSA from 0.8 up to 1.6, but still in normal range. Will repeat PSA in 6 months.

## 2021-06-17 NOTE — Assessment & Plan Note (Signed)
Chronic major depression in remission See PHQ Continue SSRI Fluoxetine

## 2021-06-17 NOTE — Patient Instructions (Addendum)
Thank you for coming to the office today.  Keep up the great work  Recent Labs    12/09/20 0823 03/13/21 1511 06/10/21 0816  HGBA1C 7.3* 8.0* 7.8*   DUE for NON FASTING BLOOD WORK  SCHEDULE "Lab Only" visit in the morning at the clinic for lab draw in 6 MONTHS   - Make sure Lab Only appointment is at about 1 week before your next appointment, so that results will be available  For Lab Results, once available within 2-3 days of blood draw, you can can log in to MyChart online to view your results and a brief explanation. Also, we can discuss results at next follow-up visit.    Please schedule a Follow-up Appointment to: Return in about 6 months (around 12/15/2021) for 6 month non fasting labs (A1c+PSA) then 1 week later Follow-up DM, updates.  If you have any other questions or concerns, please feel free to call the office or send a message through [employer]. You may also schedule an earlier appointment if necessary.  Additionally, you may be receiving a survey about your experience at our office within a few days to 1 week by e-mail or mail. We value your feedback.  [name], DO [place]

## 2021-06-17 NOTE — Assessment & Plan Note (Signed)
Controlled cholesterol on statin and lifestyle Last lipid panel 05/2021   Plan: 1. Continue current meds - Atorvastatin 10mg  2. Encourage improved lifestyle - low carb/cholesterol, reduce portion size, continue improving regular exercise

## 2021-06-17 NOTE — Assessment & Plan Note (Signed)
Stable - A1c 7.8, had improved since 8.0 but still higher 7 range On GLP1 improved adherence Complications - other including hyperlipidemia, GERD, obesity - increases risk of future cardiovascular complications   Plan:  1. Continue Bydureon BCise 2mg  weekly inj - Continue Metformin 1000mg  BID Goal w/ wt loss to reduce meds DM Foot exam 2. Encourage improved lifestyle - low carb, low sugar diet, reduce portion size, continue improving regular exercise 3. Check CBG , bring log to next visit for review

## 2021-06-17 NOTE — Assessment & Plan Note (Signed)
BMI >37 with risk factors comorbid type 2 diabetes, and hyperlipidemia, meet criteria for morbid obesity dx  Encourage wt loss and lifestyle, on GLP1 

## 2021-08-24 ENCOUNTER — Other Ambulatory Visit: Payer: Self-pay | Admitting: Family Medicine

## 2021-08-24 DIAGNOSIS — K219 Gastro-esophageal reflux disease without esophagitis: Secondary | ICD-10-CM

## 2021-08-25 NOTE — Telephone Encounter (Signed)
Requested Prescriptions  Pending Prescriptions Disp Refills   omeprazole (PRILOSEC) 40 MG capsule [Pharmacy Med Name: OMEPRAZOLE DR 40 MG CAPSULE] 180 capsule 1    Sig: TAKE 1 CAPSULE (40 MG TOTAL) BY MOUTH 2 (TWO) TIMES DAILY BEFORE A MEAL.     Gastroenterology: Proton Pump Inhibitors Passed - 08/24/2021 12:51 AM      Passed - Valid encounter within last 12 months    Recent Outpatient Visits          2 months ago Annual physical exam   [facility] [place], Devonne Doughty, DO   5 months ago Heat intolerance   [facility], [place], NP   8 months ago Type 2 diabetes mellitus with other specified complication, without long-term current use of insulin [facility])   [place], DO   1 year ago Annual physical exam   [facility] Olin Hauser, DO   1 year ago Type 2 diabetes mellitus with hyperglycemia, without long-term current use of insulin [facility])   [facility] [place], Devonne Doughty, DO      Future Appointments            In 3 months [place], Devonne Doughty, DO [facility], [facility]

## 2021-11-11 ENCOUNTER — Encounter: Payer: Self-pay | Admitting: Family Medicine

## 2021-11-11 ENCOUNTER — Ambulatory Visit (INDEPENDENT_AMBULATORY_CARE_PROVIDER_SITE_OTHER): Payer: Managed Care, Other (non HMO) | Admitting: Family Medicine

## 2021-11-11 ENCOUNTER — Ambulatory Visit: Payer: Self-pay | Admitting: *Deleted

## 2021-11-11 VITALS — BP 128/62 | HR 98 | Ht 69.5 in | Wt 269.0 lb

## 2021-11-11 DIAGNOSIS — L821 Other seborrheic keratosis: Secondary | ICD-10-CM

## 2021-11-11 NOTE — Telephone Encounter (Signed)
Per agent:  ?"Patient concerned about 2 growths on back. Patient denied appointment for tomorrow. Patient unsure if growths can cancerous and would like to be seen today. No available appointments " ? ? ?Chief Complaint: Growth on back ?Symptoms: 2 dime size dry, brownish growths on right side of back ?Frequency: noted this AM "Where not there 2 weeks ago." ?Pertinent Negatives: Patient denies pain,itching ?Disposition: '[]'$ ED /'[]'$ Urgent Care (no appt availability in office) / '[]'$ Appointment(In office/virtual)/ '[]'$  [facility]/ '[]'$ Home Care/ '[x]'$ Refused Recommended Disposition /'[]'$ [place] [facility]/ '[]'$  Follow-up with PCP ?Additional Notes: Pt reluctant to triage. States his cousin had skin cancer and "It looked like this." Very anxious. Requesting appt today. Declined appt tomorrow , offered by agent. Declined next available for Friday. Assured pt NT would route to practice for PCPs review. ?Please advise. ?

## 2021-11-11 NOTE — Patient Instructions (Addendum)
Thank you for coming to the office today. ? ?Seborrheic keratosis (seb-o-REE-ik care-uh-TOE-sis) is a common skin growth. It may seem worrisome because it can look like a wart, pre-cancerous skin growth (actinic keratosis), or skin cancer. Despite their appearance, seborrheic keratoses are harmless. ?Most people get these growths when they are middle aged or older. Because they begin at a later age and can have a wart-like appearance, seborrheic keratoses are often called the ?barnacles of aging.? ?It?s possible to have just one of these growths, but most people develop several. Some growths may have a warty surface while others look like dabs of warm, brown candle wax on the skin. ?Seborrheic keratoses range in color from white to black; however, most are tan or brown. ?You can find these harmless growths anywhere on the skin, except the palms and soles. Most often, you?ll see them on the chest, back, head, or neck. ? ?[facility]   ?[address] ?[place], [place] [address] ?Hours: 8AM-5PM ?Phone: [phone] ? ? ?Please schedule a Follow-up Appointment to: Return if symptoms worsen or fail to improve. ? ?If you have any other questions or concerns, please feel free to call the office or send a message through [place]. You may also schedule an earlier appointment if necessary. ? ?Additionally, you may be receiving a survey about your experience at our office within a few days to 1 week by e-mail or mail. We value your feedback. ? ?[name], DO ?[place] ?

## 2021-11-11 NOTE — Progress Notes (Signed)
? ?Subjective:  ? ? Patient ID: Shawn Gordon, male    DOB: [birth year], 61 y.o.   MRN: [id number] ? ?Shawn Gordon is a 61 y.o. male presenting on 11/11/2021 for [place] ? ?Patient presents for a same day appointment. ? ?HPI ? ?Abnormal Skin Lesion on Back ?Seborrheic Keratosis ?Reports new problem onset 4-6 weeks unsure exactly with identified new skin finding pigmented raised rough lesions x 2 R side of low back vs flank, did not recall seeing them before. Not painful, admits some itching. These were incidental finding the other day when looking in mirror. ?Does not have dermatologist. No history of skin cancer. He does get some sun exposure. ? ? ?  11/11/2021  ?  3:09 PM 06/17/2021  ?  8:18 AM 03/13/2021  ?  2:56 PM  ?Depression screen PHQ 2/9  ?Decreased Interest _0 ?Down, Depressed, Hopeless _1 ?PHQ - 2 Score _2 ?Altered sleeping 0 0 1  ?Tired, decreased energy _3 ?Change in appetite _4 ?Feeling bad or failure about yourself  1 0 2  ?Trouble concentrating _5 ?Moving slowly or fidgety/restless 0 0 0  ?Suicidal thoughts 0 1 1  ?PHQ-9 Score _6 ?Difficult doing work/chores Not difficult at all Somewhat difficult Very difficult  ? ? ?Social History  ? ?Tobacco Use  ? Smoking status: Former  ?  Packs/day: 2.00  ?  Types: Cigarettes  ?  Start date: 04/19/1978  ?  Quit date: 02/26/2001  ?  Years since quitting: 20.7  ? Smokeless tobacco: Former  ?Vaping Use  ? Vaping Use: Never used  ?Substance Use Topics  ? Alcohol use: No  ?  Alcohol/week: 0.0 standard drinks  ? Drug use: No  ? ? ?Review of Systems ?Per HPI unless specifically indicated above ? ?   ?Objective:  ?  ?BP 128/62   Pulse 98   Ht 5' 9.5" (1.765 m)   Wt 269 lb (122 kg)   SpO2 96%   BMI 39.15 kg/m?   ?Wt Readings from Last 3 Encounters:  ?11/11/21 269 lb (122 kg)  ?06/17/21 259 lb 9.6 oz (117.8 kg)  ?03/13/21 259 lb 12.8 oz (117.8 kg)  ?  ?Physical Exam ?Vitals and nursing note reviewed.  ?Constitutional:   ?   General: He is  not in acute distress. ?   Appearance: He is well-developed. He is not diaphoretic.  ?Skin: ?   General: Skin is warm and dry.  ?Neurological:  ?   Mental Status: He is alert.  ? ? ?Right Low Back / Flank ?Seborrheic keratosis with pigmented raised scaly lesions ? ? ? ?Results for orders placed or performed in visit on 06/09/21  ?PSA  ?Result Value Ref Range  ? PSA 1.63 < OR = 4.00 ng/mL  ?Lipid panel  ?Result Value Ref Range  ? Cholesterol 117 <200 mg/dL  ? HDL 44 > OR = 40 mg/dL  ? Triglycerides 166 (H) <150 mg/dL  ? LDL Cholesterol (Calc) 49 mg/dL (calc)  ? Total CHOL/HDL Ratio 2.7 <5.0 (calc)  ? Non-HDL Cholesterol (Calc) 73 <130 mg/dL (calc)  ?COMPLETE METABOLIC PANEL WITH GFR  ?Result Value Ref Range  ? Glucose, Bld 187 (H) 65 - 139 mg/dL  ? BUN 14 7 - 25 mg/dL  ? Creat 0.68 (L) 0.70 - 1.35 mg/dL  ? eGFR 106 > OR = 60 mL/min/1.57m  ? BUN/Creatinine Ratio 21  6 - 22 (calc)  ? Sodium 138 135 - 146 mmol/L  ? Potassium 4.3 3.5 - 5.3 mmol/L  ? Chloride 103 98 - 110 mmol/L  ? CO2 24 20 - 32 mmol/L  ? Calcium 9.6 8.6 - 10.3 mg/dL  ? Total Protein 7.1 6.1 - 8.1 g/dL  ? Albumin 4.2 3.6 - 5.1 g/dL  ? Globulin 2.9 1.9 - 3.7 g/dL (calc)  ? AG Ratio 1.4 1.0 - 2.5 (calc)  ? Total Bilirubin 0.4 0.2 - 1.2 mg/dL  ? Alkaline phosphatase (APISO) 84 35 - 144 U/L  ? AST 16 10 - 35 U/L  ? ALT 26 9 - 46 U/L  ?CBC with Differential/Platelet  ?Result Value Ref Range  ? WBC 8.6 3.8 - 10.8 Thousand/uL  ? RBC 4.46 4.20 - 5.80 Million/uL  ? Hemoglobin 12.4 (L) 13.2 - 17.1 g/dL  ? HCT 38.2 (L) 38.5 - 50.0 %  ? MCV 85.7 80.0 - 100.0 fL  ? MCH 27.8 27.0 - 33.0 pg  ? MCHC 32.5 32.0 - 36.0 g/dL  ? RDW 13.0 11.0 - 15.0 %  ? Platelets 338 140 - 400 Thousand/uL  ? MPV 10.4 7.5 - 12.5 fL  ? Neutro Abs 6,562 1,500 - 7,800 cells/uL  ? Lymphs Abs 1,187 850 - 3,900 cells/uL  ? Absolute Monocytes 490 200 - 950 cells/uL  ? Eosinophils Absolute 318 15 - 500 cells/uL  ? Basophils Absolute 43 0 - 200 cells/uL  ? Neutrophils Relative % 76.3 %  ? Total  Lymphocyte 13.8 %  ? Monocytes Relative 5.7 %  ? Eosinophils Relative 3.7 %  ? Basophils Relative 0.5 %  ?Hemoglobin A1c  ?Result Value Ref Range  ? Hgb A1c MFr Bld 7.8 (H) <5.7 % of total Hgb  ? Mean Plasma Glucose 177 mg/dL  ? eAG (mmol/L) 9.8 mmol/L  ? ?   ?Assessment & Plan:  ? ?Problem List Items Addressed This Visit   ?None ?Visit Diagnoses   ? ? SK (seborrheic keratosis)    -  Primary  ? Relevant Orders  ? Ambulatory referral to Dermatology  ? ?  ?  ?Referral sent to Dermatology for TBSE and multiple new SKs on back. Appear to be benign, no complications. ? ? ?Orders Placed This Encounter  ?Procedures  ? Ambulatory referral to Dermatology  ?  Referral Priority:   Routine  ?  Referral Type:   Consultation  ?  Referral Reason:   Specialty Services Required  ?  Requested Specialty:   Dermatology  ?  Number of Visits Requested:   1  ? ? ?No orders of the defined types were placed in this encounter. ? ? ? ? ?Follow up plan: ?Return if symptoms worsen or fail to improve. ? ? ?[name], DO ?[facility] ?[facility] ?11/11/2021, 3:39 PM ?

## 2021-11-11 NOTE — Telephone Encounter (Signed)
Reason for Disposition ? [1] Skin growth or mole AND [2] new ? ?Answer Assessment - Initial Assessment Questions ?1. APPEARANCE of LESION: "What does it look like?"  ?    2 dry brownish areas ?2. SIZE: "How big is it?" (e.g., inches, cm; or compare to size of pinhead, tip of pen, eraser, coin, pea, grape, ping pong ball)  ?    Dime size ?3. COLOR: "What color is it?" "Is there more than one color?" ?    brownish ?4. SHAPE: "What shape is it?" (e.g., round, irregular) ?    oblong ?5. RAISED: "Does it stick up above the skin or is it flat?" (e.g., raised or elevated) ?    unsure ?6. TENDER: "Does it hurt when you touch it?"  (Scale 1-10; or mild, moderate, severe) ?    no ?7. LOCATION: "Where is it located?"  ?    Right side of back ?8. ONSET: "When did it first appear?"  ?    Noted this AM "Was not there 2 weeks ago" ?9. NUMBER: "Is there just one?" or "Are there others?" ?    2 ?10. CAUSE: "What do you think it is?" ?      "Looks like cancer" ?11. OTHER SYMPTOMS: "Do you have any other symptoms?" (e.g., fever) ?      no ? ?Protocols used: Skin Lesion - Moles or Growths-A-AH ? ?

## 2021-12-11 ENCOUNTER — Other Ambulatory Visit: Payer: Managed Care, Other (non HMO)

## 2021-12-11 DIAGNOSIS — N401 Enlarged prostate with lower urinary tract symptoms: Secondary | ICD-10-CM

## 2021-12-11 DIAGNOSIS — E1169 Type 2 diabetes mellitus with other specified complication: Secondary | ICD-10-CM

## 2021-12-12 LAB — HEMOGLOBIN A1C
Hgb A1c MFr Bld: 8.6 % of total Hgb — ABNORMAL HIGH (ref <5.7)
Mean Plasma Glucose: 200 mg/dL
eAG (mmol/L): 11.1 mmol/L

## 2021-12-12 LAB — PSA: PSA: 1.01 ng/mL (ref <=4.00)

## 2021-12-18 ENCOUNTER — Other Ambulatory Visit: Payer: Self-pay | Admitting: Family Medicine

## 2021-12-18 ENCOUNTER — Ambulatory Visit (INDEPENDENT_AMBULATORY_CARE_PROVIDER_SITE_OTHER): Payer: Managed Care, Other (non HMO) | Admitting: Family Medicine

## 2021-12-18 ENCOUNTER — Encounter: Payer: Self-pay | Admitting: Family Medicine

## 2021-12-18 VITALS — BP 120/74 | HR 91 | Ht 69.5 in | Wt 268.2 lb

## 2021-12-18 DIAGNOSIS — N401 Enlarged prostate with lower urinary tract symptoms: Secondary | ICD-10-CM

## 2021-12-18 DIAGNOSIS — F331 Major depressive disorder, recurrent, moderate: Secondary | ICD-10-CM

## 2021-12-18 DIAGNOSIS — K219 Gastro-esophageal reflux disease without esophagitis: Secondary | ICD-10-CM

## 2021-12-18 DIAGNOSIS — E1169 Type 2 diabetes mellitus with other specified complication: Secondary | ICD-10-CM

## 2021-12-18 DIAGNOSIS — N138 Other obstructive and reflux uropathy: Secondary | ICD-10-CM

## 2021-12-18 DIAGNOSIS — Z0000 Encounter for general adult medical examination without abnormal findings: Secondary | ICD-10-CM

## 2021-12-18 MED ORDER — BUPROPION HCL ER (XL) 150 MG PO TB24: 150.0000 mg | ORAL_TABLET | Freq: Every day | ORAL | 1 refills | Status: DC

## 2021-12-18 NOTE — Progress Notes (Signed)
Subjective:    Patient ID: Shawn Gordon, male    DOB: 1961/06/15, 61 y.o.   MRN: [id number]  Shawn Gordon is a 61 y.o. male presenting on 12/18/2021 for Diabetes   HPI  CHRONIC DM, Type 2, Morbid Obesity  Interval Update with A1c elevated now up to 8.6 CBGs: Rare low sugar hypoglycemia only due to not eating and still working outside triggered, had some hyperglycemia Meds: Metformin '1000mg'$  BID, Bydureon BCise '2mg'$  weekly inj (he was given 30 day not 90 day, he missed 2 week doses recently, has resumed) Reports good compliance. Tolerating well w/o side-effects Currently on ACEi Lifestyle: Improved diet overall Walking more Admits R toe tingling if in wrong position - has improved some not having other areas of numbness tingling or pain. Present there for few years. Does not affect him walking and gait. Denies hypoglycemia, polyuria, visual changes, numbness.   HYPERLIPIDEMIA: - Reports no concerns. Last lipid panel 05/2021, controlled  - Currently taking Atorvastatin '10mg'$ , tolerating well without side effects or myalgias Lifestyle - Diet: improving limited cholesterol intake.   Major Depression, recurrent chronic  Worsening mood lately, living alone and he says depression worsening causing him to have more stress eating abnormal patterns.  Chronic problem See PHQ On Fluoxetine '40mg'$  daily, feels like ineffective. Not on other medications before.   GERD Admits worsening problem with inc acid reflux. Admits late night eating and stress eating. On PPI Omeprazole '40mg'$  BID    BPH Doing well without concern. On Flomax 0.'4mg'$  daily.     Health Maintenance:    PSA Prostate Cancer Screening - 0.85 up to 1.63 now most recent lab shows improved down to 1.01 (11/2021)     12/18/2021    8:38 AM 11/11/2021    3:09 PM 06/17/2021    8:18 AM  Depression screen PHQ 2/9  Decreased Interest '3 2 1  '$ Down, Depressed, Hopeless '3 1 1  '$ PHQ - 2 Score '6 3 2  '$ Altered sleeping 2 0 0   Tired, decreased energy '3 1 2  '$ Change in appetite '3 2 2  '$ Feeling bad or failure about yourself  3 1 0  Trouble concentrating '3 1 1  '$ Moving slowly or fidgety/restless 0 0 0  Suicidal thoughts 0 0 1  PHQ-9 Score '20 8 8  '$ Difficult doing work/chores Extremely dIfficult Not difficult at all Somewhat difficult      12/18/2021    8:38 AM 11/11/2021    3:09 PM 06/17/2021    8:16 AM 12/09/2020    8:13 AM  GAD 7 : Generalized Anxiety Score  Nervous, Anxious, on Edge 2 0 1 0  Control/stop worrying '3 1 1 '$ 0  Worry too much - different things 3 1 0 1  Trouble relaxing 2 0 0 0  Restless 1 1 0 1  Easily annoyed or irritable '3 2 1 1  '$ Afraid - awful might happen 0 0 0 0  Total GAD 7 Score '14 5 3 3  '$ Anxiety Difficulty Somewhat difficult Not difficult at all Not difficult at all Not difficult at all      Social History   Tobacco Use   Smoking status: Former    Packs/day: 2.00    Types: Cigarettes    Start date: 04/19/1978    Quit date: 02/26/2001    Years since quitting: 20.8   Smokeless tobacco: Former  Scientific laboratory technician Use: Never used  Substance Use Topics   Alcohol use: No  Alcohol/week: 0.0 standard drinks   Drug use: No    Review of Systems Per HPI unless specifically indicated above     Objective:    BP 120/74   Pulse 91   Ht 5' 9.5" (1.765 m)   Wt 268 lb 3.2 oz (121.7 kg)   SpO2 98%   BMI 39.04 kg/m   Wt Readings from Last 3 Encounters:  12/18/21 268 lb 3.2 oz (121.7 kg)  11/11/21 269 lb (122 kg)  06/17/21 259 lb 9.6 oz (117.8 kg)    Physical Exam Vitals and nursing note reviewed.  Constitutional:      General: He is not in acute distress.    Appearance: He is well-developed. He is obese. He is not diaphoretic.     Comments: Well-appearing, comfortable, cooperative  HENT:     Head: Normocephalic and atraumatic.  Eyes:     General:        Right eye: No discharge.        Left eye: No discharge.     Conjunctiva/sclera: Conjunctivae normal.  Neck:      Thyroid: No thyromegaly.  Cardiovascular:     Rate and Rhythm: Normal rate and regular rhythm.     Pulses: Normal pulses.     Heart sounds: Normal heart sounds. No murmur heard. Pulmonary:     Effort: Pulmonary effort is normal. No respiratory distress.     Breath sounds: Normal breath sounds. No wheezing or rales.  Musculoskeletal:        General: Normal range of motion.     Cervical back: Normal range of motion and neck supple.  Lymphadenopathy:     Cervical: No cervical adenopathy.  Skin:    General: Skin is warm and dry.     Findings: No erythema or rash.  Neurological:     Mental Status: He is alert and oriented to person, place, and time. Mental status is at baseline.  Psychiatric:        Behavior: Behavior normal.     Comments: Well groomed, good eye contact, normal speech and thoughts    Recent Labs    03/13/21 1511 06/10/21 0816 12/11/21 0754  HGBA1C 8.0* 7.8* 8.6*    Results for orders placed or performed in visit on 12/11/21  PSA  Result Value Ref Range   PSA 1.01 < OR = 4.00 ng/mL  Hemoglobin A1c  Result Value Ref Range   Hgb A1c MFr Bld 8.6 (H) <5.7 % of total Hgb   Mean Plasma Glucose 200 mg/dL   eAG (mmol/L) 11.1 mmol/L      Assessment & Plan:   Problem List Items Addressed This Visit     Type 2 diabetes mellitus with other specified complication ([place])    Uncontrolled DM A1c 8.6 now 1 month off GLP and worse diet habits with stress eating / depression Complications - other including hyperlipidemia, GERD, obesity - increases risk of future cardiovascular complications   Plan:  1. Finish Bydureon '2mg'$  weekly inj for remaining 2 month supply, then he will notify me and I can switch him to [facility] 2.'5mg'$  weekly inj x 4 weeks then dose increase monthly until appropriate dose approx 5-'10mg'$  range - Continue Metformin '1000mg'$  BID - future may discontinue if controlled on GLP/GIP therapy Goal w/ wt loss to reduce meds 2. Encourage improved lifestyle - low  carb, low sugar diet, reduce portion size, continue improving regular exercise 3. Check CBG , bring log to next visit for review  Need DM Eye  exam      Morbid obesity ([place])    BMI >39 with risk factors comorbid type 2 diabetes, and hyperlipidemia, meet criteria for morbid obesity dx  Encourage wt loss and lifestyle, on GLP1       Major depressive disorder, recurrent, moderate (H[facility]) - Primary    Worsening depression See PHQ Ineffective control w/ SSRI monotherapy Fluoxetine '40mg'$  daily  Start Wellbutrin XL '150mg'$  daily, can contribute to appetite suppression wt loss and will augment SSRI Recommend therapy CBT, handout given he can schedule or we can refer if chooses location       Relevant Medications   buPROPion (WELLBUTRIN XL) 150 MG 24 hr tablet   GERD (gastroesophageal reflux disease)    GERD Less controlled due to wt gain, late  night eating / stress Will recommend continue '40mg'$  PPI BID dosing, and modify the above factors  Meds ordered this encounter  Medications   buPROPion (WELLBUTRIN XL) 150 MG 24 hr tablet    Sig: Take 1 tablet (150 mg total) by mouth daily. In morning.    Dispense:  90 tablet    Refill:  1     Follow up plan: Return in about 6 months (around 06/19/2022) for 6 month fasting lab only then 1 week later Annual Physical.  Future labs ordered for  06/15/22   [name], [facility] 12/18/2021, 8:35 AM

## 2021-12-18 NOTE — Assessment & Plan Note (Signed)
Worsening depression See PHQ Ineffective control w/ SSRI monotherapy Fluoxetine '40mg'$  daily  Start Wellbutrin XL '150mg'$  daily, can contribute to appetite suppression wt loss and will augment SSRI Recommend therapy CBT, handout given he can schedule or we can refer if chooses location

## 2021-12-18 NOTE — Assessment & Plan Note (Signed)
Uncontrolled DM A1c 8.6 now 1 month off GLP and worse diet habits with stress eating / depression Complications - other including hyperlipidemia, GERD, obesity - increases risk of future cardiovascular complications   Plan:  1. Finish Bydureon '2mg'$  weekly inj for remaining 2 month supply, then he will notify me and I can switch him to [facility] 2.'5mg'$  weekly inj x 4 weeks then dose increase monthly until appropriate dose approx 5-'10mg'$  range - Continue Metformin '1000mg'$  BID - future may discontinue if controlled on GLP/GIP therapy Goal w/ wt loss to reduce meds 2. Encourage improved lifestyle - low carb, low sugar diet, reduce portion size, continue improving regular exercise 3. Check CBG , bring log to next visit for review  Need DM Eye exam

## 2021-12-18 NOTE — Assessment & Plan Note (Signed)
BMI >39 with risk factors comorbid type 2 diabetes, and hyperlipidemia, meet criteria for morbid obesity dx  Encourage wt loss and lifestyle, on GLP1

## 2021-12-18 NOTE — Patient Instructions (Addendum)
Thank you for coming to the office today.  Recommend Diabetic Eye Exam! Have them fax [place] the copy of the report.  Use up the Bydureon that you have, when ready for the new medicine (2 weeks remaining on your Bydureon) we can order the new Mounjaro injection to replace it. This one starts at 2.'5mg'$  weekly, 1 month each dose with increasing regimen, next is '5mg'$  then 7.'5mg'$  then 10 and so on until '15mg'$ . Should have improved appetite with reduction and weight loss.  Start new depression med Wellbutrin XL '150mg'$  daily, take once daily in morning. It can help improve mood and work with your Fluoxetine and also it can reduce appetite as well and improve weight.  Recent Labs    03/13/21 1511 06/10/21 0816 12/11/21 0754  HGBA1C 8.0* 7.8* 8.6*    These offices have both [place] and IT trainer Diplomatic Services operational officer Available) [facility] [address] [place], [place] [address] Phone: [phone]  [facility] Address: [address], [place], [place] [address] [email or web address] Phone: [phone]  [facility] [place] ([facility]) Address: [place] [address], [place], [place] [address] Hours: 8:30AM-5PM Phone: [phone]  [place],  [place]  [address] Phone: [phone] Fax: [place] [facility] [place], [place] [address] Phone: [phone]  [facility] (All ages) [address], [place], [id number] Phone: [phone] (Option 1) [email or web address]  ----------------------------------------------------------------- THERAPIST ONLY  (No Psychiatry)  [facility] [address], [place] [address] Johnnette Litter P: [place].   Address: [place],  [place], [place] [address] Hours: Open today  9AM-7PM Phone: [phone]  [facility] [address] [place] Address: [address] [place], [place], [place] [address] Phone: [phone]  DUE for FASTING BLOOD WORK (no food or drink after midnight before the lab appointment, only water or coffee without cream/sugar on the morning of)  SCHEDULE "Lab Only" visit in the morning at the clinic for lab draw in 6 MONTHS   - Make sure Lab Only appointment is at about 1 week before your next appointment, so that results will be available  For Lab Results, once available within 2-3 days of blood draw, you can can log in to MyChart online to view your results and a brief explanation. Also, we can discuss results at next follow-up visit.    Please schedule a Follow-up Appointment to: Return in about 6 months (around 06/19/2022) for 6 month fasting lab only then 1 week later Annual Physical.  If you have any other questions or concerns, please feel free to call the office or send a message through [place]. You may also schedule an earlier appointment if necessary.  Additionally, you may be receiving a survey about your experience at our office within a few days to 1 week by e-mail or mail. We value your feedback.  [name], DO [place]

## 2022-03-03 ENCOUNTER — Other Ambulatory Visit: Payer: Self-pay | Admitting: Family Medicine

## 2022-03-03 DIAGNOSIS — K219 Gastro-esophageal reflux disease without esophagitis: Secondary | ICD-10-CM

## 2022-03-03 NOTE — Telephone Encounter (Signed)
Requested Prescriptions  Pending Prescriptions Disp Refills  . omeprazole (PRILOSEC) 40 MG capsule [Pharmacy Med Name: OMEPRAZOLE DR 40 MG CAPSULE] 180 capsule 0    Sig: TAKE 1 CAPSULE (40 MG TOTAL) BY MOUTH 2 (TWO) TIMES DAILY BEFORE A MEAL.     Gastroenterology: Proton Pump Inhibitors Passed - 03/03/2022  2:11 AM      Passed - Valid encounter within last 12 months    Recent Outpatient Visits          2 months ago Major depressive disorder, recurrent, moderate ([place])   [place], DO   3 months ago SK (seborrheic keratosis)   [place], DO   8 months ago Annual physical exam   [facility] Olin Hauser, DO   11 months ago Heat intolerance   [facility] [place], [place], NP   1 year ago Type 2 diabetes mellitus with other specified complication, without long-term current use of insulin [facility])   [facility] Olin Hauser, DO      Future Appointments            In 2 months Ralene Bathe, MD [place]   In 3 months [place], Devonne Doughty, DO [facility], [facility]

## 2022-03-30 ENCOUNTER — Telehealth: Payer: Self-pay | Admitting: Family Medicine

## 2022-03-30 NOTE — Telephone Encounter (Signed)
Pt called in to be advised by provider pt says that provider is adding a different insulin to his medications. Pt says that he was told by provider to complete the remainder of his current insulin and call in to start his new. Pt says that he is unsure of the name of the medication but would like to have it sent in to his pharmacy.    Ph[id number] Phone:  [phone] Fax:  [phone]  Pt was seen on 12/18/21

## 2022-03-31 NOTE — Telephone Encounter (Signed)
Patient was advised to notify us when he finished Bydureon GLP1 weekly injectable medication he had at home.  I plan to switch him to [facility] weekly injection (not insulin)  Could you please check if we have any Mounjaro starter samples available? The 4 pens 2.'5mg'$  weekly dose for 4 week?  I would recommend that he pick up a Mounjaro sample from our office first and complete the first 4 weeks, then notify me after he uses the 3rd or 4th pen and I will order the next dose up Mounjaro '5mg'$  weekly and [facility].  If we are out of samples, please let me know, or we may need to contact [employer] rep to get more.  Shawn Gordon, [facility] 03/31/2022, 9:44 AM

## 2022-04-07 NOTE — Telephone Encounter (Signed)
Message left for patient. I explained Dr. Marthann Schiller message and that he could come pick up a sample of Mounjaro.

## 2022-04-14 NOTE — Telephone Encounter (Signed)
Pt called and said he never got this message but would like to come by Friday morning and get the sample.  He works during th night and sleeps during the day so he is only available at certain times.  CB@  [phone]

## 2022-04-15 NOTE — Telephone Encounter (Signed)
Called to let the patient know that he can come pick up a sample of Mounjaro on Friday at his convenience. I was sent to VM which was full so I could not leave a message.

## 2022-05-14 ENCOUNTER — Ambulatory Visit: Payer: Managed Care, Other (non HMO) | Admitting: Dermatology

## 2022-05-14 DIAGNOSIS — D229 Melanocytic nevi, unspecified: Secondary | ICD-10-CM | POA: Diagnosis not present

## 2022-05-14 DIAGNOSIS — L578 Other skin changes due to chronic exposure to nonionizing radiation: Secondary | ICD-10-CM | POA: Diagnosis not present

## 2022-05-14 DIAGNOSIS — Z1283 Encounter for screening for malignant neoplasm of skin: Secondary | ICD-10-CM

## 2022-05-14 DIAGNOSIS — L821 Other seborrheic keratosis: Secondary | ICD-10-CM

## 2022-05-14 DIAGNOSIS — L820 Inflamed seborrheic keratosis: Secondary | ICD-10-CM | POA: Diagnosis not present

## 2022-05-14 DIAGNOSIS — L814 Other melanin hyperpigmentation: Secondary | ICD-10-CM

## 2022-05-14 NOTE — Progress Notes (Deleted)
   New Patient Visit  Subjective  Shawn Gordon is a 61 y.o. male who presents for the following: Skin Problem (The patient has spots, moles and lesions to be evaluated, some may be new or changing and the patient has concerns that these could be cancer. ).    The following portions of the chart were reviewed this encounter and updated as appropriate:       Review of Systems:  No other skin or systemic complaints except as noted in HPI or Assessment and Plan.  Objective  Well appearing patient in no apparent distress; mood and affect are within normal limits.  All skin waist up examined.   Assessment & Plan   No follow-ups on file.   IMarye Round, CMA, am acting as scribe for Sarina Ser, MD .

## 2022-05-14 NOTE — Patient Instructions (Addendum)
Seborrheic Keratosis  What causes seborrheic keratoses? Seborrheic keratoses are harmless, common skin growths that first appear during adult life.  As time goes by, more growths appear.  Some people may develop a large number of them.  Seborrheic keratoses appear on both covered and uncovered body parts.  They are not caused by sunlight.  The tendency to develop seborrheic keratoses can be inherited.  They vary in color from skin-colored to gray, brown, or even black.  They can be either smooth or have a rough, warty surface.   Seborrheic keratoses are superficial and look as if they were stuck on the skin.  Under the microscope this type of keratosis looks like layers upon layers of skin.  That is why at times the top layer may seem to fall off, but the rest of the growth remains and re-grows.    Treatment Seborrheic keratoses do not need to be treated, but can easily be removed in the office.  Seborrheic keratoses often cause symptoms when they rub on clothing or jewelry.  Lesions can be in the way of shaving.  If they become inflamed, they can cause itching, soreness, or burning.  Removal of a seborrheic keratosis can be accomplished by freezing, burning, or surgery. If any spot bleeds, scabs, or grows rapidly, please return to have it checked, as these can be an indication of a skin cancer.  Cryotherapy Aftercare  Wash gently with soap and water everyday.   Apply Vaseline and Band-Aid daily until healed.    Due to recent changes in healthcare laws, you may see results of your pathology and/or laboratory studies on MyChart before the doctors have had a chance to review them. We understand that in some cases there may be results that are confusing or concerning to you. Please understand that not all results are received at the same time and often the doctors may need to interpret multiple results in order to provide you with the best plan of care or course of treatment. Therefore, we ask that you  please give us 2 business days to thoroughly review all your results before contacting the office for clarification. Should we see a critical lab result, you will be contacted sooner.   If You Need Anything After Your Visit  If you have any questions or concerns for your doctor, please call our main line at [phone] and press option 4 to reach your doctor's medical assistant. If no one answers, please leave a voicemail as directed and we will return your call as soon as possible. Messages left after 4 pm will be answered the following business day.   You may also send us a message via MyChart. We typically respond to MyChart messages within 1-2 business days.  For prescription refills, please ask your pharmacy to contact our office. Our fax number is [phone].  If you have an urgent issue when the clinic is closed that cannot wait until the next business day, you can page your doctor at the number below.    Please note that while we do our best to be available for urgent issues outside of office hours, we are not available 24/7.   If you have an urgent issue and are unable to reach us, you may choose to seek medical care at your doctor's office, retail clinic, urgent care center, or emergency room.  If you have a medical emergency, please immediately call 911 or go to the emergency department.  Pager Numbers  - Dr. Kowalski: [phone]  -   Dr. Moye: [phone]  - Dr. Stewart: [phone]  In the event of inclement weather, please call our main line at [phone] for an update on the status of any delays or closures.  Dermatology Medication Tips: Please keep the boxes that topical medications come in in order to help keep track of the instructions about where and how to use these. Pharmacies typically print the medication instructions only on the boxes and not directly on the medication tubes.   If your medication is too expensive, please contact our office at  [phone] option 4 or send us a message through MyChart.   We are unable to tell what your co-pay for medications will be in advance as this is different depending on your insurance coverage. However, we may be able to find a substitute medication at lower cost or fill out paperwork to get insurance to cover a needed medication.   If a prior authorization is required to get your medication covered by your insurance company, please allow us 1-2 business days to complete this process.  Drug prices often vary depending on where the prescription is filled and some pharmacies may offer cheaper prices.  The website www.goodrx.com contains coupons for medications through different pharmacies. The prices here do not account for what the cost may be with help from insurance (it may be cheaper with your insurance), but the website can give you the price if you did not use any insurance.  - You can print the associated coupon and take it with your prescription to the pharmacy.  - You may also stop by our office during regular business hours and pick up a GoodRx coupon card.  - If you need your prescription sent electronically to a different pharmacy, notify our office through [place] MyChart or by phone at [phone] option 4.     Si Usted Necesita Algo Despus de Su Visita  Tambin puede enviarnos un mensaje a travs de MyChart. Por lo general respondemos a los mensajes de MyChart en el transcurso de 1 a 2 das hbiles.  Para renovar recetas, por favor pida a su farmacia que se ponga en contacto con nuestra oficina. Nuestro nmero de fax es el [phone].  Si tiene un asunto urgente cuando la clnica est cerrada y que no puede esperar hasta el siguiente da hbil, puede llamar/localizar a su doctor(a) al nmero que aparece a continuacin.   Por favor, tenga en cuenta que aunque hacemos todo lo posible para estar disponibles para asuntos urgentes fuera del horario de oficina, no estamos  disponibles las 24 horas del da, los 7 das de la semana.   Si tiene un problema urgente y no puede comunicarse con nosotros, puede optar por buscar atencin mdica  en el consultorio de su doctor(a), en una clnica privada, en un centro de atencin urgente o en una sala de emergencias.  Si tiene una emergencia mdica, por favor llame inmediatamente al 911 o vaya a la sala de emergencias.  Nmeros de bper  - Dr. Kowalski: [phone]  - Dra. Moye: [phone]  - Dra. Stewart: [phone]  En caso de inclemencias del tiempo, por favor llame a nuestra lnea principal al [phone] para una actualizacin sobre el estado de cualquier retraso o cierre.  Consejos para la medicacin en dermatologa: Por favor, guarde las cajas en las que vienen los medicamentos de uso tpico para ayudarle a seguir las instrucciones sobre dnde y cmo usarlos. Las farmacias generalmente imprimen las instrucciones del medicamento slo en las cajas y   no directamente en los tubos del medicamento.   Si su medicamento es muy caro, por favor, pngase en contacto con nuestra oficina llamando al [phone] y presione la opcin 4 o envenos un mensaje a travs de MyChart.   No podemos decirle cul ser su copago por los medicamentos por adelantado ya que esto es diferente dependiendo de la cobertura de su seguro. Sin embargo, es posible que podamos encontrar un medicamento sustituto a menor costo o llenar un formulario para que el seguro cubra el medicamento que se considera necesario.   Si se requiere una autorizacin previa para que su compaa de seguros cubra su medicamento, por favor permtanos de 1 a 2 das hbiles para completar este proceso.  Los precios de los medicamentos varan con frecuencia dependiendo del lugar de dnde se surte la receta y alguna farmacias pueden ofrecer precios ms baratos.  El sitio web www.goodrx.com tiene cupones para medicamentos de diferentes farmacias. Los precios aqu no  tienen en cuenta lo que podra costar con la ayuda del seguro (puede ser ms barato con su seguro), pero el sitio web puede darle el precio si no utiliz ningn seguro.  - Puede imprimir el cupn correspondiente y llevarlo con su receta a la farmacia.  - Tambin puede pasar por nuestra oficina durante el horario de atencin regular y recoger una tarjeta de cupones de GoodRx.  - Si necesita que su receta se enve electrnicamente a una farmacia diferente, informe a nuestra oficina a travs de MyChart de  o por telfono llamando al [phone] y presione la opcin 4.  

## 2022-05-14 NOTE — Progress Notes (Signed)
   New Patient Visit  Subjective  Shawn Gordon is a 61 y.o. male who presents for the following: Skin Problem  The patient presents for Upper Body Skin Exam (UBSE) for skin cancer screening and mole check.  The patient has spots, moles and lesions to be evaluated, some may be new or changing and the patient has concerns that these could be cancer.   The following portions of the chart were reviewed this encounter and updated as appropriate:   Tobacco  Allergies  Meds  Problems  Med Hx  Surg Hx  Fam Hx     Review of Systems:  No other skin or systemic complaints except as noted in HPI or Assessment and Plan.  Objective  Well appearing patient in no apparent distress; mood and affect are within normal limits.  All skin waist up examined.  right mid back lateral x 2. right lower leg x 2  (4) (4) Stuck-on, waxy, tan-brown papules --Discussed benign etiology and prognosis.    Assessment & Plan  Inflamed seborrheic keratosis (4) right mid back lateral x 2. right lower leg x 2  (4) Symptomatic, irritating, patient would like treated. Destruction of lesion - right mid back lateral x 2. right lower leg x 2  (4) Complexity: simple   Destruction method: cryotherapy   Informed consent: discussed and consent obtained   Timeout:  patient name, date of birth, surgical site, and procedure verified Lesion destroyed using liquid nitrogen: Yes   Region frozen until ice ball extended beyond lesion: Yes   Outcome: patient tolerated procedure well with no complications   Post-procedure details: wound care instructions given    Lentigines - Scattered tan macules - Due to sun exposure - Benign-appearing, observe - Recommend daily broad spectrum sunscreen SPF 30+ to sun-exposed areas, reapply every 2 hours as needed. - Call for any changes  Seborrheic Keratoses - Stuck-on, waxy, tan-brown papules and/or plaques  - Benign-appearing - Discussed benign etiology and prognosis. -  Observe - Call for any changes  Melanocytic Nevi - Tan-brown and/or pink-flesh-colored symmetric macules and papules - Benign appearing on exam today - Observation - Call clinic for new or changing moles - Recommend daily use of broad spectrum spf 30+ sunscreen to sun-exposed areas.   Hemangiomas - Red papules - Discussed benign nature - Observe - Call for any changes  Actinic Damage - Chronic condition, secondary to cumulative UV/sun exposure - diffuse scaly erythematous macules with underlying dyspigmentation - Recommend daily broad spectrum sunscreen SPF 30+ to sun-exposed areas, reapply every 2 hours as needed.  - Staying in the shade or wearing long sleeves, sun glasses (UVA+UVB protection) and wide brim hats (4-inch brim around the entire circumference of the hat) are also recommended for sun protection.  - Call for new or changing lesions.  Skin cancer screening performed today.   Return if symptoms worsen or fail to improve.  IMarye Round, CMA, am acting as scribe for Sarina Ser, MD .  Documentation: I have reviewed the above documentation for accuracy and completeness, and I agree with the above.  Sarina Ser, MD

## 2022-05-15 ENCOUNTER — Telehealth: Payer: Self-pay | Admitting: Family Medicine

## 2022-05-15 DIAGNOSIS — E1169 Type 2 diabetes mellitus with other specified complication: Secondary | ICD-10-CM

## 2022-05-15 MED ORDER — MOUNJARO 5 MG/0.5ML ~~LOC~~ SOAJ: 5.0000 mg | SUBCUTANEOUS | 1 refills | Status: DC

## 2022-05-15 NOTE — Telephone Encounter (Signed)
Please let him know that I have just sent rx Mounjaro '5mg'$  weekly inj [facility] day supply [facility]  It should be covered/preferred.  If any issues getting medicine once it is approved, he can let us know.  [name], [facility] 05/15/2022, 3:06 PM

## 2022-05-15 NOTE — Telephone Encounter (Signed)
Patient called in, only had samples of monjaro but he now wants this as a prescription

## 2022-05-19 ENCOUNTER — Encounter: Payer: Self-pay | Admitting: Dermatology

## 2022-06-03 ENCOUNTER — Other Ambulatory Visit: Payer: Self-pay | Admitting: Family Medicine

## 2022-06-03 DIAGNOSIS — K219 Gastro-esophageal reflux disease without esophagitis: Secondary | ICD-10-CM

## 2022-06-03 NOTE — Telephone Encounter (Signed)
Requested Prescriptions  Pending Prescriptions Disp Refills   omeprazole (PRILOSEC) 40 MG capsule [Pharmacy Med Name: OMEPRAZOLE DR 40 MG CAPSULE] 180 capsule 0    Sig: TAKE 1 CAPSULE (40 MG TOTAL) BY MOUTH 2 (TWO) TIMES DAILY BEFORE A MEAL.     Gastroenterology: Proton Pump Inhibitors Passed - 06/03/2022  1:57 AM      Passed - Valid encounter within last 12 months    Recent Outpatient Visits           5 months ago Major depressive disorder, recurrent, moderate ([place])   [place], DO   6 months ago SK (seborrheic keratosis)   [facility] Olin Hauser, DO   11 months ago Annual physical exam   [facility] Olin Hauser, DO   1 year ago Heat intolerance   [facility] [place], [place], [place]   1 year ago Type 2 diabetes mellitus with other specified complication, without long-term current use of insulin ([place])   [facility] [place], Devonne Doughty, DO       Future Appointments             In 2 weeks [place], Devonne Doughty, DO [facility]

## 2022-06-10 ENCOUNTER — Other Ambulatory Visit: Payer: Self-pay

## 2022-06-10 DIAGNOSIS — E1169 Type 2 diabetes mellitus with other specified complication: Secondary | ICD-10-CM

## 2022-06-10 DIAGNOSIS — N401 Enlarged prostate with lower urinary tract symptoms: Secondary | ICD-10-CM

## 2022-06-10 DIAGNOSIS — F331 Major depressive disorder, recurrent, moderate: Secondary | ICD-10-CM

## 2022-06-10 DIAGNOSIS — Z0000 Encounter for general adult medical examination without abnormal findings: Secondary | ICD-10-CM

## 2022-06-15 ENCOUNTER — Other Ambulatory Visit: Payer: Managed Care, Other (non HMO)

## 2022-06-16 LAB — COMPLETE METABOLIC PANEL WITH GFR
AG Ratio: 1.2 (calc) (ref 1.0–2.5)
ALT: 38 U/L (ref 9–46)
AST: 29 U/L (ref 10–35)
Albumin: 4.2 g/dL (ref 3.6–5.1)
Alkaline phosphatase (APISO): 78 U/L (ref 35–144)
BUN: 12 mg/dL (ref 7–25)
CO2: 25 mmol/L (ref 20–32)
Calcium: 9.8 mg/dL (ref 8.6–10.3)
Chloride: 100 mmol/L (ref 98–110)
Creat: 0.77 mg/dL (ref 0.70–1.35)
Globulin: 3.4 g/dL (calc) (ref 1.9–3.7)
Glucose, Bld: 192 mg/dL — ABNORMAL HIGH (ref 65–99)
Potassium: 4.6 mmol/L (ref 3.5–5.3)
Sodium: 136 mmol/L (ref 135–146)
Total Bilirubin: 0.4 mg/dL (ref 0.2–1.2)
Total Protein: 7.6 g/dL (ref 6.1–8.1)
eGFR: 101 mL/min/{1.73_m2} (ref >=60)

## 2022-06-16 LAB — CBC WITH DIFFERENTIAL/PLATELET
Absolute Monocytes: 447 cells/uL (ref 200–950)
Basophils Absolute: 43 cells/uL (ref 0–200)
Basophils Relative: 0.6 %
Eosinophils Absolute: 249 cells/uL (ref 15–500)
Eosinophils Relative: 3.5 %
HCT: 38.8 % (ref 38.5–50.0)
Hemoglobin: 12.8 g/dL — ABNORMAL LOW (ref 13.2–17.1)
Lymphs Abs: 1243 cells/uL (ref 850–3900)
MCH: 27.8 pg (ref 27.0–33.0)
MCHC: 33 g/dL (ref 32.0–36.0)
MCV: 84.3 fL (ref 80.0–100.0)
MPV: 10.7 fL (ref 7.5–12.5)
Monocytes Relative: 6.3 %
Neutro Abs: 5119 cells/uL (ref 1500–7800)
Neutrophils Relative %: 72.1 %
Platelets: 338 10*3/uL (ref 140–400)
RBC: 4.6 10*6/uL (ref 4.20–5.80)
RDW: 13.3 % (ref 11.0–15.0)
Total Lymphocyte: 17.5 %
WBC: 7.1 10*3/uL (ref 3.8–10.8)

## 2022-06-16 LAB — HEMOGLOBIN A1C
Hgb A1c MFr Bld: 9.2 % of total Hgb — ABNORMAL HIGH (ref <5.7)
Mean Plasma Glucose: 217 mg/dL
eAG (mmol/L): 12 mmol/L

## 2022-06-16 LAB — LIPID PANEL
Cholesterol: 164 mg/dL (ref <200)
HDL: 43 mg/dL (ref >=40)
LDL Cholesterol (Calc): 92 mg/dL (calc)
Non-HDL Cholesterol (Calc): 121 mg/dL (calc) (ref <130)
Total CHOL/HDL Ratio: 3.8 (calc) (ref <5.0)
Triglycerides: 196 mg/dL — ABNORMAL HIGH (ref <150)

## 2022-06-16 LAB — TSH: TSH: 1.13 mIU/L (ref 0.40–4.50)

## 2022-06-16 LAB — PSA: PSA: 1.14 ng/mL (ref <=4.00)

## 2022-06-17 ENCOUNTER — Other Ambulatory Visit: Payer: Self-pay | Admitting: Family Medicine

## 2022-06-17 DIAGNOSIS — F331 Major depressive disorder, recurrent, moderate: Secondary | ICD-10-CM

## 2022-06-17 NOTE — Telephone Encounter (Signed)
Last RF 12/18/21 #90 1 RF-   Requested Prescriptions  Refused Prescriptions Disp Refills   buPROPion (WELLBUTRIN XL) 150 MG 24 hr tablet [Pharmacy Med Name: BUPROPION HCL XL 150 MG TABLET] 90 tablet 1    Sig: TAKE 1 TABLET (150 MG TOTAL) BY MOUTH DAILY. IN MORNING.     Psychiatry: Antidepressants - bupropion Failed - 06/17/2022  2:32 AM      Failed - Valid encounter within last 6 months    Recent Outpatient Visits           6 months ago Major depressive disorder, recurrent, moderate ([place])   [facility] [place], Devonne Doughty, DO   7 months ago SK (seborrheic keratosis)   [place], DO   1 year ago Annual physical exam   [facility] Olin Hauser, DO   1 year ago Heat intolerance   [facility] [place], Coralie Keens, NP   1 year ago Type 2 diabetes mellitus with other specified complication, without long-term current use of insulin ([place])   [place], DO       Future Appointments             In 5 days [place], Devonne Doughty, DO [facility] [place] in normal range and within 360 days    Creat  Date Value Ref Range Status  06/15/2022 0.77 0.70 - 1.35 mg/dL Final         Passed - AST in normal range and within 360 days    AST  Date Value Ref Range Status  06/15/2022 29 10 - 35 U/L Final         Passed - ALT in normal range and within 360 days    ALT  Date Value Ref Range Status  06/15/2022 38 9 - 46 U/L Final         Passed - Completed PHQ-2 or PHQ-9 in the last 360 days      Passed - Last BP in normal range    BP Readings from Last 1 Encounters:  12/18/21 120/74

## 2022-06-22 ENCOUNTER — Encounter: Payer: Self-pay | Admitting: Family Medicine

## 2022-06-22 ENCOUNTER — Ambulatory Visit (INDEPENDENT_AMBULATORY_CARE_PROVIDER_SITE_OTHER): Payer: Managed Care, Other (non HMO) | Admitting: Family Medicine

## 2022-06-22 VITALS — BP 138/72 | HR 79 | Ht 69.5 in | Wt 261.0 lb

## 2022-06-22 DIAGNOSIS — Z0000 Encounter for general adult medical examination without abnormal findings: Secondary | ICD-10-CM

## 2022-06-22 DIAGNOSIS — G8929 Other chronic pain: Secondary | ICD-10-CM

## 2022-06-22 DIAGNOSIS — E1169 Type 2 diabetes mellitus with other specified complication: Secondary | ICD-10-CM | POA: Diagnosis not present

## 2022-06-22 DIAGNOSIS — Z96641 Presence of right artificial hip joint: Secondary | ICD-10-CM

## 2022-06-22 DIAGNOSIS — E785 Hyperlipidemia, unspecified: Secondary | ICD-10-CM

## 2022-06-22 DIAGNOSIS — N138 Other obstructive and reflux uropathy: Secondary | ICD-10-CM

## 2022-06-22 DIAGNOSIS — N401 Enlarged prostate with lower urinary tract symptoms: Secondary | ICD-10-CM

## 2022-06-22 DIAGNOSIS — Z1211 Encounter for screening for malignant neoplasm of colon: Secondary | ICD-10-CM

## 2022-06-22 DIAGNOSIS — F3342 Major depressive disorder, recurrent, in full remission: Secondary | ICD-10-CM

## 2022-06-22 DIAGNOSIS — M25561 Pain in right knee: Secondary | ICD-10-CM

## 2022-06-22 MED ORDER — MOUNJARO 7.5 MG/0.5ML ~~LOC~~ SOAJ: 7.5000 mg | SUBCUTANEOUS | 1 refills | Status: DC

## 2022-06-22 MED ORDER — BUPROPION HCL ER (XL) 150 MG PO TB24: 150.0000 mg | ORAL_TABLET | Freq: Every day | ORAL | 3 refills | Status: DC

## 2022-06-22 MED ORDER — FLUOXETINE HCL 40 MG PO CAPS: 40.0000 mg | ORAL_CAPSULE | Freq: Every day | ORAL | 3 refills | Status: DC

## 2022-06-22 MED ORDER — METFORMIN HCL 1000 MG PO TABS: 1000.0000 mg | ORAL_TABLET | Freq: Two times a day (BID) | ORAL | 3 refills | Status: DC

## 2022-06-22 MED ORDER — ATORVASTATIN CALCIUM 10 MG PO TABS: ORAL_TABLET | ORAL | 3 refills | Status: DC

## 2022-06-22 MED ORDER — LISINOPRIL 2.5 MG PO TABS: 2.5000 mg | ORAL_TABLET | Freq: Every day | ORAL | 3 refills | Status: DC

## 2022-06-22 MED ORDER — TAMSULOSIN HCL 0.4 MG PO CAPS: 0.4000 mg | ORAL_CAPSULE | Freq: Every day | ORAL | 3 refills | Status: DC

## 2022-06-22 NOTE — Progress Notes (Signed)
Subjective:    Patient ID: Shawn Gordon, male    DOB: 1961/01/05, 61 y.o.   MRN: [id number]  Shawn Gordon is a 61 y.o. male presenting on 06/22/2022 for Annual Exam   HPI  Here for Annual Physical and Lab Review.  CHRONIC DM, Type 2, Morbid Obesity  Interval Update with A1c elevated again up to 9.2 He drinks 1 coffee in AM, now drinking more hot chocolate lately and lactaid milk often but it is sugar free. No other changes to explain  CBGs: Rare low sugar hypoglycemia only due to not eating and still working outside triggered, had some hyperglycemia Meds: Metformin 1052m BID, Mounjaro 561mweekly Reports good compliance. Tolerating well w/o side-effects Currently on ACEi Lifestyle: Improved diet overall has reduced some things in diet overall. Walking more Admits R toe tingling if in wrong position - has improved some not having other areas of numbness tingling or pain. Present there for few years. Does not affect him walking and gait. Denies hypoglycemia, polyuria, visual changes, numbness.   HYPERLIPIDEMIA: - Reports no concerns. Last lipid panel 05/2022, controlled  - Currently taking Atorvastatin 1031mtolerating well without side effects or myalgias Lifestyle - Diet: improving limited cholesterol intake.   Major Depression, recurrent chronic  Chronic problem See PHQ On Fluoxetine 14m74milyi, Wellbutrin XL 150mg14mly   GERD On PPI Omeprazole 14mg 6m    BPH Doing well without concern. On Flomax 0.4mg da67m.     Additional topics  Low Back Pain Osteoarthritis, multiple joints Right Knee Pain If standing he has low back pain, prior imaging confirms DJD L4-S1 range, DDD Pain worse if standing still Not taking OTC medications. Has Tylenol and Mobic but not taking mobic, he prefers to avoid med He walks a lot at work He had prior bilateral hip surgery repair, 2007 and 2008 Admits issues with knees from prior hip surgery   Past Surgical History:   Procedure Laterality Date   SHOULDER ARTHROSCOPY WITH SUBACROMIAL DECOMPRESSION AND OPEN ROTATOR C Right 05/17/2020   Procedure: Right shoulder arthroscopic subscapularis repair, mini-open supraspinatus repair, subacromial decompression, distal clavicle excision, and open biceps tenodesis;  Surgeon: Patel, Leim FabryLocation[facility] [place];  Laterality: Right;   SHOULDER ARTHROSCOPY WITH SUBACROMIAL DECOMPRESSION, ROTATOR CUFF REPAIR AND BICEP TENDON REPAIR Left 09/01/2019   Procedure: SHOULDER ARTHROSCOPY WITH SUBACROMIAL DECOMPRESSION, ROTATOR CUFF REPAIR AND BICEP TENDON REPAIR, SUBSACAPULAIRS REPAIR, DISTAL CLAVICLE EXCISION;  Surgeon: Patel, Leim FabryLocation: [facility] [place];  Laterality: Left;  Diabetic - oral and injectable meds [name] ASSISTING   TOTAL HIP ARTHROPLASTY Bilateral 2007   Left - 2007, right - 2008     Health Maintenance:  Due for Colon CA Screening - last Cologuard 2020 negative, needs new order.  PSA Prostate Cancer Screening - 1.14 (05/2022) previously 1.10       06/22/2022    9:28 AM 12/18/2021    8:38 AM 11/11/2021    3:09 PM  Depression screen PHQ 2/9  Decreased Interest _0 Down, Depressed, Hopeless _1 PHQ - 2 Score _2 Altered sleeping 0 2 0  Tired, decreased energy _3 Change in appetite 0 3 2  Feeling bad or failure about yourself  0 3 1  Trouble concentrating _4 Moving slowly or fidgety/restless 0 0 0  Suicidal thoughts 0 0 0  PHQ-9 Score 5 20 8  Difficult doing work/chores Very difficult Extremely dIfficult Not difficult at all    Past Medical History:  Diagnosis Date   Arthritis    Lower spine, wrist, hips(pre-replacement), shoulders   Asthma    Back pain    Diabetes mellitus, type 2 (HCC)    GERD (gastroesophageal reflux disease)    Presence of dental prosthetic device    2 implants.  1 top, 1 bottom   Past Surgical History:  Procedure Laterality Date    SHOULDER ARTHROSCOPY WITH SUBACROMIAL DECOMPRESSION AND OPEN ROTATOR C Right 05/17/2020   Procedure: Right shoulder arthroscopic subscapularis repair, mini-open supraspinatus repair, subacromial decompression, distal clavicle excision, and open biceps tenodesis;  Surgeon: Leim Fabry, MD;  Location: [place];  Service: Orthopedics;  Laterality: Right;   SHOULDER ARTHROSCOPY WITH SUBACROMIAL DECOMPRESSION, ROTATOR CUFF REPAIR AND BICEP TENDON REPAIR Left 09/01/2019   Procedure: SHOULDER ARTHROSCOPY WITH SUBACROMIAL DECOMPRESSION, ROTATOR CUFF REPAIR AND BICEP TENDON REPAIR, SUBSACAPULAIRS REPAIR, DISTAL CLAVICLE EXCISION;  Surgeon: Leim Fabry, MD;  Location: [facility];  Service: Orthopedics;  Laterality: Left;  Diabetic - oral and injectable meds [name] ASSISTING   TOTAL HIP ARTHROPLASTY Bilateral 2007   Left - 2007, right - 2008   Social History   Socioeconomic History   Marital status: Single    Spouse name: Not on file   Number of children: Not on file   Years of education: Not on file   Highest education level: Not on file  Occupational History   Occupation: Product/process development scientist    Comment: Night-shift  Tobacco Use   Smoking status: Former    Packs/day: 2.00    Types: Cigarettes    Start date: 04/19/1978    Quit date: 02/26/2001    Years since quitting: 21.3   Smokeless tobacco: Former  Scientific laboratory technician Use: Never used  Substance and Sexual Activity   Alcohol use: No    Alcohol/week: 0.0 standard drinks of alcohol   Drug use: No   Sexual activity: Not on file  Other Topics Concern   Not on file  Social History Narrative   Not on file   Social Determinants of Health   Financial Resource Strain: Not on file  Food Insecurity: Not on file  Transportation Needs: Not on file  Physical Activity: Not on file  Stress: Not on file  Social Connections: Not on file  Intimate Partner Violence: Not on file   Family History  Problem Relation Age of  Onset   Stroke Mother    Stroke Father    Cancer Father    Hypertension Paternal Grandfather    Current Outpatient Medications on File Prior to Visit  Medication Sig   B Complex Vitamins (B COMPLEX PO) Take by mouth daily.   Cholecalciferol (VITAMIN D3 PO) Take by mouth daily.   glucose blood (TRUETEST TEST) test strip Check Blood Sugar 1-2 Times daily.   omeprazole (PRILOSEC) 40 MG capsule TAKE 1 CAPSULE (40 MG TOTAL) BY MOUTH 2 (TWO) TIMES DAILY BEFORE A MEAL.   loratadine (CLARITIN) 10 MG tablet Take 10 mg by mouth daily as needed.  (Patient not taking: Reported on 03/13/2021)   No current facility-administered medications on file prior to visit.    Review of Systems  Constitutional:  Negative for activity change, appetite change, chills, diaphoresis, fatigue and fever.  HENT:  Negative for congestion and hearing loss.   Eyes:  Negative for visual disturbance.  Respiratory:  Negative for cough, chest tightness, shortness of breath  and wheezing.   Cardiovascular:  Negative for chest pain, palpitations and leg swelling.  Gastrointestinal:  Negative for abdominal pain, constipation, diarrhea, nausea and vomiting.  Genitourinary:  Negative for dysuria, frequency and hematuria.  Musculoskeletal:  Negative for arthralgias and neck pain.  Skin:  Negative for rash.  Neurological:  Negative for dizziness, weakness, light-headedness, numbness and headaches.  Hematological:  Negative for adenopathy.  Psychiatric/Behavioral:  Negative for behavioral problems, dysphoric mood and sleep disturbance.    Per HPI unless specifically indicated above     Objective:    BP 138/72 (BP Location: Left Arm, Cuff Size: Normal)   Pulse 79   Ht 5' 9.5" (1.765 m)   Wt 261 lb (118.4 kg)   SpO2 (!) 89%   BMI 37.99 kg/m   Wt Readings from Last 3 Encounters:  06/22/22 261 lb (118.4 kg)  12/18/21 268 lb 3.2 oz (121.7 kg)  11/11/21 269 lb (122 kg)    Physical Exam Vitals and nursing note reviewed.   Constitutional:      General: He is not in acute distress.    Appearance: He is well-developed. He is not diaphoretic.     Comments: Well-appearing, comfortable, cooperative  HENT:     Head: Normocephalic and atraumatic.  Eyes:     General:        Right eye: No discharge.        Left eye: No discharge.     Conjunctiva/sclera: Conjunctivae normal.     Pupils: Pupils are equal, round, and reactive to light.  Neck:     Thyroid: No thyromegaly.     Vascular: No carotid bruit.  Cardiovascular:     Rate and Rhythm: Normal rate and regular rhythm.     Pulses: Normal pulses.     Heart sounds: Normal heart sounds. No murmur heard. Pulmonary:     Effort: Pulmonary effort is normal. No respiratory distress.     Breath sounds: Normal breath sounds. No wheezing or rales.  Abdominal:     General: Bowel sounds are normal. There is no distension.     Palpations: Abdomen is soft. There is no mass.     Tenderness: There is no abdominal tenderness.  Musculoskeletal:        General: No tenderness. Normal range of motion.     Cervical back: Normal range of motion and neck supple.     Right lower leg: No edema.     Left lower leg: No edema.     Comments: Upper / Lower Extremities: - Normal muscle tone, strength bilateral upper extremities 5/5, lower extremities 5/5  Lymphadenopathy:     Cervical: No cervical adenopathy.  Skin:    General: Skin is warm and dry.     Findings: No erythema or rash.  Neurological:     Mental Status: He is alert and oriented to person, place, and time.     Comments: Distal sensation intact to light touch all extremities  Psychiatric:        Mood and Affect: Mood normal.        Behavior: Behavior normal.        Thought Content: Thought content normal.     Comments: Well groomed, good eye contact, normal speech and thoughts     Diabetic Foot Exam - Simple   Simple Foot Form Diabetic Foot exam was performed with the following findings: Yes 06/22/2022  9:58 AM   Visual Inspection See comments: Yes Sensation Testing Intact to touch and monofilament testing bilaterally:  Yes Pulse Check Posterior Tibialis and Dorsalis pulse intact bilaterally: Yes Comments Bilateral feet with great toe deformity, some discoloration whitening of R 3rd toe. Varicose veins of ankles. Dry skin callus formation. No erythema or ulceration.    I have personally reviewed the radiology report from 03/13/21.  Narrative & Impression  CLINICAL DATA:  Chronic low back pain   EXAM: LUMBAR SPINE - COMPLETE 4+ VIEW   COMPARISON:  None.   FINDINGS: Lower thoracic and lumbar spine multilevel degenerative changes, most pronounced at L5-S1 where there is marked disc space narrowing, sclerosis and anterior osteophytes. Facet arthropathy most severe at L4-5 and L5-S1 with marked sclerosis and bony hypertrophy.   No visualized pars defects. No acute compression fracture, wedge-shaped deformity or focal kyphosis. Normal SI joints for age. Partial imaging of the hip replacements bilaterally.   Nonobstructive bowel gas pattern.  Aorta atherosclerotic.   IMPRESSION: Lower thoracic and lumbar degenerative changes as above. No acute finding by plain radiography   Aortic Atherosclerosis (ICD10-I70.0).     Electronically Signed   By: Jerilynn Mages.  Shick M.D.   On: 03/14/2021 12:07     Results for orders placed or performed in visit on 06/10/22  TSH  Result Value Ref Range   TSH 1.13 0.40 - 4.50 mIU/L  PSA  Result Value Ref Range   PSA 1.14 < OR = 4.00 ng/mL  Hemoglobin A1c  Result Value Ref Range   Hgb A1c MFr Bld 9.2 (H) <5.7 % of total Hgb   Mean Plasma Glucose 217 mg/dL   eAG (mmol/L) 12.0 mmol/L  Lipid panel  Result Value Ref Range   Cholesterol 164 <200 mg/dL   HDL 43 > OR = 40 mg/dL   Triglycerides 196 (H) <150 mg/dL   LDL Cholesterol (Calc) 92 mg/dL (calc)   Total CHOL/HDL Ratio 3.8 <5.0 (calc)   Non-HDL Cholesterol (Calc) 121 <130 mg/dL (calc)  CBC with  Differential/Platelet  Result Value Ref Range   WBC 7.1 3.8 - 10.8 Thousand/uL   RBC 4.60 4.20 - 5.80 Million/uL   Hemoglobin 12.8 (L) 13.2 - 17.1 g/dL   HCT 38.8 38.5 - 50.0 %   MCV 84.3 80.0 - 100.0 fL   MCH 27.8 27.0 - 33.0 pg   MCHC 33.0 32.0 - 36.0 g/dL   RDW 13.3 11.0 - 15.0 %   Platelets 338 140 - 400 Thousand/uL   MPV 10.7 7.5 - 12.5 fL   Neutro Abs 5,119 1,500 - 7,800 cells/uL   Lymphs Abs 1,243 850 - 3,900 cells/uL   Absolute Monocytes 447 200 - 950 cells/uL   Eosinophils Absolute 249 15 - 500 cells/uL   Basophils Absolute 43 0 - 200 cells/uL   Neutrophils Relative % 72.1 %   Total Lymphocyte 17.5 %   Monocytes Relative 6.3 %   Eosinophils Relative 3.5 %   Basophils Relative 0.6 %  COMPLETE METABOLIC PANEL WITH GFR  Result Value Ref Range   Glucose, Bld 192 (H) 65 - 99 mg/dL   BUN 12 7 - 25 mg/dL   Creat 0.77 0.70 - 1.35 mg/dL   eGFR 101 > OR = 60 mL/min/1.86m   BUN/Creatinine Ratio SEE NOTE: 6 - 22 (calc)   Sodium 136 135 - 146 mmol/L   Potassium 4.6 3.5 - 5.3 mmol/L   Chloride 100 98 - 110 mmol/L   CO2 25 20 - 32 mmol/L   Calcium 9.8 8.6 - 10.3 mg/dL   Total Protein 7.6 6.1 - 8.1 g/dL   Albumin  4.2 3.6 - 5.1 g/dL   Globulin 3.4 1.9 - 3.7 g/dL (calc)   AG Ratio 1.2 1.0 - 2.5 (calc)   Total Bilirubin 0.4 0.2 - 1.2 mg/dL   Alkaline phosphatase (APISO) 78 35 - 144 U/L   AST 29 10 - 35 U/L   ALT 38 9 - 46 U/L      Assessment & Plan:   Problem List Items Addressed This Visit     BPH with obstruction/lower urinary tract symptoms    BPH Improved on Tamsulosin but still has urinary symptoms  Stable PSA range      Relevant Medications   tamsulosin (FLOMAX) 0.4 MG CAPS capsule   Hyperlipidemia associated with type 2 diabetes mellitus ([place])    Controlled cholesterol on statin and lifestyle Last lipid panel 05/2022   Plan: 1. Continue current meds - Atorvastatin 77m 2. Encourage improved lifestyle - low carb/cholesterol, reduce portion size, continue  improving regular exercise      Relevant Medications   atorvastatin (LIPITOR) 10 MG tablet   lisinopril (ZESTRIL) 2.5 MG tablet   metFORMIN (GLUCOPHAGE) 1000 MG tablet   MOUNJARO 7.5 MG/0.5ML Pen   Morbid obesity (HCC)    BMI >37 with risk factors comorbid type 2 diabetes, and hyperlipidemia, meet criteria for morbid obesity dx  Encourage wt loss and lifestyle, on GLP1      Relevant Medications   metFORMIN (GLUCOPHAGE) 1000 MG tablet   MOUNJARO 7.5 MG/0.5ML Pen   Type 2 diabetes mellitus with other specified complication (HCC)    Still uncontrolled DIABETES with A1c 9.2 Prior 8 range, now on Mounjaro seems had been doing better and wt loss but unsure why A1c not improved Identified some dietary issues still Complications - other including hyperlipidemia, GERD, obesity - increases risk of future cardiovascular complications  OFF Bydureon  Plan:  1. Dose increase Mounjaro from 51mup to 7.54m254meekly - Continue Metformin 1000m1054mD - future may discontinue if controlled on GLP/GIP therapy Goal w/ wt loss to reduce meds 2. Encourage improved lifestyle - low carb, low sugar diet, reduce portion size, continue improving regular exercise 3. Check CBG , bring log to next visit for review  Urine microalbumin Need DM Eye exam      Relevant Medications   atorvastatin (LIPITOR) 10 MG tablet   lisinopril (ZESTRIL) 2.5 MG tablet   metFORMIN (GLUCOPHAGE) 1000 MG tablet   MOUNJARO 7.5 MG/0.5ML Pen   Other Relevant Orders   Urine Microalbumin w/creat. ratio   Other Visit Diagnoses     Annual physical exam    -  Primary   Major depressive disorder, recurrent, in full remission (HCC)[place]    Relevant Medications   buPROPion (WELLBUTRIN XL) 150 MG 24 hr tablet   FLUoxetine (PROZAC) 40 MG capsule   Screen for colon cancer       Relevant Orders   Cologuard   S/P hip replacement, right       Relevant Orders   DG Hip Unilat W OR W/O Pelvis 2-3 Views Right   Chronic pain of right knee        Relevant Orders   DG Knee Complete 4 Views Right       Updated Health Maintenance information Reviewed recent lab results with patient Encouraged improvement to lifestyle with diet and exercise Goal of weight loss  For OA/DJD Knees, Hip / Back He can start with some X-rays here in future and we can follow-up, if indicated can assist with medication  options. For now focus on Tylenol and conservative therapy, can re order mobic in future If interested would refer to Ortho if/when ready to pursue further assistance with knee may need further advanced imaging  Orders Placed This Encounter  Procedures   DG Hip Unilat W OR W/O Pelvis 2-3 Views Right    Standing Status:   Future    Standing Expiration Date:   06/23/2023    Order Specific Question:   Reason for Exam (SYMPTOM  OR DIAGNOSIS REQUIRED)    Answer:   right hip and knee pain, prior R hip replacement 2008, check alignment and position    Order Specific Question:   Preferred imaging location?    Answer:   [facility] Knee Complete 4 Views Right    Standing Status:   Future    Standing Expiration Date:   06/23/2023    Order Specific Question:   Reason for Exam (SYMPTOM  OR DIAGNOSIS REQUIRED)    Answer:   Chronic Right knee pain, osteoarthritis    Order Specific Question:   Preferred imaging location?    Answer:   [facility]   Urine Microalbumin w/creat. ratio   Cologuard      Meds ordered this encounter  Medications   atorvastatin (LIPITOR) 10 MG tablet    Sig: TAKE 1 TABLET BY MOUTH EVERYDAY AT BEDTIME    Dispense:  90 tablet    Refill:  3   buPROPion (WELLBUTRIN XL) 150 MG 24 hr tablet    Sig: Take 1 tablet (150 mg total) by mouth daily. In morning.    Dispense:  90 tablet    Refill:  3    Add future refills   FLUoxetine (PROZAC) 40 MG capsule    Sig: Take 1 capsule (40 mg total) by mouth daily.    Dispense:  90 capsule    Refill:  3    Add future refills   lisinopril (ZESTRIL) 2.5 MG tablet     Sig: Take 1 tablet (2.5 mg total) by mouth daily.    Dispense:  90 tablet    Refill:  3    Add refills   metFORMIN (GLUCOPHAGE) 1000 MG tablet    Sig: Take 1 tablet (1,000 mg total) by mouth 2 (two) times daily with a meal.    Dispense:  180 tablet    Refill:  3    Add refills   tamsulosin (FLOMAX) 0.4 MG CAPS capsule    Sig: Take 1 capsule (0.4 mg total) by mouth daily after breakfast.    Dispense:  90 capsule    Refill:  3    Add refills   MOUNJARO 7.5 MG/0.5ML Pen    Sig: Inject 7.5 mg into the skin once a week.    Dispense:  6 mL    Refill:  1    Dose increase from 5 to 7.63m      Follow up plan: Return in about 3 months (around 09/21/2022) for 3 month follow-up DM A1c, OA Back Knee.  ANobie Putnam [employer] 06/22/2022, 9:46 AM

## 2022-06-22 NOTE — Assessment & Plan Note (Signed)
BPH Improved on Tamsulosin but still has urinary symptoms  Stable PSA range

## 2022-06-22 NOTE — Assessment & Plan Note (Signed)
Controlled cholesterol on statin and lifestyle Last lipid panel 05/2022   Plan: 1. Continue current meds - Atorvastatin '10mg'$  2. Encourage improved lifestyle - low carb/cholesterol, reduce portion size, continue improving regular exercise

## 2022-06-22 NOTE — Assessment & Plan Note (Addendum)
Still uncontrolled DIABETES with A1c 9.2 Prior 8 range, now on Mounjaro seems had been doing better and wt loss but unsure why A1c not improved Identified some dietary issues still Complications - other including hyperlipidemia, GERD, obesity - increases risk of future cardiovascular complications  OFF Bydureon  Plan:  1. Dose increase Mounjaro from '5mg'$  up to 7.'5mg'$  weekly - Continue Metformin '1000mg'$  BID - future may discontinue if controlled on GLP/GIP therapy Goal w/ wt loss to reduce meds 2. Encourage improved lifestyle - low carb, low sugar diet, reduce portion size, continue improving regular exercise 3. Check CBG , bring log to next visit for review  Urine microalbumin Need DM Eye exam

## 2022-06-22 NOTE — Patient Instructions (Addendum)
Thank you for coming to the office today.  Mounjaro dose increase from 5 to 7.5m weekly  Last back x-ray 2022 showed moderate osteoarthritis degenerative changes low back lumbar spine  Recommend to start taking Tylenol Extra Strength 5042mtabs - take 1 to 2 tabs per dose (max 100014mevery 6-8 hours for pain (take regularly, don't skip a dose for next 7 days), max 24 hour daily dose is 6 tablets or 3000m44mn the future you can repeat the same everyday Tylenol course for 1-2 weeks at a time.  - This is safe to take with anti-inflammatory medicines (Ibuprofen, Advil, Naproxen, Aleve, Meloxicam, Mobic)  Goal to dial down back pain with the Tylenol  In future if interested in renew mobic, muscle relaxant, gabapentin etc, let me know Next we can consider Physical Therapy and other options w/ Orthopedic  ---------------------------  Ordered the Cologuard (home kit) test for colon cancer screening. Stay tuned for further updates.  It will be shipped to you directly. If not received in 2-4 weeks, call us [employer] company.   If you send it back and no results are received in 2-4 weeks, call us [employer] company as well!   Colon Cancer Screening: - For all adults age 50+ 94+tine colon cancer screening is highly recommended.     - Recent guidelines from Amer[place]end starting age of 45 -77arly detection of colon cancer is important, because often there are no warning signs or symptoms, also if found early usually it can be cured. Late stage is hard to treat.   - If Cologuard is NEGATIVE, then it is good for 3 years before next due - If Cologuard is POSITIVE, then it is strongly advised to get a Colonoscopy, which allows the GI doctor to locate the source of the cancer or polyp (even very early stage) and treat it by removing it. ------------------------- Follow instructions to collect sample, you may call the company for any help or questions, 24/7 telephone support at  [phone]--------------------------------------   [facility] Paddress: [address] [id number]: ([phone]: [email or web address] [place] [place] [id number]: [phone]  [facility] LLCdress: [address] [phone]: [phone] Inc[address] [place]: ([phone] Memorial Hospitalress: [address] [phone]: [phone] schedule a Follow-up Appointment to: Return in about 3 months (around 09/21/2022) for 3 month follow-up DM A1c, OA Back Knee.  If you have any other questions or concerns, please feel free to call the office or send a message through MyChShelter Island Heightsu may also schedule an earlier appointment if necessary.  Additionally, you may be receiving a survey about your experience at our office within a few days to 1 week by e-mail or mail. We value your feedback.  AlexNobie Putnam [place]

## 2022-06-22 NOTE — Assessment & Plan Note (Signed)
BMI >37 with risk factors comorbid type 2 diabetes, and hyperlipidemia, meet criteria for morbid obesity dx  Encourage wt loss and lifestyle, on GLP1

## 2022-06-23 LAB — MICROALBUMIN / CREATININE URINE RATIO
Creatinine, Urine: 134 mg/dL (ref 20–320)
Microalb Creat Ratio: 12 mcg/mg creat (ref <30)
Microalb, Ur: 1.6 mg/dL

## 2022-08-06 LAB — COLOGUARD: COLOGUARD: NEGATIVE

## 2022-08-28 ENCOUNTER — Other Ambulatory Visit: Payer: Self-pay | Admitting: Family Medicine

## 2022-08-28 DIAGNOSIS — K219 Gastro-esophageal reflux disease without esophagitis: Secondary | ICD-10-CM

## 2022-08-28 NOTE — Telephone Encounter (Signed)
Requested Prescriptions  Pending Prescriptions Disp Refills   omeprazole (PRILOSEC) 40 MG capsule [Pharmacy Med Name: OMEPRAZOLE DR 40 MG CAPSULE] 180 capsule 0    Sig: TAKE 1 CAPSULE (40 MG TOTAL) BY MOUTH 2 (TWO) TIMES DAILY BEFORE A MEAL.     Gastroenterology: Proton Pump Inhibitors Passed - 08/28/2022  4:26 AM      Passed - Valid encounter within last 12 months    Recent Outpatient Visits           2 months ago Annual physical exam   [facility] [place], Devonne Doughty, DO   8 months ago Major depressive disorder, recurrent, moderate [facility])   [facility] Olin Hauser, DO   9 months ago SK (seborrheic keratosis)   [facility] Olin Hauser, DO   1 year ago Annual physical exam   [facility] Olin Hauser, DO   1 year ago Heat intolerance   [facility] [place], Coralie Keens, NP       Future Appointments             In 3 weeks [facility], Devonne Doughty, [facility], [place]

## 2022-09-24 ENCOUNTER — Ambulatory Visit: Payer: Managed Care, Other (non HMO) | Admitting: Family Medicine

## 2022-09-25 ENCOUNTER — Encounter: Payer: Self-pay | Admitting: Family Medicine

## 2022-09-25 ENCOUNTER — Ambulatory Visit (INDEPENDENT_AMBULATORY_CARE_PROVIDER_SITE_OTHER): Payer: Managed Care, Other (non HMO) | Admitting: Family Medicine

## 2022-09-25 VITALS — BP 118/74 | HR 89 | Ht 69.5 in | Wt 241.0 lb

## 2022-09-25 DIAGNOSIS — E1169 Type 2 diabetes mellitus with other specified complication: Secondary | ICD-10-CM

## 2022-09-25 DIAGNOSIS — M545 Low back pain, unspecified: Secondary | ICD-10-CM

## 2022-09-25 DIAGNOSIS — G8929 Other chronic pain: Secondary | ICD-10-CM

## 2022-09-25 DIAGNOSIS — M47816 Spondylosis without myelopathy or radiculopathy, lumbar region: Secondary | ICD-10-CM

## 2022-09-25 LAB — POCT GLYCOSYLATED HEMOGLOBIN (HGB A1C): Hemoglobin A1C: 8.4 % — AB (ref 4.0–5.6)

## 2022-09-25 NOTE — Progress Notes (Unsigned)
Subjective:    Patient ID: Shawn Gordon, male    DOB: 1961/04/10, 62 y.o.   MRN: [id number]  Shawn Gordon is a 62 y.o. male presenting on 09/25/2022 for Diabetes   HPI  ***  Improved on Mounjaro overall. He still got hungry at '5mg'$  dose,  now he has seldom hunger and very good appetite suppression on Mounjaro 7.'5mg'$  Previously 9.2 ***A1c 8.4 Weight 271 lbs, down to 241 lbs Major improved lifestyle, quit drinking milk, less gatorade Quit fried foods, pizza Does not get hungry at night    CHRONIC DM, Type 2, Morbid Obesity  Interval Update with A1c elevated again up to 9.2 He drinks 1 coffee in AM, now drinking more hot chocolate lately and lactaid milk often but it is sugar free. No other changes to explain   CBGs: Rare low sugar hypoglycemia only due to not eating and still working outside triggered, had some hyperglycemia Meds: Metformin '1000mg'$  BID, Mounjaro '5mg'$  weekly Reports good compliance. Tolerating well w/o side-effects Currently on ACEi Lifestyle: Improved diet overall has reduced some things in diet overall. Walking more Admits R toe tingling if in wrong position - has improved some not having other areas of numbness tingling or pain. Present there for few years. Does not affect him walking and gait. Denies hypoglycemia, polyuria, visual changes, numbness.   ***Low Back, chronic pain Last X-ray 02/2021  ***fixed position back pain will increase and worsen  Sleep on L side and it can bother or flare up L hip ***   Had problem with Knees, and hips, but those improved. No longer needs x-ray   Past Surgical History:  Procedure Laterality Date   SHOULDER ARTHROSCOPY WITH SUBACROMIAL DECOMPRESSION AND OPEN ROTATOR C Right 05/17/2020   Procedure: Right shoulder arthroscopic subscapularis repair, mini-open supraspinatus repair, subacromial decompression, distal clavicle excision, and open biceps tenodesis;  Surgeon: Leim Fabry, MD;  Location: [facility];  Service: Orthopedics;  Laterality: Right;   SHOULDER ARTHROSCOPY WITH SUBACROMIAL DECOMPRESSION, ROTATOR CUFF REPAIR AND BICEP TENDON REPAIR Left 09/01/2019   Procedure: SHOULDER ARTHROSCOPY WITH SUBACROMIAL DECOMPRESSION, ROTATOR CUFF REPAIR AND BICEP TENDON REPAIR, SUBSACAPULAIRS REPAIR, DISTAL CLAVICLE EXCISION;  Surgeon: Leim Fabry, MD;  Location: [facility];  Service: Orthopedics;  Laterality: Left;  Diabetic - oral and injectable meds [name]IST[facility]   TOTAL HIP ARTHROPLASTY Bilateral 2007   Left - 2007, right - 2008     Health Maintenance: ***     06/22/2022    9:28 AM 12/18/2021    8:38 AM 11/11/2021    3:09 PM  Depression screen PHQ 2/9  Decreased Interest '1 3 2  '$ Down, Depressed, Hopeless '1 3 1  '$ PHQ - 2 Score '2 6 3  '$ Altered sleeping 0 2 0  Tired, decreased energy '1 3 1  '$ Change in appetite 0 3 2  Feeling bad or failure about yourself  0 3 1  Trouble concentrating '2 3 1  '$ Moving slowly or fidgety/restless 0 0 0  Suicidal thoughts 0 0 0  PHQ-9 Score '5 20 8  '$ Difficult doing work/chores Very difficult Extremely dIfficult Not difficult at all    Social History   Tobacco Use   Smoking status: Former    Packs/day: 2.00    Types: Cigarettes    Start date: 04/19/1978    Quit date: 02/26/2001    Years since quitting: 21.5   Smokeless tobacco: Former  Scientific laboratory technician Use: Never used  Substance Use Topics  Alcohol use: No    Alcohol/week: 0.0 standard drinks of alcohol   Drug use: No    Review of Systems Per HPI unless specifically indicated above     Objective:    BP 118/74   Pulse 89   Ht 5' 9.5" (1.765 m)   Wt 241 lb (109.3 kg)   SpO2 98%   BMI 35.08 kg/m   Wt Readings from Last 3 Encounters:  09/25/22 241 lb (109.3 kg)  06/22/22 261 lb (118.4 kg)  12/18/21 268 lb 3.2 oz (121.7 kg)    Physical Exam  I have personally reviewed the radiology report from 03/13/21 Lumbar X-ray.  CLINICAL DATA:  Chronic low back pain    EXAM: LUMBAR SPINE - COMPLETE 4+ VIEW   COMPARISON:  None.   FINDINGS: Lower thoracic and lumbar spine multilevel degenerative changes, most pronounced at L5-S1 where there is marked disc space narrowing, sclerosis and anterior osteophytes. Facet arthropathy most severe at L4-5 and L5-S1 with marked sclerosis and bony hypertrophy.   No visualized pars defects. No acute compression fracture, wedge-shaped deformity or focal kyphosis. Normal SI joints for age. Partial imaging of the hip replacements bilaterally.   Nonobstructive bowel gas pattern.  Aorta atherosclerotic.   IMPRESSION: Lower thoracic and lumbar degenerative changes as above. No acute finding by plain radiography   Aortic Atherosclerosis (ICD10-I70.0).     Electronically Signed   By: Jerilynn Mages.  Shick M.D.   On: 03/14/2021 12:07  Results for orders placed or performed in visit on 09/25/22  POCT HgB A1C  Result Value Ref Range   Hemoglobin A1C 8.4 (A) 4.0 - 5.6 %      Assessment & Plan:   Problem List Items Addressed This Visit     Back ache   Relevant Orders   DG Lumbar Spine Complete   Spondylosis of lumbar region without myelopathy or radiculopathy   Relevant Orders   DG Lumbar Spine Complete   Type 2 diabetes mellitus with other specified complication ([place]) - Primary   Relevant Orders   POCT HgB A1C (Completed)    Orders Placed This Encounter  Procedures   DG Lumbar Spine Complete    Standing Status:   Future    Standing Expiration Date:   09/25/2023    Order Specific Question:   Reason for Exam (SYMPTOM  OR DIAGNOSIS REQUIRED)    Answer:   chronic low back pain osteoarthritis thoracolumbar DDD, repeat x-ray    Order Specific Question:   Preferred imaging location?    Answer:   [facility]   POCT HgB A1C     No orders of the defined types were placed in this encounter.   Follow up plan: Return in about 4 months (around 01/25/2023) for 4 month follow-up DM A1c, Back Pain Arthritis X-ray  review.   [name], [facility] 09/25/2022, 3:49 PM

## 2022-09-25 NOTE — Patient Instructions (Addendum)
Thank you for coming to the office today.  Recent Labs    12/11/21 0754 06/15/22 0926 09/25/22 1557  HGBA1C 8.6* 9.2* 8.4*   Keep on Mounjaro 7.'5mg'$  , we can adjust it in the future to [facility] '10mg'$ , please let me know in advance.  X-ray Lumbar Spine anytime you prefer. Walk in Mon - Thursday, Not on Friday, 8am to 1130am or 130 to 4pm  We can look at x-rays together.  Please schedule a Follow-up Appointment to: Return in about 4 months (around 01/25/2023) for 4 month follow-up DM A1c, Back Pain Arthritis X-ray review.  If you have any other questions or concerns, please feel free to call the office or send a message through [facility]. You may also schedule an earlier appointment if necessary.  Additionally, you may be receiving a survey about your experience at our office within a few days to 1 week by e-mail or mail. We value your feedback.  [name], DO [place]

## 2022-09-26 ENCOUNTER — Encounter: Payer: Self-pay | Admitting: Family Medicine

## 2022-09-26 NOTE — Assessment & Plan Note (Signed)
A1c improved to 8.4 from 9 range Complications - other including hyperlipidemia, GERD, obesity - increases risk of future cardiovascular complications  OFF Bydureon  Plan:  1. Continue Mounjaro 7.'5mg'$  weekly - Continue Metformin '1000mg'$  BID - future may discontinue if controlled on GLP/GIP therapy Goal w/ wt loss to reduce meds 2. Encourage improved lifestyle - low carb, low sugar diet, reduce portion size, continue improving regular exercise 3. Check CBG , bring log to next visit for review

## 2022-11-09 ENCOUNTER — Ambulatory Visit: Payer: Self-pay | Admitting: *Deleted

## 2022-11-09 ENCOUNTER — Telehealth: Payer: Self-pay | Admitting: Family Medicine

## 2022-11-09 NOTE — Telephone Encounter (Signed)
The patient called in to report he was seen in the [facility] ED in [place] for chest pain. He reported his cardiac enzymes were within normal limits as well as his liver enzymes. He states the doctor there ruled out cardiac event due to the blood work and negative chest x ray but has still referred him to see a Cardioloist either in [facility] or whatever his primary care provider thinks is best. Please assist patient further as he reports this information after speaking with a triage nurse this morning and going directly to the ER near where he was working.

## 2022-11-09 NOTE — Telephone Encounter (Signed)
  Chief Complaint: Chest pain Symptoms: "Pressure" since Thursday, worsening. Left nipple area, left arm "Just feels weak."  States was mostly constant, now comes and goes.Fatigued Frequency: Thursday, worsening Pertinent Negatives: Patient denies  Disposition: ED /[] Urgent Care (no appt availability in office) / Appointment(In office/virtual)/  [facility]/ Home Care/ Refused Recommended Disposition /[]  Mobile Bus/  Follow-up with PCP Additional Notes: Advised ED, pt is in [place], states will follow disposition, [facility].  Care advise provided, verbalizes understanding. Reason for Disposition  [1] Chest pain (or "angina") comes and goes AND [2] is happening more often (increasing in frequency) or getting worse (increasing in severity)  (Exception: Chest pains that last only a few seconds.)  Answer Assessment - Initial Assessment Questions 1. LOCATION: "Where does it hurt?"       Left nipple to upper right about   3- 1/2 inches 2. RADIATION: "Does the pain go anywhere else?" (e.g., into neck, jaw, arms, back)     No 3. ONSET: "When did the chest pain begin?" (Minutes, hours or days)      Thursday "Pressure" Left arm "Weak" 4. PATTERN: "Does the pain come and go, or has it been constant since it started?"  "Does it get worse with exertion?"      Constant mostly 5. DURATION: "How long does it last" (e.g., seconds, minutes, hours)     Constant mostly. Now coming and going 6. SEVERITY: "How bad is the pain?"  (e.g., Scale 1-10; mild, moderate, or severe)    - MILD (1-3): doesn't interfere with normal activities     - MODERATE (4-7): interferes with normal activities or awakens from sleep    - SEVERE (8-10): excruciating pain, unable to do any normal activities       5/10  10. OTHER SYMPTOMS: "Do you have any other symptoms?" (e.g., dizziness, nausea, vomiting, sweating, fever, difficulty breathing, cough)       Faigued  Protocols used: Chest  Pain-A-AH

## 2022-11-09 NOTE — Telephone Encounter (Signed)
This will require a hospital follow-up apt. To review the ED visit, symptoms, questions, and referrals.  Thank you  Saralyn Pilar, DO [facility] 11/09/2022, 5:42 PM

## 2022-11-10 ENCOUNTER — Telehealth: Payer: Self-pay

## 2022-11-10 NOTE — Transitions of Care (Post Inpatient/ED Visit) (Signed)
   11/10/2022  Name: Shawn Gordon MRN: [id number] DOB: [birth year]  Today's TOC FU Call Status: Today's TOC FU Call Status:: Successful TOC FU Call Competed TOC FU Call Complete Date: 11/10/22  Transition Care Management Follow-up Telephone Call Date of Discharge: 11/09/22 Discharge Facility: Other ([facility]) Name of Other ([facility]) Discharge Facility: [facility] Type of Discharge: Emergency Department Reason for ED Visit: Cardiac Conditions Cardiac Conditions Diagnosis: Chest Pain Persisting How have you been since you were released from the hospital?: Better Any questions or concerns?: No  Items Reviewed: Did you receive and understand the discharge instructions provided?: Yes Medications obtained and verified?: Yes (Medications Reviewed) Any new allergies since your discharge?: No Dietary orders reviewed?: NA  Home Care and Equipment/Supplies: Were Home Health Services Ordered?: NA Any new equipment or medical supplies ordered?: NA  Functional Questionnaire: Do you need assistance with bathing/showering or dressing?: No Do you need assistance with meal preparation?: No Do you need assistance with eating?: No Do you have difficulty maintaining continence: No Do you need assistance with getting out of bed/getting out of a chair/moving?: No Do you have difficulty managing or taking your medications?: No  Follow up appointments reviewed: PCP Follow-up appointment confirmed?: Yes Date of PCP follow-up appointment?: 11/12/22 Follow-up Provider: Dr. Althea Charon Specialist [facility] Follow-up appointment confirmed?: NA Do you need transportation to your follow-up appointment?: No Do you understand care options if your condition(s) worsen?: Yes-patient verbalized understanding    Oneal Grout, [facility]) Lovie Macadamia [phone]

## 2022-11-10 NOTE — Telephone Encounter (Signed)
Left message for patient to schedule appointment.

## 2022-11-12 ENCOUNTER — Ambulatory Visit: Payer: Managed Care, Other (non HMO) | Admitting: Family Medicine

## 2022-11-12 ENCOUNTER — Encounter: Payer: Self-pay | Admitting: Family Medicine

## 2022-11-12 VITALS — BP 100/62 | HR 78 | Ht 69.5 in | Wt 235.0 lb

## 2022-11-12 DIAGNOSIS — R0789 Other chest pain: Secondary | ICD-10-CM | POA: Diagnosis not present

## 2022-11-12 DIAGNOSIS — T887XXA Unspecified adverse effect of drug or medicament, initial encounter: Secondary | ICD-10-CM

## 2022-11-12 DIAGNOSIS — K5900 Constipation, unspecified: Secondary | ICD-10-CM

## 2022-11-12 DIAGNOSIS — K30 Functional dyspepsia: Secondary | ICD-10-CM | POA: Diagnosis not present

## 2022-11-12 DIAGNOSIS — E1169 Type 2 diabetes mellitus with other specified complication: Secondary | ICD-10-CM

## 2022-11-12 NOTE — Progress Notes (Signed)
Subjective:    Patient ID: Shawn Gordon, male    DOB: Apr 29, 1961, 62 y.o.   MRN: [id number]  Shawn Gordon is a 62 y.o. male presenting on 11/12/2022 for Hospitalization Follow-up   HPI  ED FOLLOW-UP VISIT  Hospital/Location: [facility] ED Date of ED Visit: 11/09/22  Reason for Presenting to ED: Chest Pain  FOLLOW-UP  - ED provider note and record have been reviewed - Patient presents today about 3 days after recent ED visit. Brief summary of recent course, patient had symptoms of slight atypical chest pain similar to something that he has experienced for a long time. He did not do as much over the weekend and rested. He said on Monday he went to work and felt bad with chest discomfort. Also had an usual Left arm paresthesia tingling abnormal symptom. He had no BM for a while, but he did have one at the hospital. It may have been 7 days or so. He was evaluated in hospital at [facility] ED for chest pain rule out. He had CXR negative, EKG, Cardiac Enzymes negative. Labs were done, normal Hgb. He continues on baby Aspirin . He was discharged and he had another BM at home. They asked him to f/u with Cardiology outpatient.  - Today reports overall has done well after discharge from ED. Symptoms of chest pain have mostly resolved. He has very mild residual discomfort. He says believes the constipation has been a factor. He would like to start Metamucil for this. He said on GLP injectable for a while with significant wt loss and has not bothered his bowels until recently. He used to be more regular  He takes PPI Omeprazole  TWICE A DAY  - New medications on discharge: None - Changes to current meds on discharge: None  I have reviewed the discharge medication list, and have reconciled the current and discharge medications today.  Prior cardiac work up 2000 and last one >8+ years.     11/12/2022   10:34 AM 06/22/2022    9:28 AM 12/18/2021    8:38 AM  Depression screen PHQ 2/9   Decreased Interest Down, Depressed, Hopeless 0 1 3  PHQ - 2 Score Altered sleeping 0 0 2  Tired, decreased energy Change in appetite 1 0 3  Feeling bad or failure about yourself  1 0 3  Trouble concentrating 0 2 3  Moving slowly or fidgety/restless 0 0 0  Suicidal thoughts 0 0 0  PHQ-9 Score Difficult doing work/chores  Very difficult Extremely dIfficult    Social History   Tobacco Use   Smoking status: Former    Packs/day: 2    Types: Cigarettes    Start date: 04/19/1978    Quit date: 02/26/2001    Years since quitting: 21.7   Smokeless tobacco: Former  Building services engineer Use: Never used  Substance Use Topics   Alcohol use: No    Alcohol/week: 0.0 standard drinks of alcohol   Drug use: No    Review of Systems Per HPI unless specifically indicated above     Objective:    BP 100/62   Pulse 78   Ht 5' 9.5" (1.765 m)   Wt 235 lb (106.6 kg)   SpO2 96%   BMI 34.21 kg/m   Wt Readings from Last 3 Encounters:  11/12/22 235 lb (106.6 kg)  09/25/22 241 lb (109.3 kg)  06/22/22 261 lb (118.4 kg)    Physical Exam Vitals and nursing note reviewed.  Constitutional:      General: He is not in acute distress.    Appearance: He is well-developed. He is not diaphoretic.     Comments: Well-appearing, comfortable, cooperative  HENT:     Head: Normocephalic and atraumatic.  Eyes:     General:        Right eye: No discharge.        Left eye: No discharge.     Conjunctiva/sclera: Conjunctivae normal.  Neck:     Thyroid: No thyromegaly.  Cardiovascular:     Rate and Rhythm: Normal rate and regular rhythm.     Pulses: Normal pulses.     Heart sounds: Normal heart sounds. No murmur heard. Pulmonary:     Effort: Pulmonary effort is normal. No respiratory distress.     Breath sounds: Normal breath sounds. No wheezing or rales.  Musculoskeletal:        General: Normal range of motion.     Cervical back: Normal range of motion and neck supple.   Lymphadenopathy:     Cervical: No cervical adenopathy.  Skin:    General: Skin is warm and dry.     Findings: No erythema or rash.  Neurological:     Mental Status: He is alert and oriented to person, place, and time. Mental status is at baseline.  Psychiatric:        Behavior: Behavior normal.     Comments: Well groomed, good eye contact, normal speech and thoughts      Results for orders placed or performed in visit on 09/25/22  POCT HgB A1C  Result Value Ref Range   Hemoglobin A1C 8.4 (A) 4.0 - 5.6 %      Assessment & Plan:   Problem List Items Addressed This Visit     Type 2 diabetes mellitus with other specified complication   Other Visit Diagnoses     Atypical chest pain    -  Primary   Relevant Orders   Ambulatory referral to Cardiology   Indigestion       Constipation, unspecified constipation type       Medication side effect           ED Follow up Atypical Chest pain rule out,  Work up was negative. Reviewed results. Age 32 male with comorbid risk factors for cardiovascular disease, we discussed importance of pursuing further more definitive testing.  I believe that the symptoms are related to gas bloating constipation. I would recommend that we treat this and also referral for Cardiology for 2nd opinion evaluation and cardiac stress testing.  [facility]) HeartCare at [facility] [address] [place], [place] [address] Main: [phone]   Proceed w/ Metamucil + Miralax regimen.  Continue PPI Pantoprazole  TWICE A DAY Continue Mounjaro - may be contributing to side effects but prefer to maintain medication  Follow up and return precautions reviewed.  Orders Placed This Encounter  Procedures   Ambulatory referral to Cardiology    Referral Priority:   Routine    Referral Type:   Consultation    Referral Reason:   Specialty Services Required    Number of Visits Requested:   1     No orders of the defined  types were placed in this encounter.     Follow up plan: Return if symptoms worsen or fail to improve.  Saralyn Pilar, DO [facility] 11/12/2022, 10:42 AM

## 2022-11-12 NOTE — Patient Instructions (Addendum)
Thank you for coming to the office today.  I believe that the symptoms are related to gas bloating constipation. I would recommend that we treat this and also referral for Cardiology for 2nd opinion evaluation and cardiac stress testing.  [facility]) HeartCare at [facility] [address] [place], [place] [address] Main: [phone]   Stay tuned for referral  ----------------------------------  If you have any significant chest pain that does not go away within 30 minutes, is accompanied by nausea, sweating, shortness of breath, or made worse by activity, this may be evidence of a heart attack, especially if symptoms worsening instead of improving, please call 911 or go directly to the emergency room immediately for evaluation.  For Constipation (less frequent bowel movement that can be hard dry or involve straining).  Recommend trying OTC Miralax 17g = 1 capful in large glass water once daily for now, try several days to see if working, goal is soft stool or BM 1-2 times daily, if too loose then reduce dose or try every other day. If not effective may need to increase it to 2 doses at once in AM or may do 1 in morning and 1 in afternoon/evening  - This medicine is very safe and can be used often without any problem and will not make you dehydrated. It is good for use on AS NEEDED BASIS or even MAINTENANCE therapy for longer term for several days to weeks at a time to help regulate bowel movements  Other more natural remedies or preventative treatment: - Increase hydration with water - Increase fiber in diet (high fiber foods = vegetables, leafy greens, oats/grains) - May take OTC Fiber supplement (metamucil powder or pill/gummy) - May try OTC Probiotic    Please schedule a Follow-up Appointment to: Return if symptoms worsen or fail to improve.  If you have any other questions or concerns, please feel free to call the office or send a message through  MyChart. You may also schedule an earlier appointment if necessary.  Additionally, you may be receiving a survey about your experience at our office within a few days to 1 week by e-mail or mail. We value your feedback.  Saralyn Pilar, DO [facility], [place]

## 2022-11-27 ENCOUNTER — Other Ambulatory Visit: Payer: Self-pay | Admitting: Family Medicine

## 2022-11-27 DIAGNOSIS — K219 Gastro-esophageal reflux disease without esophagitis: Secondary | ICD-10-CM

## 2022-11-27 NOTE — Telephone Encounter (Signed)
Requested Prescriptions  Pending Prescriptions Disp Refills   omeprazole (PRILOSEC) 40 MG capsule [Pharmacy Med Name: OMEPRAZOLE DR 40 MG CAPSULE] 180 capsule 0    Sig: TAKE 1 CAPSULE (40 MG TOTAL) BY MOUTH 2 (TWO) TIMES DAILY BEFORE A MEAL.     Gastroenterology: Proton Pump Inhibitors Passed - 11/27/2022 11:31 AM      Passed - Valid encounter within last 12 months    Recent Outpatient Visits           2 weeks ago Atypical chest pain   [place] [facility] [place], Netta Neat, DO   2 months ago Type 2 diabetes mellitus with other specified complication, without long-term current use of insulin [facility])   [place] [facility], Netta Neat, DO   5 months ago Annual physical exam   [place] [facility] Smitty Cords, DO   11 months ago Major depressive disorder, recurrent, moderate [facility])   [place] [facility] Smitty Cords, DO   1 year ago SK (seborrheic keratosis)   [place] [facility] Smitty Cords, DO       Future Appointments             In 1 month [place], Arlys John, MD [facility] at [place]   In 2 months Althea Charon, Netta Neat, DO [place] [facility], [facility]

## 2022-11-29 ENCOUNTER — Other Ambulatory Visit: Payer: Self-pay | Admitting: Family Medicine

## 2022-11-29 DIAGNOSIS — E1169 Type 2 diabetes mellitus with other specified complication: Secondary | ICD-10-CM

## 2022-11-30 NOTE — Telephone Encounter (Signed)
Requested medication (s) are due for refill today - yes  Requested medication (s) are on the active medication list -yes  Future visit scheduled -yes  Last refill: 06/22/22 6ml 1RF  Notes to clinic: off protocol- provider review   Requested Prescriptions  Pending Prescriptions Disp Refills   MOUNJARO 7.5 MG/0.5ML Pen [Pharmacy Med Name: MOUNJARO 7.5 MG/0.5 ML PEN]  1    Sig: INJECT 7.5 MG SUBCUTANEOUSLY WEEKLY     Off-Protocol Failed - 11/29/2022  1:20 AM      Failed - Medication not assigned to a protocol, review manually.      Passed - Valid encounter within last 12 months    Recent Outpatient Visits           2 weeks ago Atypical chest pain   [place] [facility] [place], Netta Neat, DO   2 months ago Type 2 diabetes mellitus with other specified complication, without long-term current use of insulin [facility])   [place] [facility] [place], Netta Neat, DO   5 months ago Annual physical exam   [place] [facility] Smitty Cords, DO   11 months ago Major depressive disorder, recurrent, moderate [facility])   [place] [facility] Smitty Cords, DO   1 year ago SK (seborrheic keratosis)   [place] [facility] Smitty Cords, DO       Future Appointments             In 1 month Agbor-Etang, Arlys John, MD [facility] [place]   In 2 months Althea Charon, Netta Neat, DO [place] [facility], [facility]               Requested Prescriptions  Pending Prescriptions Disp Refills   MOUNJARO 7.5 MG/0.5ML Pen [Pharmacy Med Name: MOUNJARO 7.5 MG/0.5 ML PEN]  1    Sig: INJECT 7.5 MG SUBCUTANEOUSLY WEEKLY     Off-Protocol Failed - 11/29/2022  1:20 AM      Failed - Medication not assigned to a protocol, review manually.      Passed - Valid encounter within last 12 months    Recent Outpatient Visits           2 weeks ago  Atypical chest pain   [place] [facility] [place], Netta Neat, DO   2 months ago Type 2 diabetes mellitus with other specified complication, without long-term current use of insulin [facility])   [facility] [place], Netta Neat, DO   5 months ago Annual physical exam   [place] [facility] Smitty Cords, DO   11 months ago Major depressive disorder, recurrent, moderate [facility])   [place] [facility] Smitty Cords, DO   1 year ago SK (seborrheic keratosis)   [place] [facility] Smitty Cords, DO       Future Appointments             In 1 month Agbor-Etang, Arlys John, MD [facility] [place]   In 2 months Althea Charon, Netta Neat, DO [place] [facility], [facility]

## 2023-01-03 IMAGING — DX DG LUMBAR SPINE COMPLETE 4+V
5 series · 5 of 5 positions shown · non-contrast
Comparison: None.

CLINICAL DATA: Chronic low back pain

EXAM:
LUMBAR SPINE - COMPLETE 4+ VIEW

[l-spine obl (1 of 2)]
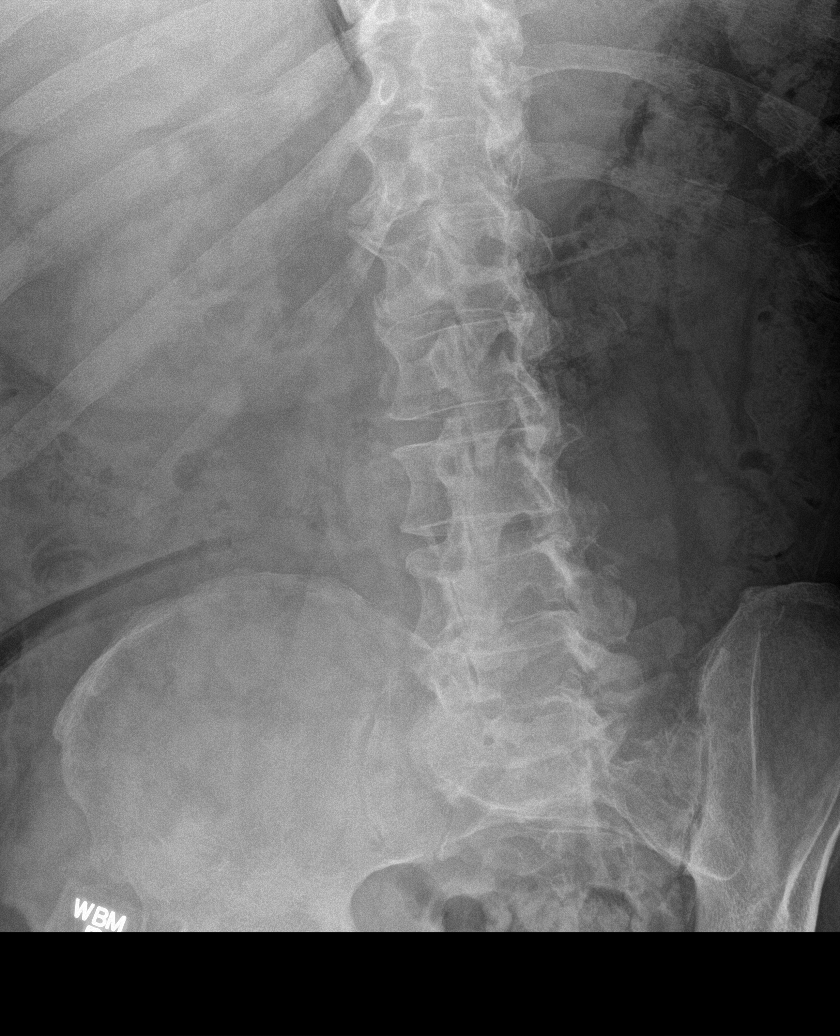

[l-spine obl (2 of 2)]
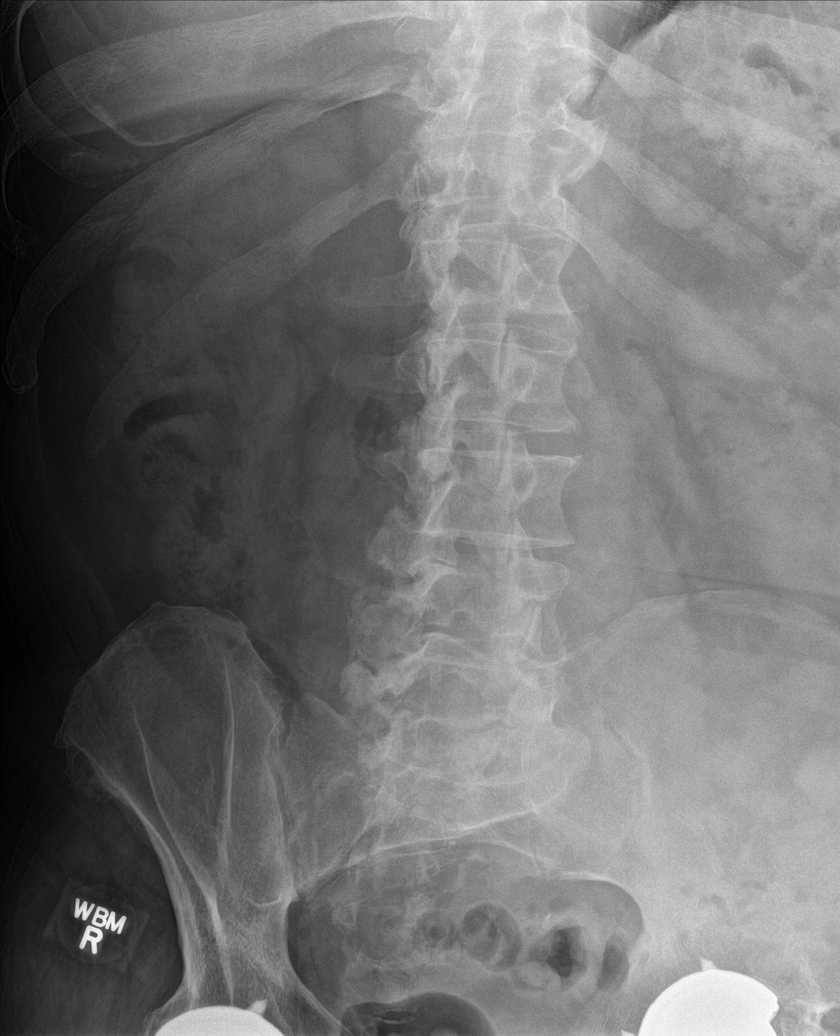

[l-spine lat]
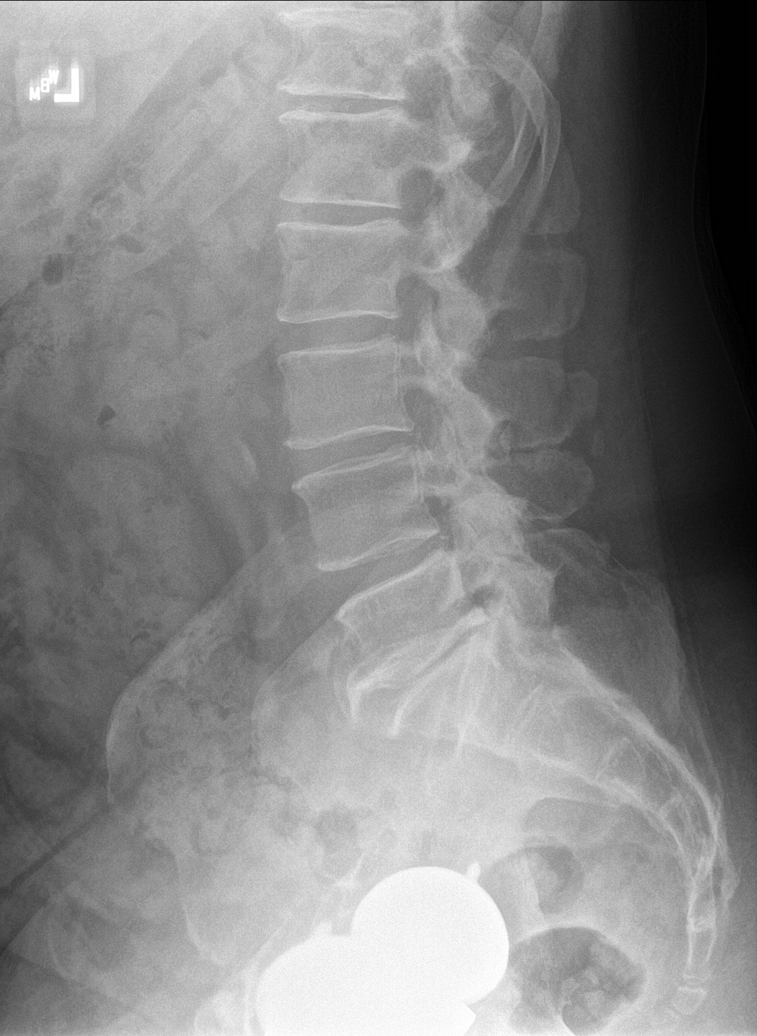

[l-spine spot]
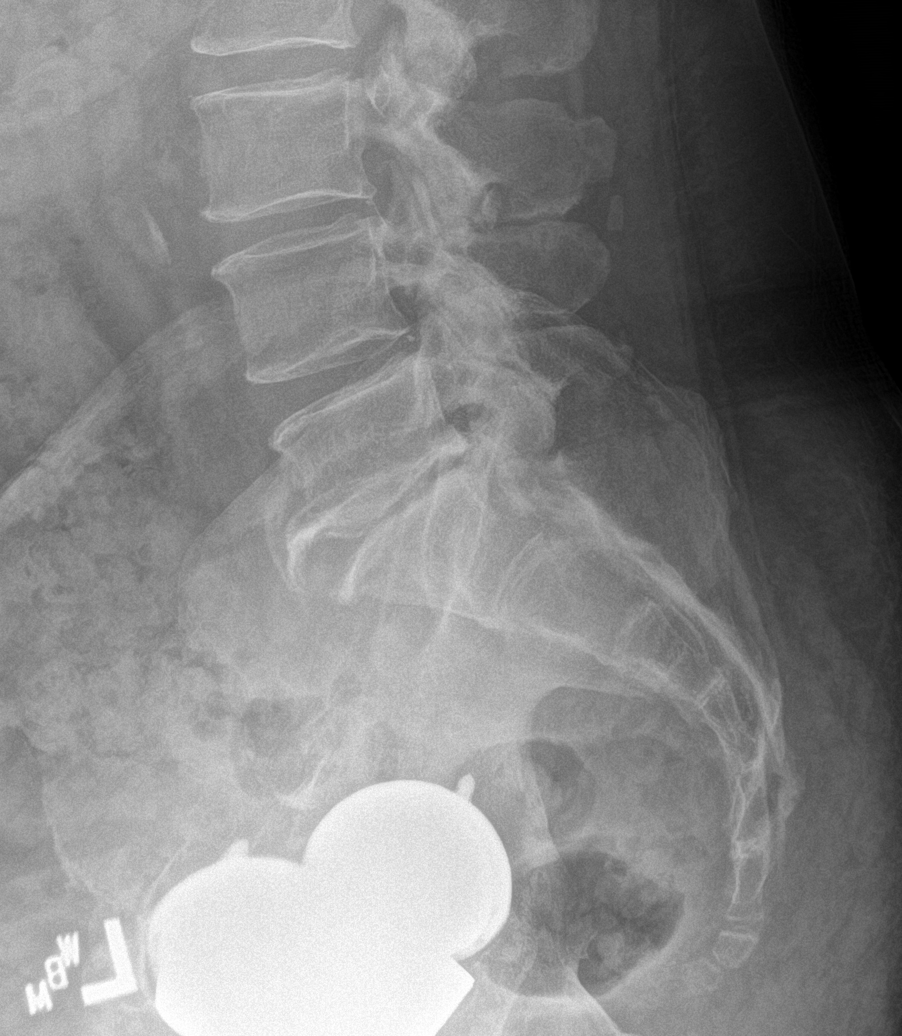

[l-spine ap]
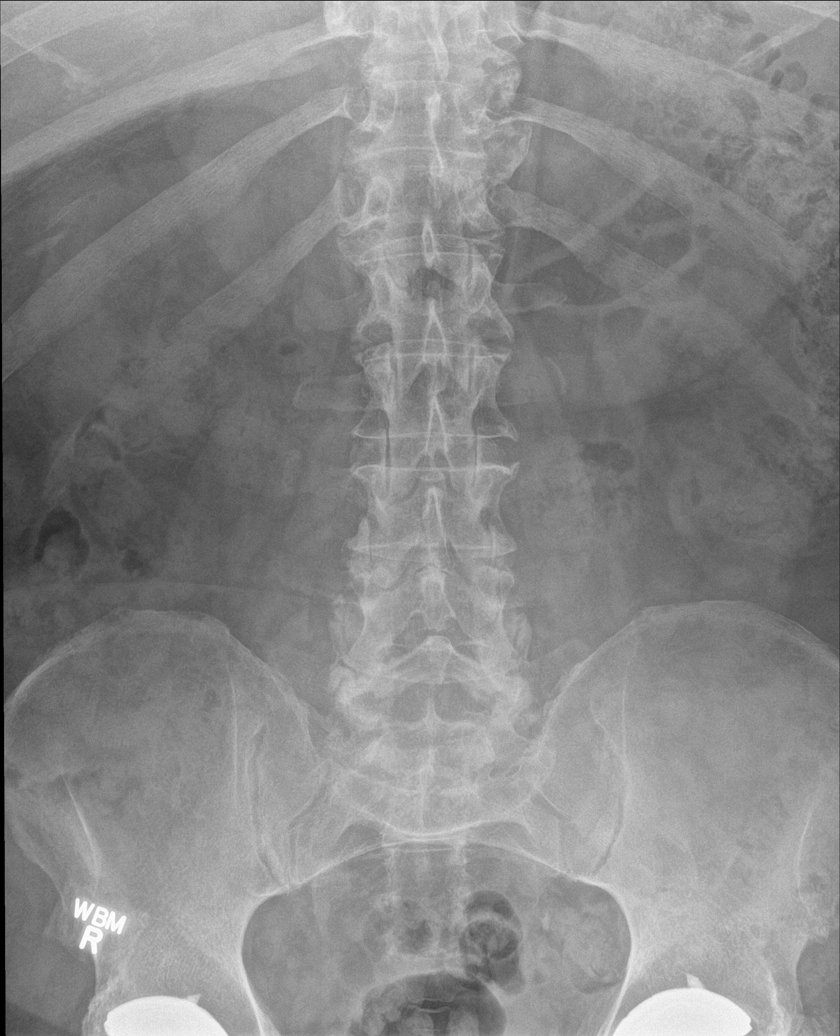

[5 of 5 positions shown; findings below may reference images not displayed]

FINDINGS: Lower thoracic and lumbar spine multilevel degenerative changes,
most pronounced at L5-S1 where there is marked disc space narrowing,
sclerosis and anterior osteophytes. Facet arthropathy most severe at
L4-5 and L5-S1 with marked sclerosis and bony hypertrophy.

No visualized pars defects. No acute compression fracture,
wedge-shaped deformity or focal kyphosis. Normal SI joints for age.
Partial imaging of the hip replacements bilaterally.

Nonobstructive bowel gas pattern.  Aorta atherosclerotic.
IMPRESSION: Lower thoracic and lumbar degenerative changes as above. No acute
finding by plain radiography

Aortic Atherosclerosis ([id number].I).

## 2023-01-07 ENCOUNTER — Ambulatory Visit: Payer: Managed Care, Other (non HMO) | Attending: Cardiology | Admitting: Cardiology

## 2023-01-07 ENCOUNTER — Other Ambulatory Visit
Admission: RE | Admit: 2023-01-07 | Discharge: 2023-01-07 | Disposition: A | Discharge status: Home / Self Care | Payer: Managed Care, Other (non HMO) | Source: Ambulatory Visit | Attending: Cardiology | Admitting: Cardiology

## 2023-01-07 ENCOUNTER — Encounter: Payer: Self-pay | Admitting: Cardiology

## 2023-01-07 VITALS — BP 118/68 | HR 84 | Ht 69.5 in | Wt 231.4 lb

## 2023-01-07 DIAGNOSIS — R072 Precordial pain: Secondary | ICD-10-CM | POA: Diagnosis not present

## 2023-01-07 DIAGNOSIS — E781 Pure hyperglyceridemia: Secondary | ICD-10-CM | POA: Diagnosis not present

## 2023-01-07 LAB — BASIC METABOLIC PANEL
Anion gap: 11 (ref 5–15)
BUN: 22 mg/dL (ref 8–23)
CO2: 23 mmol/L (ref 22–32)
Calcium: 9.3 mg/dL (ref 8.9–10.3)
Chloride: 99 mmol/L (ref 98–111)
Creatinine, Ser: 0.76 mg/dL (ref 0.61–1.24)
GFR, Estimated: >60 mL/min (ref >60)
Glucose, Bld: 114 mg/dL — ABNORMAL HIGH (ref 70–99)
Potassium: 4.3 mmol/L (ref 3.5–5.1)
Sodium: 133 mmol/L — ABNORMAL LOW (ref 135–145)

## 2023-01-07 MED ORDER — METOPROLOL TARTRATE 100 MG PO TABS: 100.0000 mg | ORAL_TABLET | Freq: Once | ORAL | 0 refills | Status: DC

## 2023-01-07 MED ORDER — IVABRADINE HCL 5 MG PO TABS: 10.0000 mg | ORAL_TABLET | Freq: Once | ORAL | 0 refills | Status: AC

## 2023-01-07 NOTE — Progress Notes (Signed)
Cardiology Office Note:    Date:  01/07/2023   ID:  Shawn Gordon, DOB 12/19/1960, MRN [id number]  PCP:  Smitty Cords, DO   [facility] Providers Cardiologist:  Debbe Odea, MD     Referring MD: Saralyn Pilar *   Chief Complaint  Patient presents with   New Patient (Initial Visit)    Patient reports seeing cardiology in 2001 in [place] and 2007 in [place] (possible [facility] cardiology)   Shawn PITTSLEY is a 61 y.o. male who is being seen today for the evaluation of chest pain at the request of Saralyn Pilar *.   History of Present Illness:    Shawn Gordon is a 62 y.o. male with a hx of diabetes, hyperlipidemia, former smoker x 20 years presenting due to chest pain.  States having on and off chest pain for several years, occurring sporadically lasting up to 3 days.  About 2 months ago he had another episode of chest pain, this time pain lasted 5 days and got worse on his way to work.  Chest pain was located in the mid chest, also noticed some left arm pain.  He works in Engineer, structural.  He went to the emergency room where workup was unrevealing.  States doing okay since.  Denies any immediate family history of heart attacks.  He smoked in the past for about 20 years.  Past Medical History:  Diagnosis Date   Arthritis    Lower spine, wrist, hips(pre-replacement), shoulders   Asthma    Back pain    Diabetes mellitus, type 2 (HCC)    GERD (gastroesophageal reflux disease)    Presence of dental prosthetic device    2 implants.  1 top, 1 bottom    Past Surgical History:  Procedure Laterality Date   SHOULDER ARTHROSCOPY WITH SUBACROMIAL DECOMPRESSION AND OPEN ROTATOR C Right 05/17/2020   Procedure: Right shoulder arthroscopic subscapularis repair, mini-open supraspinatus repair, subacromial decompression, distal clavicle excision, and open biceps tenodesis;  Surgeon: Signa Kell, MD;  Location: [facility];  Service:  Orthopedics;  Laterality: Right;   SHOULDER ARTHROSCOPY WITH SUBACROMIAL DECOMPRESSION, ROTATOR CUFF REPAIR AND BICEP TENDON REPAIR Left 09/01/2019   Procedure: SHOULDER ARTHROSCOPY WITH SUBACROMIAL DECOMPRESSION, ROTATOR CUFF REPAIR AND BICEP TENDON REPAIR, SUBSACAPULAIRS REPAIR, DISTAL CLAVICLE EXCISION;  Surgeon: Signa Kell, MD;  Location: [facility];  Service: Orthopedics;  Laterality: Left;  Diabetic - oral and injectable meds [name] ASSISTING   TOTAL HIP ARTHROPLASTY Bilateral 2007   Left - 2007, right - 2008    Current Medications: Current Meds  Medication Sig   amoxicillin (AMOXIL) 500 MG capsule Take 500 mg by mouth daily as needed (prior to dental procedures).   atorvastatin (LIPITOR) 10 MG tablet TAKE 1 TABLET BY MOUTH EVERYDAY AT BEDTIME   B Complex Vitamins (B COMPLEX PO) Take by mouth daily.   buPROPion (WELLBUTRIN XL) 150 MG 24 hr tablet Take 1 tablet (150 mg total) by mouth daily. In morning.   Cholecalciferol (VITAMIN D3 PO) Take by mouth daily.   FLUoxetine (PROZAC) 40 MG capsule Take 1 capsule (40 mg total) by mouth daily.   glucose blood (TRUETEST TEST) test strip Check Blood Sugar 1-2 Times daily.   ivabradine (CORLANOR) 5 MG TABS tablet Take 2 tablets (10 mg total) by mouth once for 1 dose. TAKE TWO HOURS PRIOR TO CARDIAC CTA   lisinopril (ZESTRIL) 2.5 MG tablet Take 1 tablet (2.5 mg total) by mouth daily.   metFORMIN (GLUCOPHAGE)  1000 MG tablet Take 1 tablet (1,000 mg total) by mouth 2 (two) times daily with a meal.   metoprolol tartrate (LOPRESSOR) 100 MG tablet Take 1 tablet (100 mg total) by mouth once for 1 dose. TAKE 2 HOURS PRIOR TO CARDIAC CT   MOUNJARO 7.5 MG/0.5ML Pen INJECT 7.5 MG SUBCUTANEOUSLY WEEKLY   omeprazole (PRILOSEC) 40 MG capsule TAKE 1 CAPSULE (40 MG TOTAL) BY MOUTH 2 (TWO) TIMES DAILY BEFORE A MEAL.   tamsulosin (FLOMAX) 0.4 MG CAPS capsule Take 1 capsule (0.4 mg total) by mouth daily after breakfast.     Allergies:   Patient has  no known allergies.   Social History   Socioeconomic History   Marital status: Single    Spouse name: Not on file   Number of children: Not on file   Years of education: Not on file   Highest education level: Not on file  Occupational History   Occupation: Engineer, structural    Comment: Night-shift  Tobacco Use   Smoking status: Former    Packs/day: 2    Types: Cigarettes    Start date: 04/19/1978    Quit date: 02/26/2001    Years since quitting: 21.8   Smokeless tobacco: Former  Building services engineer Use: Never used  Substance and Sexual Activity   Alcohol use: No    Alcohol/week: 0.0 standard drinks of alcohol   Drug use: No   Sexual activity: Not on file  Other Topics Concern   Not on file  Social History Narrative   Not on file   Social Determinants of Health   Financial Resource Strain: Not on file  Food Insecurity: Not on file  Transportation Needs: Not on file  Physical Activity: Not on file  Stress: Not on file  Social Connections: Not on file     Family History: The patient's family history includes Cancer in his father; Heart disease in his mother; Hypertension in his paternal grandfather; Stroke in his father and mother.  ROS:   Please see the history of present illness.     All other systems reviewed and are negative.  EKGs/Labs/Other Studies Reviewed:    The following studies were reviewed today:       Recent Labs: 06/15/2022: ALT 38; Hemoglobin 12.8; Platelets 338; TSH 1.13 01/07/2023: BUN 22; Creatinine, Ser 0.76; Potassium 4.3; Sodium 133  Recent Lipid Panel    Component Value Date/Time   CHOL 164 06/15/2022 0926   TRIG 196 (H) 06/15/2022 0926   HDL 43 06/15/2022 0926   CHOLHDL 3.8 06/15/2022 0926   VLDL 31 (H) 01/14/2016 1647   LDLCALC 92 06/15/2022 0926     Risk Assessment/Calculations:              Physical Exam:    VS:  BP 118/68 (BP Location: Right Arm, Patient Position: Sitting, Cuff Size: Normal)   Pulse 84   Ht 5'  9.5" (1.765 m)   Wt 231 lb 6.4 oz (105 kg)   SpO2 96%   BMI 33.68 kg/m     Wt Readings from Last 3 Encounters:  01/07/23 231 lb 6.4 oz (105 kg)  11/12/22 235 lb (106.6 kg)  09/25/22 241 lb (109.3 kg)     GEN:  Well nourished, well developed in no acute distress HEENT: Normal NECK: No JVD; No carotid bruits CARDIAC: RRR, no murmurs, rubs, gallops RESPIRATORY:  Clear to auscultation without rales, wheezing or rhonchi  ABDOMEN: Soft, non-tender, non-distended MUSCULOSKELETAL:  No edema; No deformity  SKIN: Warm and dry NEUROLOGIC:  Alert and oriented x 3 PSYCHIATRIC:  Normal affect   ASSESSMENT:    1. Precordial pain   2. Pure hypertriglyceridemia    PLAN:    In order of problems listed above:  Chest pain, risk factors hyperlipidemia, former smoker.  Get echocardiogram, get coronary CTA. Hyperlipidemia, triglycerides elevated, continue Lipitor 10 mg daily.  Follow-up after cardiac testing.     Medication Adjustments/Labs and Tests Ordered: Current medicines are reviewed at length with the patient today.  Concerns regarding medicines are outlined above.  Orders Placed This Encounter  Procedures   CT CORONARY MORPH W/CTA COR W/SCORE W/CA W/CM &/OR WO/CM   Basic Metabolic Panel (BMET)   EKG 12-Lead   ECHOCARDIOGRAM COMPLETE   Meds ordered this encounter  Medications   metoprolol tartrate (LOPRESSOR) 100 MG tablet    Sig: Take 1 tablet (100 mg total) by mouth once for 1 dose. TAKE 2 HOURS PRIOR TO CARDIAC CT    Dispense:  1 tablet    Refill:  0   ivabradine (CORLANOR) 5 MG TABS tablet    Sig: Take 2 tablets (10 mg total) by mouth once for 1 dose. TAKE TWO HOURS PRIOR TO CARDIAC CTA    Dispense:  2 tablet    Refill:  0    Patient Instructions  Medication Instructions:   Your physician recommends that you continue on your current medications as directed. Please refer to the Current Medication list given to you today.  *If you need a refill on your cardiac  medications before your next appointment, please call your pharmacy*   Lab Work:  Your physician recommends you go to the medical mall for labs - BMP  If you have labs (blood work) drawn today and your tests are completely normal, you will receive your results only by: MyChart Message (if you have MyChart) OR A paper copy in the mail If you have any lab test that is abnormal or we need to change your treatment, we will call you to review the results.   Testing/Procedures:  Your physician has requested that you have an echocardiogram. Echocardiography is a painless test that uses sound waves to create images of your heart. It provides your doctor with information about the size and shape of your heart and how well your heart's chambers and valves are working. This procedure takes approximately one hour. There are no restrictions for this procedure. Please do NOT wear cologne, perfume, aftershave, or lotions (deodorant is allowed). Please arrive 15 minutes prior to your appointment time.    Your cardiac CT will be scheduled at one of the below locations:   [facility] [address] [place], [place] [address] [phone]  OR   [facility] [address] [place], [place] [address] [phone]  If scheduled at [facility] or [facility], please arrive 15 mins early for check-in and test prep.   Please follow these instructions carefully (unless otherwise directed):  Hold all erectile dysfunction medications at least 3 days (72 hrs) prior to test. (Ie viagra, cialis, sildenafil, tadalafil, etc) We will administer nitroglycerin during this exam.   On the Night Before the Test: Be sure to Drink plenty of water. Do not consume any caffeinated/decaffeinated beverages or chocolate 12 hours prior to your test. Do not take any antihistamines 12 hours prior to your  test.  On the Day of the Test: Drink plenty of  water until 1 hour prior to the test. Do not eat any food 1 hour prior to test. You may take your regular medications prior to the test.  Take metoprolol (Lopressor) two hours prior to test. Take Corlanor two hours prior to cardiac ct  After the Test: Drink plenty of water. After receiving IV contrast, you may experience a mild flushed feeling. This is normal. On occasion, you may experience a mild rash up to 24 hours after the test. This is not dangerous. If this occurs, you can take Benadryl 25 mg and increase your fluid intake. If you experience trouble breathing, this can be serious. If it is severe call 911 IMMEDIATELY. If it is mild, please call our office. If you take any of these medications: Glipizide/Metformin, Avandament, Glucavance, please do not take 48 hours after completing test unless otherwise instructed.  For scheduling needs, including cancellations and rescheduling, please call [place], [phone].    Follow-Up: At [facility], you and your health needs are our priority.  As part of our continuing mission to provide you with exceptional heart care, we have created designated Provider Care Teams.  These Care Teams include your primary Cardiologist (physician) and Advanced Practice Providers (APPs -  Physician Assistants and Nurse Practitioners) who all work together to provide you with the care you need, when you need it.  We recommend signing up for the patient portal called "MyChart".  Sign up information is provided on this After Visit Summary.  MyChart is used to connect with patients for Virtual Visits (Telemedicine).  Patients are able to view lab/test results, encounter notes, upcoming appointments, etc.  Non-urgent messages can be sent to your provider as well.   To learn more about what you can do with MyChart, go to [email or web address].    Your next appointment:    After testing  Provider:    You may see Debbe Odea, MD or one of the following Advanced Practice Providers on your designated Care Team:   Nicolasa Ducking, NP Eula Listen, PA-C Cadence Fransico Michael, PA-C Charlsie Quest, NP   Signed, Debbe Odea, MD  01/07/2023 12:32 PM    [facility]

## 2023-01-07 NOTE — Patient Instructions (Signed)
Medication Instructions:   Your physician recommends that you continue on your current medications as directed. Please refer to the Current Medication list given to you today.  *If you need a refill on your cardiac medications before your next appointment, please call your pharmacy*   Lab Work:  Your physician recommends you go to the medical mall for labs - BMP  If you have labs (blood work) drawn today and your tests are completely normal, you will receive your results only by: MyChart Message (if you have MyChart) OR A paper copy in the mail If you have any lab test that is abnormal or we need to change your treatment, we will call you to review the results.   Testing/Procedures:  Your physician has requested that you have an echocardiogram. Echocardiography is a painless test that uses sound waves to create images of your heart. It provides your doctor with information about the size and shape of your heart and how well your heart's chambers and valves are working. This procedure takes approximately one hour. There are no restrictions for this procedure. Please do NOT wear cologne, perfume, aftershave, or lotions (deodorant is allowed). Please arrive 15 minutes prior to your appointment time.    Your cardiac CT will be scheduled at one of the below locations:   [facility] [address] [place], [place] [address] [phone]  OR   [facility] [address] [place], [place] [address] [phone]  If scheduled at [facility] or [facility], please arrive 15 mins early for check-in and test prep.   Please follow these instructions carefully (unless otherwise directed):  Hold all erectile dysfunction medications at least 3 days (72 hrs) prior to test. (Ie viagra, cialis, sildenafil, tadalafil, etc) We will administer nitroglycerin during this exam.    On the Night Before the Test: Be sure to Drink plenty of water. Do not consume any caffeinated/decaffeinated beverages or chocolate 12 hours prior to your test. Do not take any antihistamines 12 hours prior to your test.  On the Day of the Test: Drink plenty of water until 1 hour prior to the test. Do not eat any food 1 hour prior to test. You may take your regular medications prior to the test.  Take metoprolol (Lopressor) two hours prior to test. Take Corlanor two hours prior to cardiac ct  After the Test: Drink plenty of water. After receiving IV contrast, you may experience a mild flushed feeling. This is normal. On occasion, you may experience a mild rash up to 24 hours after the test. This is not dangerous. If this occurs, you can take Benadryl 25 mg and increase your fluid intake. If you experience trouble breathing, this can be serious. If it is severe call 911 IMMEDIATELY. If it is mild, please call our office. If you take any of these medications: Glipizide/Metformin, Avandament, Glucavance, please do not take 48 hours after completing test unless otherwise instructed.  For scheduling needs, including cancellations and rescheduling, please call [place], [phone].    Follow-Up: At [facility], you and your health needs are our priority.  As part of our continuing mission to provide you with exceptional heart care, we have created designated Provider Care Teams.  These Care Teams include your primary Cardiologist (physician) and Advanced Practice Providers (APPs -  Physician Assistants and Nurse Practitioners) who all work together to provide you with the care you need, when you need  it.  We recommend signing up for the patient portal called "MyChart".  Sign up information is provided on this After Visit Summary.  MyChart is used to connect with patients for Virtual Visits (Telemedicine).  Patients are able to view lab/test results, encounter notes, upcoming  appointments, etc.  Non-urgent messages can be sent to your provider as well.   To learn more about what you can do with MyChart, go to [email or web address].    Your next appointment:    After testing  Provider:   You may see Debbe Odea, MD or one of the following Advanced Practice Providers on your designated Care Team:   Nicolasa Ducking, NP Eula Listen, PA-C Cadence Fransico Michael, PA-C Charlsie Quest, NP

## 2023-01-22 ENCOUNTER — Ambulatory Visit: Payer: Managed Care, Other (non HMO) | Attending: Cardiology

## 2023-01-22 DIAGNOSIS — I5030 Unspecified diastolic (congestive) heart failure: Secondary | ICD-10-CM | POA: Diagnosis not present

## 2023-01-22 DIAGNOSIS — R072 Precordial pain: Secondary | ICD-10-CM

## 2023-01-22 LAB — ECHOCARDIOGRAM COMPLETE
Area-P 1/2: 3.17 cm2
S' Lateral: 2.4 cm

## 2023-01-29 ENCOUNTER — Ambulatory Visit: Payer: Managed Care, Other (non HMO) | Admitting: Family Medicine

## 2023-02-02 ENCOUNTER — Telehealth: Payer: Self-pay | Admitting: Cardiology

## 2023-02-02 NOTE — Telephone Encounter (Signed)
Returned patients call.   Patient reports that he has misplaced one of the two medications for his Cardiac CT scan. Patient was at work so he was unsure which one that was. Patient stated that he was going to continue looking for it and if he couldn't find it by the day of his Cardiac CT he would call and get [place] to send it back in.   Patient was thankful for the call.

## 2023-02-02 NOTE — Telephone Encounter (Signed)
Called patient to reschedule appointment after CT Patient has questions regarding medication for the CT  Please call to clarify

## 2023-02-05 ENCOUNTER — Ambulatory Visit: Payer: Managed Care, Other (non HMO) | Admitting: Cardiology

## 2023-02-09 ENCOUNTER — Encounter (HOSPITAL_COMMUNITY): Payer: Self-pay

## 2023-02-10 ENCOUNTER — Telehealth (HOSPITAL_COMMUNITY): Payer: Self-pay | Admitting: *Deleted

## 2023-02-10 NOTE — Telephone Encounter (Signed)
Reaching out to patient to offer assistance regarding upcoming cardiac imaging study; pt verbalizes understanding of appt date/time, parking situation and where to check in, pre-test NPO status and medications ordered, and verified current allergies; name and call back number provided for further questions should they arise  Larey Brick RN Navigator Cardiac Imaging [facility] [phone] office [phone] cell  Patient to hold his BP medication and take 100mg  metoprolol tartrate two hours prior to his cardiac CT scan.

## 2023-02-11 ENCOUNTER — Ambulatory Visit
Admission: RE | Admit: 2023-02-11 | Discharge: 2023-02-11 | Disposition: A | Discharge status: Home / Self Care | Payer: Managed Care, Other (non HMO) | Source: Ambulatory Visit | Attending: Cardiology | Admitting: Cardiology

## 2023-02-11 DIAGNOSIS — R072 Precordial pain: Secondary | ICD-10-CM

## 2023-02-11 MED ORDER — SODIUM CHLORIDE 0.9 % IV SOLN: INTRAVENOUS | Status: DC

## 2023-02-11 MED ORDER — IOHEXOL 350 MG/ML SOLN: 100.0000 mL | Freq: Once | INTRAVENOUS | Status: DC | PRN

## 2023-02-16 ENCOUNTER — Encounter: Payer: Self-pay | Admitting: Family Medicine

## 2023-02-16 ENCOUNTER — Other Ambulatory Visit: Payer: Self-pay | Admitting: Family Medicine

## 2023-02-16 ENCOUNTER — Ambulatory Visit (INDEPENDENT_AMBULATORY_CARE_PROVIDER_SITE_OTHER): Payer: Managed Care, Other (non HMO) | Admitting: Family Medicine

## 2023-02-16 VITALS — BP 114/68 | HR 89 | Temp 96.4°F | Wt 225.0 lb

## 2023-02-16 DIAGNOSIS — F3342 Major depressive disorder, recurrent, in full remission: Secondary | ICD-10-CM

## 2023-02-16 DIAGNOSIS — E1169 Type 2 diabetes mellitus with other specified complication: Secondary | ICD-10-CM

## 2023-02-16 DIAGNOSIS — K219 Gastro-esophageal reflux disease without esophagitis: Secondary | ICD-10-CM

## 2023-02-16 DIAGNOSIS — Z0000 Encounter for general adult medical examination without abnormal findings: Secondary | ICD-10-CM

## 2023-02-16 DIAGNOSIS — N138 Other obstructive and reflux uropathy: Secondary | ICD-10-CM

## 2023-02-16 LAB — POCT GLYCOSYLATED HEMOGLOBIN (HGB A1C): Hemoglobin A1C: 6.3 % — AB (ref 4.0–5.6)

## 2023-02-16 NOTE — Patient Instructions (Addendum)
Thank you for coming to the office today.  Recent Labs    06/15/22 0926 09/25/22 1557 02/16/23 0818  HGBA1C 9.2* 8.4* 6.3*   Mounjaro 7.5mg , continue dose  Reduce Metformin to half tab = 500mg  twice a day. We can switch to new order 500mg  if need. Future we can discontinue this likely  Reduce Omeprazole to once a day, 40mg  daily.  Please schedule a Follow-up Appointment to: Return in about 4 months (around 06/19/2023) for 4 month fasting lab only then 1 week later Annual Physical.  If you have any other questions or concerns, please feel free to call the office or send a message through MyChart. You may also schedule an earlier appointment if necessary.  Additionally, you may be receiving a survey about your experience at our office within a few days to 1 week by e-mail or mail. We value your feedback.  Saralyn Pilar, DO [facility], [place]

## 2023-02-16 NOTE — Assessment & Plan Note (Signed)
A1c improved to 6.3 Complications - other including hyperlipidemia, GERD, obesity - increases risk of future cardiovascular complications  OFF Bydureon  Plan:  Mounjaro 7.5mg , continue dose Reduce Metformin to half tab = 500mg  twice a day. We can switch to new order 500mg  if need. Future we can discontinue this likely 2. Encourage improved lifestyle - low carb, low sugar diet, reduce portion size, continue improving regular exercise 3. Check CBG , bring log to next visit for review

## 2023-02-16 NOTE — Progress Notes (Signed)
Subjective:    Patient ID: Shawn Gordon, male    DOB: 11-29-1960, 62 y.o.   MRN: [id number]  Shawn Gordon is a 62 y.o. male presenting on 02/16/2023 for Diabetes   HPI  CHRONIC DM, Type 2 Obesity BMI >32 Significant improvement on Mounjaro Last visit 09/2022, weight initially 271 lbs prior to 05/2022 > down over course of 8+ months down 51.3 lbs after he has entirely stopped drinking milk Today weight down to 225 lbs and A1c 6.3 Recently skipped all meds for a few days when was out of town. He had a sugar crash when returned on medications.  Current medications - Mounjaro 7.5mg  inj weekly - Metformin 1000mg  TWICE A DAY with meals He does well with reduced appetite Drinks Ensure regularly to maintain diet   Reports good compliance. Tolerating well w/o side-effects Denies hypoglycemia, polyuria, visual changes, numbness.   Low Back, chronic pain Last X-ray 02/2021 fixed position back pain will increase and worsen Sleep on L side and it can bother or flare up L hip Hip and knee improved w weight loss  GERD He admits he had pizza recently and it triggered some indigestion acid reflux symptoms       02/16/2023    2:59 PM 11/12/2022   10:34 AM 06/22/2022    9:28 AM  Depression screen PHQ 2/9  Decreased Interest 0 1 1  Down, Depressed, Hopeless 0 0 1  PHQ - 2 Score 0 1 2  Altered sleeping 0 0 0  Tired, decreased energy 1 2 1   Change in appetite 1 1 0  Feeling bad or failure about yourself  0 1 0  Trouble concentrating 0 0 2  Moving slowly or fidgety/restless 0 0 0  Suicidal thoughts 0 0 0  PHQ-9 Score 2 5 5   Difficult doing work/chores Not difficult at all  Very difficult    Social History   Tobacco Use   Smoking status: Former    Current packs/day: 0.00    Average packs/day: 2.0 packs/day for 22.9 years (45.7 ttl pk-yrs)    Types: Cigarettes    Start date: 04/19/1978    Quit date: 02/26/2001    Years since quitting: 21.9   Smokeless tobacco: Former   Building services engineer status: Never Used  Substance Use Topics   Alcohol use: No    Alcohol/week: 0.0 standard drinks of alcohol   Drug use: No    Review of Systems Per HPI unless specifically indicated above     Objective:    BP 114/68 (BP Location: Right Arm, Patient Position: Sitting, Cuff Size: Normal)   Pulse 89   Temp (!) 96.4 F (35.8 C) (Temporal)   Wt 225 lb (102.1 kg)   SpO2 98%   BMI 32.75 kg/m   Wt Readings from Last 3 Encounters:  02/16/23 225 lb (102.1 kg)  01/07/23 231 lb 6.4 oz (105 kg)  11/12/22 235 lb (106.6 kg)    Physical Exam Results for orders placed or performed in visit on 02/16/23  POCT glycosylated hemoglobin (Hb A1C)  Result Value Ref Range   Hemoglobin A1C 6.3 (A) 4.0 - 5.6 %      Assessment & Plan:   Problem List Items Addressed This Visit     Type 2 diabetes mellitus with other specified complication (HCC) - Primary    A1c improved to 6.3 Complications - other including hyperlipidemia, GERD, obesity - increases risk of future cardiovascular complications  OFF Bydureon  Plan:  Mounjaro 7.5mg , continue dose Reduce Metformin to half tab = 500mg  twice a day. We can switch to new order 500mg  if need. Future we can discontinue this likely 2. Encourage improved lifestyle - low carb, low sugar diet, reduce portion size, continue improving regular exercise 3. Check CBG , bring log to next visit for review      Relevant Medications   metFORMIN (GLUCOPHAGE) 1000 MG tablet   Other Relevant Orders   POCT glycosylated hemoglobin (Hb A1C) (Completed)   Other Visit Diagnoses     Gastroesophageal reflux disease       Relevant Medications   omeprazole (PRILOSEC) 40 MG capsule       Reduce PPI, from 40mg  TWICE A DAY down to 40mg  daily given improvement clinically, improved GERD w/ weight loss  No orders of the defined types were placed in this encounter.   Follow up plan: Return in about 4 months (around 06/19/2023) for 4 month fasting  lab only then 1 week later Annual Physical.  Future labs ordered for 06/2023 next physical 05/2023 due for labs.   Saralyn Pilar, DO [facility] 02/16/2023, 8:17 AM

## 2023-02-17 ENCOUNTER — Ambulatory Visit: Payer: Managed Care, Other (non HMO) | Admitting: Cardiology

## 2023-02-18 ENCOUNTER — Encounter (HOSPITAL_COMMUNITY): Payer: Self-pay

## 2023-02-19 ENCOUNTER — Telehealth (HOSPITAL_COMMUNITY): Payer: Self-pay | Admitting: Emergency Medicine

## 2023-02-19 NOTE — Telephone Encounter (Signed)
Reaching out to patient to offer assistance regarding upcoming cardiac imaging study; pt verbalizes understanding of appt date/time, parking situation and where to check in, pre-test NPO status and medications ordered, and verified current allergies; name and call back number provided for further questions should they arise Shawn Alexandria RN Navigator Cardiac Imaging [facility] [phone] office [phone] cell  100mg  metoprolol + 10-15mg  ivabradine for HR control Holding daily BP meds Drinking lots water

## 2023-02-22 ENCOUNTER — Ambulatory Visit
Admission: RE | Admit: 2023-02-22 | Discharge: 2023-02-22 | Disposition: A | Discharge status: Home / Self Care | Payer: Managed Care, Other (non HMO) | Source: Ambulatory Visit | Attending: Cardiology | Admitting: Cardiology

## 2023-02-22 DIAGNOSIS — R072 Precordial pain: Secondary | ICD-10-CM | POA: Insufficient documentation

## 2023-02-22 MED ORDER — IOHEXOL 350 MG/ML SOLN
100.0000 mL | Freq: Once | INTRAVENOUS | Status: AC | PRN
  Administered 2023-02-22: 100 mL via INTRAVENOUS

## 2023-02-22 MED ORDER — NITROGLYCERIN 0.4 MG SL SUBL
SUBLINGUAL_TABLET | SUBLINGUAL | Status: AC
  Filled 2023-02-22: qty 1

## 2023-02-22 MED ORDER — NITROGLYCERIN 0.4 MG SL SUBL
0.4000 mg | SUBLINGUAL_TABLET | Freq: Once | SUBLINGUAL | Status: AC
  Administered 2023-02-22: 0.4 mg via SUBLINGUAL
  Filled 2023-02-22: qty 25

## 2023-02-22 NOTE — Progress Notes (Signed)
Patient tolerated procedure well. Ambulate w/o difficulty. Denies any lightheadedness or being dizzy. Pt denies any pain at this time. Sitting in chair. Pt is encouraged to drink additional water throughout the day and reason explained to patient. Patient verbalized understanding and all questions answered. ABC intact. No further needs at this time. Discharge from procedure area w/o issues. 

## 2023-02-24 ENCOUNTER — Ambulatory Visit: Admission: RE | Admit: 2023-02-24 | Payer: Managed Care, Other (non HMO) | Source: Ambulatory Visit

## 2023-02-24 ENCOUNTER — Other Ambulatory Visit: Payer: Managed Care, Other (non HMO)

## 2023-02-25 ENCOUNTER — Encounter: Payer: Self-pay | Admitting: Cardiology

## 2023-02-25 ENCOUNTER — Other Ambulatory Visit: Payer: Self-pay | Admitting: Family Medicine

## 2023-02-25 ENCOUNTER — Ambulatory Visit: Payer: Managed Care, Other (non HMO) | Attending: Cardiology | Admitting: Cardiology

## 2023-02-25 VITALS — BP 92/60 | HR 84 | Ht 69.5 in | Wt 227.2 lb

## 2023-02-25 DIAGNOSIS — I2510 Atherosclerotic heart disease of native coronary artery without angina pectoris: Secondary | ICD-10-CM | POA: Diagnosis not present

## 2023-02-25 DIAGNOSIS — E782 Mixed hyperlipidemia: Secondary | ICD-10-CM

## 2023-02-25 DIAGNOSIS — I2584 Coronary atherosclerosis due to calcified coronary lesion: Secondary | ICD-10-CM

## 2023-02-25 DIAGNOSIS — E1169 Type 2 diabetes mellitus with other specified complication: Secondary | ICD-10-CM | POA: Diagnosis not present

## 2023-02-25 DIAGNOSIS — K219 Gastro-esophageal reflux disease without esophagitis: Secondary | ICD-10-CM

## 2023-02-25 DIAGNOSIS — E785 Hyperlipidemia, unspecified: Secondary | ICD-10-CM

## 2023-02-25 MED ORDER — ATORVASTATIN CALCIUM 20 MG PO TABS: ORAL_TABLET | ORAL | 1 refills | Status: DC

## 2023-02-25 NOTE — Patient Instructions (Signed)
Medication Instructions:   INCREASE Atorvastatin - Take one tablet ( 20mg ) by mouth daily.   *If you need a refill on your cardiac medications before your next appointment, please call your pharmacy*   Lab Work:  Your provider would like for you to return in 6 months (on or around 08/28/2023) to have the following labs drawn: Lipid panel.   Please go to [facility] [address] ([facility]) [address], [place] [address] You do not need an appointment.  They are open from 7:30 am-4 pm.  Lunch from 1:00 pm- 2:00 pm You WILL need to be fasting.     If you have labs (blood work) drawn today and your tests are completely normal, you will receive your results only by: MyChart Message (if you have MyChart) OR A paper copy in the mail If you have any lab test that is abnormal or we need to change your treatment, we will call you to review the results.   Testing/Procedures:  None Ordered   Follow-Up: At [facility], you and your health needs are our priority.  As part of our continuing mission to provide you with exceptional heart care, we have created designated Provider Care Teams.  These Care Teams include your primary Cardiologist (physician) and Advanced Practice Providers (APPs -  Physician Assistants and Nurse Practitioners) who all work together to provide you with the care you need, when you need it.  We recommend signing up for the patient portal called "MyChart".  Sign up information is provided on this After Visit Summary.  MyChart is used to connect with patients for Virtual Visits (Telemedicine).  Patients are able to view lab/test results, encounter notes, upcoming appointments, etc.  Non-urgent messages can be sent to your provider as well.   To learn more about what you can do with MyChart, go to [email or web address].    Your next appointment:   6 month(s)  Provider:   You may see Debbe Odea, MD or one of the following Advanced  Practice Providers on your designated Care Team:   Nicolasa Ducking, NP Eula Listen, PA-C Cadence Fransico Michael, PA-C Charlsie Quest, NP

## 2023-02-25 NOTE — Progress Notes (Signed)
Cardiology Office Note:    Date:  02/25/2023   ID:  Shawn Gordon, DOB Jun 04, 1961, MRN [id number]  PCP:  Smitty Cords, DO   [facility] Providers Cardiologist:  Debbe Odea, MD     Referring MD: Saralyn Pilar *   Chief Complaint  Patient presents with   Follow-up    Patient denies new or acute cardiac problems/concerns today.     History of Present Illness:    Shawn Gordon is a 62 y.o. male with a hx of diabetes, hyperlipidemia, former smoker x 20 years presenting for follow-up.  Previously seen due to chest pain.  Echo and coronary CTA obtained to evaluate cardiac etiology.  He overall feels okay, has been eating healthier, less carbs, losing weight.  Denies any chest pains, feels much better with weight loss.  Presents for cardiac testing results.   Past Medical History:  Diagnosis Date   Arthritis    Lower spine, wrist, hips(pre-replacement), shoulders   Asthma    Back pain    Diabetes mellitus, type 2 (HCC)    GERD (gastroesophageal reflux disease)    Presence of dental prosthetic device    2 implants.  1 top, 1 bottom    Past Surgical History:  Procedure Laterality Date   SHOULDER ARTHROSCOPY WITH SUBACROMIAL DECOMPRESSION AND OPEN ROTATOR C Right 05/17/2020   Procedure: Right shoulder arthroscopic subscapularis repair, mini-open supraspinatus repair, subacromial decompression, distal clavicle excision, and open biceps tenodesis;  Surgeon: Signa Kell, MD;  Location[facility];  Service: Orthopedics;  Laterality: Right;   SHOULDER ARTHROSCOPY WITH SUBACROMIAL DECOMPRESSION, ROTATOR CUFF REPAIR AND BICEP TENDON REPAIR Left 09/01/2019   Procedure: SHOULDER ARTHROSCOPY WITH SUBACROMIAL DECOMPRESSION, ROTATOR CUFF REPAIR AND BICEP TENDON REPAIR, SUBSACAPULAIRS REPAIR, DISTAL CLAVICLE EXCISION;  Surgeon: Signa Kell, MD;  Location: [facility];  Service: Orthopedics;  Laterality: Left;  Diabetic - oral and  injectable meds [name] ASSISTING   TOTAL HIP ARTHROPLASTY Bilateral 2007   Left - 2007, right - 2008    Current Medications: Current Meds  Medication Sig   amoxicillin (AMOXIL) 500 MG capsule Take 500 mg by mouth daily as needed (prior to dental procedures).   B Complex Vitamins (B COMPLEX PO) Take by mouth daily.   buPROPion (WELLBUTRIN XL) 150 MG 24 hr tablet Take 1 tablet (150 mg total) by mouth daily. In morning.   Cholecalciferol (VITAMIN D3 PO) Take by mouth daily.   FLUoxetine (PROZAC) 40 MG capsule Take 1 capsule (40 mg total) by mouth daily.   glucose blood (TRUETEST TEST) test strip Check Blood Sugar 1-2 Times daily.   lisinopril (ZESTRIL) 2.5 MG tablet Take 1 tablet (2.5 mg total) by mouth daily.   metFORMIN (GLUCOPHAGE) 1000 MG tablet Take 0.5 tablets (500 mg total) by mouth 2 (two) times daily with a meal.   MOUNJARO 7.5 MG/0.5ML Pen INJECT 7.5 MG SUBCUTANEOUSLY WEEKLY   omeprazole (PRILOSEC) 40 MG capsule Take 1 capsule (40 mg total) by mouth daily before breakfast.   tamsulosin (FLOMAX) 0.4 MG CAPS capsule Take 1 capsule (0.4 mg total) by mouth daily after breakfast.   [DISCONTINUED] atorvastatin (LIPITOR) 10 MG tablet TAKE 1 TABLET BY MOUTH EVERYDAY AT BEDTIME     Allergies:   Patient has no known allergies.   Social History   Socioeconomic History   Marital status: Single    Spouse name: Not on file   Number of children: Not on file   Years of education: Not on file  Highest education level: Not on file  Occupational History   Occupation: Engineer, structural    Comment: Night-shift  Tobacco Use   Smoking status: Former    Current packs/day: 0.00    Average packs/day: 2.0 packs/day for 22.9 years (45.7 ttl pk-yrs)    Types: Cigarettes    Start date: 04/19/1978    Quit date: 02/26/2001    Years since quitting: 22.0   Smokeless tobacco: Former  Building services engineer status: Never Used  Substance and Sexual Activity   Alcohol use: No    Alcohol/week:  0.0 standard drinks of alcohol   Drug use: No   Sexual activity: Not on file  Other Topics Concern   Not on file  Social History Narrative   Not on file   Social Determinants of Health   Financial Resource Strain: Not on file  Food Insecurity: Not on file  Transportation Needs: Not on file  Physical Activity: Not on file  Stress: Not on file  Social Connections: Not on file     Family History: The patient's family history includes Cancer in his father; Heart disease in his mother; Hypertension in his paternal grandfather; Stroke in his father and mother.  ROS:   Please see the history of present illness.     All other systems reviewed and are negative.  EKGs/Labs/Other Studies Reviewed:          Recent Labs: 06/15/2022: ALT 38; Hemoglobin 12.8; Platelets 338; TSH 1.13 01/07/2023: BUN 22; Creatinine, Ser 0.76; Potassium 4.3; Sodium 133  Recent Lipid Panel    Component Value Date/Time   CHOL 164 06/15/2022 0926   TRIG 196 (H) 06/15/2022 0926   HDL 43 06/15/2022 0926   CHOLHDL 3.8 06/15/2022 0926   VLDL 31 (H) 01/14/2016 1647   LDLCALC 92 06/15/2022 0926     Risk Assessment/Calculations:              Physical Exam:    VS:  BP 92/60 (BP Location: Left Arm, Patient Position: Sitting, Cuff Size: Large)   Pulse 84   Ht 5' 9.5" (1.765 m)   Wt 227 lb 3.2 oz (103.1 kg)   SpO2 96%   BMI 33.07 kg/m     Wt Readings from Last 3 Encounters:  02/25/23 227 lb 3.2 oz (103.1 kg)  02/16/23 225 lb (102.1 kg)  01/07/23 231 lb 6.4 oz (105 kg)     GEN:  Well nourished, well developed in no acute distress HEENT: Normal NECK: No JVD; No carotid bruits CARDIAC: RRR, no murmurs, rubs, gallops RESPIRATORY:  Clear to auscultation without rales, wheezing or rhonchi  ABDOMEN: Soft, non-tender, non-distended MUSCULOSKELETAL:  No edema; No deformity  SKIN: Warm and dry NEUROLOGIC:  Alert and oriented x 3 PSYCHIATRIC:  Normal affect   ASSESSMENT:    1. Coronary artery  calcification   2. Mixed hyperlipidemia   3. Hyperlipidemia associated with type 2 diabetes mellitus (HCC)    PLAN:    In order of problems listed above:  Minimal coronary calcifications, coronary CT 8/24 with calcium score 45.6, minimal LAD and left circumflex stenosis<25%.  Echo 7/24 EF 55 to 60%. Hyperlipidemia, triglycerides elevated, LDL not at goal.  Increase Lipitor to 20 mg daily.  Repeat lipid panel in 6 months.  Continue low-cholesterol diet, increase activity, weight loss.  Follow-up in 6 months.     Medication Adjustments/Labs and Tests Ordered: Current medicines are reviewed at length with the patient today.  Concerns regarding medicines are outlined above.  Orders Placed This Encounter  Procedures   Lipid panel   Meds ordered this encounter  Medications   atorvastatin (LIPITOR) 20 MG tablet    Sig: TAKE 1 TABLET BY MOUTH EVERYDAY AT BEDTIME    Dispense:  90 tablet    Refill:  1    Patient Instructions  Medication Instructions:   INCREASE Atorvastatin - Take one tablet ( 20mg ) by mouth daily.   *If you need a refill on your cardiac medications before your next appointment, please call your pharmacy*   Lab Work:  Your provider would like for you to return in 6 months (on or around 08/28/2023) to have the following labs drawn: Lipid panel.   Please go to [facility] [address] ([facility]) [address], [place] [address] You do not need an appointment.  They are open from 7:30 am-4 pm.  Lunch from 1:00 pm- 2:00 pm You WILL need to be fasting.     If you have labs (blood work) drawn today and your tests are completely normal, you will receive your results only by: MyChart Message (if you have MyChart) OR A paper copy in the mail If you have any lab test that is abnormal or we need to change your treatment, we will call you to review the results.   Testing/Procedures:  None Ordered   Follow-Up: At [facility], you and  your health needs are our priority.  As part of our continuing mission to provide you with exceptional heart care, we have created designated Provider Care Teams.  These Care Teams include your primary Cardiologist (physician) and Advanced Practice Providers (APPs -  Physician Assistants and Nurse Practitioners) who all work together to provide you with the care you need, when you need it.  We recommend signing up for the patient portal called "MyChart".  Sign up information is provided on this After Visit Summary.  MyChart is used to connect with patients for Virtual Visits (Telemedicine).  Patients are able to view lab/test results, encounter notes, upcoming appointments, etc.  Non-urgent messages can be sent to your provider as well.   To learn more about what you can do with MyChart, go to [email or web address].    Your next appointment:   6 month(s)  Provider:   You may see Debbe Odea, MD or one of the following Advanced Practice Providers on your designated Care Team:   Nicolasa Ducking, NP Eula Listen, PA-C Cadence Fransico Michael, PA-C Charlsie Quest, NP   Signed, Debbe Odea, MD  02/25/2023 10:06 AM    [facility]

## 2023-02-26 NOTE — Telephone Encounter (Signed)
Requested medication (s) are due for refill today: routing for review  Requested medication (s) are on the active medication list: yes  Last refill:  02/16/23  Future visit scheduled: yes  Notes to clinic:  Unable to refill per protocol, historical medication. Routing for approval.     Requested Prescriptions  Pending Prescriptions Disp Refills   omeprazole (PRILOSEC) 40 MG capsule [Pharmacy Med Name: OMEPRAZOLE DR 40 MG CAPSULE] 180 capsule     Sig: TAKE 1 CAPSULE (40 MG TOTAL) BY MOUTH 2 (TWO) TIMES DAILY BEFORE A MEAL.     Gastroenterology: Proton Pump Inhibitors Passed - 02/25/2023  2:34 AM      Passed - Valid encounter within last 12 months    Recent Outpatient Visits           1 week ago Type 2 diabetes mellitus with other specified complication, without long-term current use of insulin [facility])   [place] [facility] [place], Netta Neat, DO   3 months ago Atypical chest pain   [place] [facility], Netta Neat, DO   5 months ago Type 2 diabetes mellitus with other specified complication, without long-term current use of insulin [facility])   [place] [facility] Smitty Cords, DO   8 months ago Annual physical exam   [place] [facility] Smitty Cords, DO   1 year ago Major depressive disorder, recurrent, moderate (HCC)   [place] [facility] Smitty Cords, DO       Future Appointments             In 4 months Althea Charon, Netta Neat, DO [place] [facility]

## 2023-03-10 LAB — HM DIABETES EYE EXAM

## 2023-03-11 ENCOUNTER — Encounter: Payer: Self-pay | Admitting: Family Medicine

## 2023-05-10 ENCOUNTER — Other Ambulatory Visit: Payer: Self-pay | Admitting: Family Medicine

## 2023-05-10 DIAGNOSIS — E1169 Type 2 diabetes mellitus with other specified complication: Secondary | ICD-10-CM

## 2023-05-10 DIAGNOSIS — K219 Gastro-esophageal reflux disease without esophagitis: Secondary | ICD-10-CM

## 2023-05-11 NOTE — Telephone Encounter (Signed)
Requested medication (s) are due for refill today: yes , prilosec - historical medication   Requested medication (s) are on the active medication list: yes   Last refill:  prilosec- historical  02/16/23. Mounjaro- 11/30/22 #6 ml 1 refill  Future visit scheduled: yes in 1 month   Notes to clinic:  historical medication- prilosec, medication not assigned to a protocol- mounjaro . Do you want to order and refill Rxs?     Requested Prescriptions  Pending Prescriptions Disp Refills   omeprazole (PRILOSEC) 40 MG capsule [Pharmacy Med Name: OMEPRAZOLE DR 40 MG CAPSULE] 180 capsule     Sig: TAKE 1 CAPSULE (40 MG TOTAL) BY MOUTH 2 (TWO) TIMES DAILY BEFORE A MEAL.     Gastroenterology: Proton Pump Inhibitors Passed - 05/10/2023 11:40 AM      Passed - Valid encounter within last 12 months    Recent Outpatient Visits           2 months ago Type 2 diabetes mellitus with other specified complication, without long-term current use of insulin [facility])   [place] [facility] [place], Netta Neat, DO   6 months ago Atypical chest pain   [place] [facility] [place], Netta Neat, DO   7 months ago Type 2 diabetes mellitus with other specified complication, without long-term current use of insulin [facility])   [place] [facility] Smitty Cords, DO   10 months ago Annual physical exam   [place] [facility] Smitty Cords, DO   1 year ago Major depressive disorder, recurrent, moderate (HCC)   [place] [facility] Smitty Cords, DO       Future Appointments             In 1 month Althea Charon, Netta Neat, DO [place] [facility]             MOUNJARO 7.5 MG/0.5ML Pen [Pharmacy Med Name: MOUNJARO 7.5 MG/0.5 ML PEN]  1    Sig: INJECT 7.5 MG SUBCUTANEOUSLY WEEKLY     Off-Protocol Failed - 05/10/2023 11:40 AM      Failed - Medication not  assigned to a protocol, review manually.      Passed - Valid encounter within last 12 months    Recent Outpatient Visits           2 months ago Type 2 diabetes mellitus with other specified complication, without long-term current use of insulin [facility])   [place] [facility] Smitty Cords, DO   6 months ago Atypical chest pain   [place] [facility] Smitty Cords, DO   7 months ago Type 2 diabetes mellitus with other specified complication, without long-term current use of insulin [facility])   [place] [facility] Smitty Cords, DO   10 months ago Annual physical exam   [place] [facility] Smitty Cords, DO   1 year ago Major depressive disorder, recurrent, moderate (HCC)   [place] [facility] Smitty Cords, DO       Future Appointments             In 1 month Althea Charon, Netta Neat, DO [place] [facility], [facility]

## 2023-06-16 ENCOUNTER — Other Ambulatory Visit: Payer: Managed Care, Other (non HMO)

## 2023-06-21 ENCOUNTER — Encounter: Payer: Managed Care, Other (non HMO) | Admitting: Family Medicine

## 2023-06-23 ENCOUNTER — Other Ambulatory Visit: Payer: Managed Care, Other (non HMO)

## 2023-06-23 DIAGNOSIS — E1169 Type 2 diabetes mellitus with other specified complication: Secondary | ICD-10-CM

## 2023-06-23 DIAGNOSIS — Z0000 Encounter for general adult medical examination without abnormal findings: Secondary | ICD-10-CM

## 2023-06-23 DIAGNOSIS — F3342 Major depressive disorder, recurrent, in full remission: Secondary | ICD-10-CM

## 2023-06-23 DIAGNOSIS — N138 Other obstructive and reflux uropathy: Secondary | ICD-10-CM

## 2023-06-23 DIAGNOSIS — M47816 Spondylosis without myelopathy or radiculopathy, lumbar region: Secondary | ICD-10-CM

## 2023-06-23 DIAGNOSIS — G8929 Other chronic pain: Secondary | ICD-10-CM

## 2023-06-24 LAB — PSA: PSA: 1.28 ng/mL (ref <=4.00)

## 2023-06-24 LAB — COMPLETE METABOLIC PANEL WITH GFR
AG Ratio: 1.5 (calc) (ref 1.0–2.5)
ALT: 21 U/L (ref 9–46)
AST: 16 U/L (ref 10–35)
Albumin: 4.5 g/dL (ref 3.6–5.1)
Alkaline phosphatase (APISO): 82 U/L (ref 35–144)
BUN: 25 mg/dL (ref 7–25)
CO2: 29 mmol/L (ref 20–32)
Calcium: 10.1 mg/dL (ref 8.6–10.3)
Chloride: 99 mmol/L (ref 98–110)
Creat: 0.76 mg/dL (ref 0.70–1.35)
Globulin: 3.1 g/dL (ref 1.9–3.7)
Glucose, Bld: 113 mg/dL — ABNORMAL HIGH (ref 65–99)
Potassium: 4.6 mmol/L (ref 3.5–5.3)
Sodium: 136 mmol/L (ref 135–146)
Total Bilirubin: 0.6 mg/dL (ref 0.2–1.2)
Total Protein: 7.6 g/dL (ref 6.1–8.1)
eGFR: 101 mL/min/{1.73_m2} (ref >=60)

## 2023-06-24 LAB — CBC WITH DIFFERENTIAL/PLATELET
Absolute Lymphocytes: 1190 {cells}/uL (ref 850–3900)
Absolute Monocytes: 448 {cells}/uL (ref 200–950)
Basophils Absolute: 32 {cells}/uL (ref 0–200)
Basophils Relative: 0.5 %
Eosinophils Absolute: 288 {cells}/uL (ref 15–500)
Eosinophils Relative: 4.5 %
HCT: 43 % (ref 38.5–50.0)
Hemoglobin: 14 g/dL (ref 13.2–17.1)
MCH: 28.3 pg (ref 27.0–33.0)
MCHC: 32.6 g/dL (ref 32.0–36.0)
MCV: 86.9 fL (ref 80.0–100.0)
MPV: 10.6 fL (ref 7.5–12.5)
Monocytes Relative: 7 %
Neutro Abs: 4442 {cells}/uL (ref 1500–7800)
Neutrophils Relative %: 69.4 %
Platelets: 305 10*3/uL (ref 140–400)
RBC: 4.95 10*6/uL (ref 4.20–5.80)
RDW: 13.1 % (ref 11.0–15.0)
Total Lymphocyte: 18.6 %
WBC: 6.4 10*3/uL (ref 3.8–10.8)

## 2023-06-24 LAB — LIPID PANEL
Cholesterol: 100 mg/dL (ref <200)
HDL: 48 mg/dL (ref >=40)
LDL Cholesterol (Calc): 37 mg/dL
Non-HDL Cholesterol (Calc): 52 mg/dL (ref <130)
Total CHOL/HDL Ratio: 2.1 (calc) (ref <5.0)
Triglycerides: 66 mg/dL (ref <150)

## 2023-06-24 LAB — HEMOGLOBIN A1C
Hgb A1c MFr Bld: 6.6 %{Hb} — ABNORMAL HIGH (ref <5.7)
Mean Plasma Glucose: 143 mg/dL
eAG (mmol/L): 7.9 mmol/L

## 2023-06-24 LAB — TSH: TSH: 0.86 m[IU]/L (ref 0.40–4.50)

## 2023-06-24 LAB — EXTRA SPECIMEN

## 2023-06-28 ENCOUNTER — Encounter: Payer: Self-pay | Admitting: Family Medicine

## 2023-06-28 ENCOUNTER — Ambulatory Visit (INDEPENDENT_AMBULATORY_CARE_PROVIDER_SITE_OTHER): Payer: Managed Care, Other (non HMO) | Admitting: Family Medicine

## 2023-06-28 VITALS — BP 110/66 | Ht 69.5 in | Wt 217.0 lb

## 2023-06-28 DIAGNOSIS — N138 Other obstructive and reflux uropathy: Secondary | ICD-10-CM

## 2023-06-28 DIAGNOSIS — Z0000 Encounter for general adult medical examination without abnormal findings: Secondary | ICD-10-CM | POA: Diagnosis not present

## 2023-06-28 DIAGNOSIS — E1169 Type 2 diabetes mellitus with other specified complication: Secondary | ICD-10-CM

## 2023-06-28 DIAGNOSIS — N401 Enlarged prostate with lower urinary tract symptoms: Secondary | ICD-10-CM | POA: Diagnosis not present

## 2023-06-28 DIAGNOSIS — E785 Hyperlipidemia, unspecified: Secondary | ICD-10-CM

## 2023-06-28 DIAGNOSIS — F3342 Major depressive disorder, recurrent, in full remission: Secondary | ICD-10-CM | POA: Insufficient documentation

## 2023-06-28 MED ORDER — MOUNJARO 10 MG/0.5ML ~~LOC~~ SOAJ: 10.0000 mg | SUBCUTANEOUS | 1 refills | Status: DC

## 2023-06-28 NOTE — Assessment & Plan Note (Signed)
Controlled On Wellbutrin

## 2023-06-28 NOTE — Assessment & Plan Note (Signed)
Controlled cholesterol on statin and lifestyle Last lipid panel 06/2023   Plan: 1. Continue current meds - Atorvastatin 10mg  2. Encourage improved lifestyle - low carb/cholesterol, reduce portion size, continue improving regular exercise

## 2023-06-28 NOTE — Patient Instructions (Addendum)
Thank you for coming to the office today.  Recent Labs    09/25/22 1557 02/16/23 0818 06/23/23 0816  HGBA1C 8.4* 6.3* 6.6*   Dose increase Mounjaro from 7.5 up to 10mg  new order.  Great job with labs and cholesterol  Check into [facility] - Dr Gala Lewandowsky for follow up on inserts.  Please schedule a Follow-up Appointment to: Return in about 6 months (around 12/27/2023) for 6 month DM A1c.  If you have any other questions or concerns, please feel free to call the office or send a message through MyChart. You may also schedule an earlier appointment if necessary.  Additionally, you may be receiving a survey about your experience at our office within a few days to 1 week by e-mail or mail. We value your feedback.  Saralyn Pilar, DO [facility], [place]

## 2023-06-28 NOTE — Progress Notes (Signed)
Subjective:    Patient ID: SRITHAN BLEAZARD, male    DOB: 09-24-1960, 62 y.o.   MRN: [id number]  Shawn Gordon is a 62 y.o. male presenting on 06/28/2023 for Annual Exam   HPI  Discussed the use of AI scribe software for clinical note transcription with the patient, who gave verbal consent to proceed.       CHRONIC DM, Type 2 Obesity BMI >31 Significant improvement on Mounjaro A1c at 6.6, prior 6.3 Now on lower dose Metformin Weight down 10 lbs from 227 to 217 lbs in past 3-4 months, home weight 214.6 lbs, improvement Current medications - Mounjaro 7.5mg  inj weekly - Metformin 500mg  TWICE A DAY with meals He does well with reduced appetite Drinks Ensure regularly to maintain diet   Reports good compliance. Tolerating well w/o side-effects Denies hypoglycemia, polyuria, visual changes, numbness.   Low Back, chronic pain Last X-ray 02/2021 fixed position back pain will increase and worsen Sleep on L side and it can bother or flare up L hip Hip and knee improved w weight loss   Hemoglobin up to 14.0, prior 12.4-12.8  HYPERLIPIDEMIA: - Reports no concerns. Last lipid panel 06/2023, controlled very well on medicine and wt loss TC 100, LDL 37, TG 66 - Currently taking Atorvastatin 20mg , tolerating well without side effects or myalgias   GERD He admits he had pizza recently and it triggered some indigestion acid reflux symptoms  He will catch up with Podiatry for future, new 2+ pairs of shoe inserts due to high arches  S/p bilateral cataract and lasik surgery 04/2023, 05/2023 Using artificial tears  Health Maintenance: Decline Vaccines today  Cologuard 07/2022, negative, next 07/2025  PSA 1.28 (06/2023)     06/28/2023    8:23 AM 02/16/2023    2:59 PM 11/12/2022   10:34 AM  Depression screen PHQ 2/9  Decreased Interest 0 0 1  Down, Depressed, Hopeless 0 0 0  PHQ - 2 Score 0 0 1  Altered sleeping 1 0 0  Tired, decreased energy  1 2  Change in appetite 0 1 1   Feeling bad or failure about yourself  1 0 1  Trouble concentrating 2 0 0  Moving slowly or fidgety/restless 1 0 0  Suicidal thoughts 0 0 0  PHQ-9 Score 5 2 5   Difficult doing work/chores  Not difficult at all        06/28/2023    8:23 AM 02/16/2023    2:59 PM 11/12/2022   10:34 AM 06/22/2022    9:28 AM  GAD 7 : Generalized Anxiety Score  Nervous, Anxious, on Edge 1 0 0 0  Control/stop worrying 0 0 0 0  Worry too much - different things 2 0 1 0  Trouble relaxing 1 0 1 1  Restless 0 0 0 0  Easily annoyed or irritable 2 0 1 0  Afraid - awful might happen 0 0 0 0  Total GAD 7 Score 6 0 3 1  Anxiety Difficulty  Not difficult at all  Somewhat difficult    Social History   Tobacco Use   Smoking status: Former    Current packs/day: 0.00    Average packs/day: 2.0 packs/day for 22.9 years (45.7 ttl pk-yrs)    Types: Cigarettes    Start date: 04/19/1978    Quit date: 02/26/2001    Years since quitting: 22.3   Smokeless tobacco: Former  Building services engineer status: Never Used  Substance Use Topics  Alcohol use: No    Alcohol/week: 0.0 standard drinks of alcohol   Drug use: No    Review of Systems  Constitutional:  Negative for activity change, appetite change, chills, diaphoresis, fatigue and fever.  HENT:  Negative for congestion and hearing loss.   Eyes:  Negative for visual disturbance.  Respiratory:  Negative for cough, chest tightness, shortness of breath and wheezing.   Cardiovascular:  Negative for chest pain, palpitations and leg swelling.  Gastrointestinal:  Negative for abdominal pain, constipation, diarrhea, nausea and vomiting.  Genitourinary:  Negative for dysuria, frequency and hematuria.  Musculoskeletal:  Negative for arthralgias and neck pain.  Skin:  Negative for rash.  Neurological:  Negative for dizziness, weakness, light-headedness, numbness and headaches.  Hematological:  Negative for adenopathy.  Psychiatric/Behavioral:  Negative for behavioral  problems, dysphoric mood and sleep disturbance.    Per HPI unless specifically indicated above     Objective:    BP 110/66   Ht 5' 9.5" (1.765 m)   Wt 217 lb (98.4 kg)   BMI 31.59 kg/m   Wt Readings from Last 3 Encounters:  06/28/23 217 lb (98.4 kg)  02/25/23 227 lb 3.2 oz (103.1 kg)  02/16/23 225 lb (102.1 kg)    Physical Exam Vitals and nursing note reviewed.  Constitutional:      General: He is not in acute distress.    Appearance: He is well-developed. He is not diaphoretic.     Comments: Well-appearing, comfortable, cooperative  HENT:     Head: Normocephalic and atraumatic.  Eyes:     General:        Right eye: No discharge.        Left eye: No discharge.     Conjunctiva/sclera: Conjunctivae normal.     Pupils: Pupils are equal, round, and reactive to light.  Neck:     Thyroid: No thyromegaly.     Vascular: No carotid bruit.  Cardiovascular:     Rate and Rhythm: Normal rate and regular rhythm.     Pulses: Normal pulses.     Heart sounds: Normal heart sounds. No murmur heard. Pulmonary:     Effort: Pulmonary effort is normal. No respiratory distress.     Breath sounds: Normal breath sounds. No wheezing or rales.  Abdominal:     General: Bowel sounds are normal. There is no distension.     Palpations: Abdomen is soft. There is no mass.     Tenderness: There is no abdominal tenderness.  Musculoskeletal:        General: No tenderness. Normal range of motion.     Cervical back: Normal range of motion and neck supple.     Right lower leg: No edema.     Left lower leg: No edema.     Comments: Upper / Lower Extremities: - Normal muscle tone, strength bilateral upper extremities 5/5, lower extremities 5/5  Lymphadenopathy:     Cervical: No cervical adenopathy.  Skin:    General: Skin is warm and dry.     Findings: No erythema or rash.  Neurological:     Mental Status: He is alert and oriented to person, place, and time.     Comments: Distal sensation intact to  light touch all extremities  Psychiatric:        Mood and Affect: Mood normal.        Behavior: Behavior normal.        Thought Content: Thought content normal.     Comments: Well groomed, good  eye contact, normal speech and thoughts     Diabetic Foot Exam - Simple   Simple Foot Form Diabetic Foot exam was performed with the following findings: Yes 06/28/2023  8:59 AM  Visual Inspection See comments: Yes Sensation Testing Intact to touch and monofilament testing bilaterally: Yes Pulse Check Posterior Tibialis and Dorsalis pulse intact bilaterally: Yes Comments Bilateral high arches, some slight discoloration of nails, some varicose veins, callus formation, bunions      Results for orders placed or performed in visit on 06/23/23  TSH  Result Value Ref Range   TSH 0.86 0.40 - 4.50 mIU/L  PSA  Result Value Ref Range   PSA 1.28 < OR = 4.00 ng/mL  Lipid panel  Result Value Ref Range   Cholesterol 100 <200 mg/dL   HDL 48 > OR = 40 mg/dL   Triglycerides 66 <161 mg/dL   LDL Cholesterol (Calc) 37 mg/dL (calc)   Total CHOL/HDL Ratio 2.1 <5.0 (calc)   Non-HDL Cholesterol (Calc) 52 <096 mg/dL (calc)  CBC with Differential/Platelet  Result Value Ref Range   WBC 6.4 3.8 - 10.8 Thousand/uL   RBC 4.95 4.20 - 5.80 Million/uL   Hemoglobin 14.0 13.2 - 17.1 g/dL   HCT 04.5 40.9 - 81.1 %   MCV 86.9 80.0 - 100.0 fL   MCH 28.3 27.0 - 33.0 pg   MCHC 32.6 32.0 - 36.0 g/dL   RDW 91.4 78.2 - 95.6 %   Platelets 305 140 - 400 Thousand/uL   MPV 10.6 7.5 - 12.5 fL   Neutro Abs 4,442 1,500 - 7,800 cells/uL   Absolute Lymphocytes 1,190 850 - 3,900 cells/uL   Absolute Monocytes 448 200 - 950 cells/uL   Eosinophils Absolute 288 15 - 500 cells/uL   Basophils Absolute 32 0 - 200 cells/uL   Neutrophils Relative % 69.4 %   Total Lymphocyte 18.6 %   Monocytes Relative 7.0 %   Eosinophils Relative 4.5 %   Basophils Relative 0.5 %  Hemoglobin A1c  Result Value Ref Range   Hgb A1c MFr Bld 6.6 (H)  <5.7 % of total Hgb   Mean Plasma Glucose 143 mg/dL   eAG (mmol/L) 7.9 mmol/L  COMPLETE METABOLIC PANEL WITH GFR  Result Value Ref Range   Glucose, Bld 113 (H) 65 - 99 mg/dL   BUN 25 7 - 25 mg/dL   Creat 2.13 0.86 - 5.78 mg/dL   eGFR 469 > OR = 60 GE/XBM/8.41L2   BUN/Creatinine Ratio SEE NOTE: 6 - 22 (calc)   Sodium 136 135 - 146 mmol/L   Potassium 4.6 3.5 - 5.3 mmol/L   Chloride 99 98 - 110 mmol/L   CO2 29 20 - 32 mmol/L   Calcium 10.1 8.6 - 10.3 mg/dL   Total Protein 7.6 6.1 - 8.1 g/dL   Albumin 4.5 3.6 - 5.1 g/dL   Globulin 3.1 1.9 - 3.7 g/dL (calc)   AG Ratio 1.5 1.0 - 2.5 (calc)   Total Bilirubin 0.6 0.2 - 1.2 mg/dL   Alkaline phosphatase (APISO) 82 35 - 144 U/L   AST 16 10 - 35 U/L   ALT 21 9 - 46 U/L  Extra Specimen  Result Value Ref Range   Extra tube recieved     Specimen type recieved XS       Assessment & Plan:   Problem List Items Addressed This Visit     BPH with obstruction/lower urinary tract symptoms   Hyperlipidemia associated with type 2 diabetes mellitus (HCC)  Controlled cholesterol on statin and lifestyle Last lipid panel 06/2023   Plan: 1. Continue current meds - Atorvastatin 10mg  2. Encourage improved lifestyle - low carb/cholesterol, reduce portion size, continue improving regular exercise      Relevant Medications   ivabradine (CORLANOR) 5 MG TABS tablet   MOUNJARO 10 MG/0.5ML Pen   Major depressive disorder, recurrent, in full remission (HCC)    Controlled On Wellbutrin      Morbid obesity (HCC)   Relevant Medications   MOUNJARO 10 MG/0.5ML Pen   Type 2 diabetes mellitus with other specified complication (HCC)    A1c 6.6, stable Complications - other including hyperlipidemia, GERD, obesity - increases risk of future cardiovascular complications  OFF Bydureon  Plan:  Mounjaro dose inc from 7.5mg  up to 10mg  weekly On Metformin 500mg  (lower dose now) Encourage improved lifestyle - low carb, low sugar diet, reduce portion size,  continue improving regular exercise Check CBG , bring log to next visit for review DM Foot Urine microalbumin      Relevant Medications   MOUNJARO 10 MG/0.5ML Pen   Other Relevant Orders   Urine Microalbumin w/creat. ratio   Other Visit Diagnoses     Annual physical exam    -  Primary        Foot Health Patient reports cold feet and high arches. No significant findings on foot examination. -Continue regular foot checks. -Consult with podiatry for shoe inserts.  Eye Health Recent cataract and LASIK surgeries. Patient reports improved vision but experiences sensation similar to wearing gas permeable contact lenses. -Continue current eye care regimen including artificial tears for right eye.  Nail Discoloration Possible psoriasis of the toenails. Consider rx management in future. -Consider over-the-counter supplements for hair, skin, and nails.  General Health Maintenance -Continue regular colon screenings (next due in 2027). -Continue regular eye exams. -Follow-up in 6 months.         Orders Placed This Encounter  Procedures   Urine Microalbumin w/creat. ratio    Meds ordered this encounter  Medications   MOUNJARO 10 MG/0.5ML Pen    Sig: Inject 10 mg into the skin once a week.    Dispense:  6 mL    Refill:  1    Dose increase from 7.5 up to 10mg     Follow up plan: Return in about 6 months (around 12/27/2023) for 6 month DM A1c.   Shawn Pilar, DO [facility] 06/28/2023, 8:32 AM

## 2023-06-28 NOTE — Assessment & Plan Note (Addendum)
A1c 6.6, stable Complications - other including hyperlipidemia, GERD, obesity - increases risk of future cardiovascular complications  OFF Bydureon  Plan:  Mounjaro dose inc from 7.5mg  up to 10mg  weekly On Metformin 500mg  (lower dose now) Encourage improved lifestyle - low carb, low sugar diet, reduce portion size, continue improving regular exercise Check CBG , bring log to next visit for review DM Foot Urine microalbumin

## 2023-06-29 LAB — MICROALBUMIN / CREATININE URINE RATIO
Creatinine, Urine: 115 mg/dL (ref 20–320)
Microalb Creat Ratio: 5 mg/g{creat} (ref <30)
Microalb, Ur: 0.6 mg/dL

## 2023-07-26 ENCOUNTER — Other Ambulatory Visit: Payer: Self-pay | Admitting: Family Medicine

## 2023-07-26 ENCOUNTER — Other Ambulatory Visit: Payer: Self-pay

## 2023-07-26 DIAGNOSIS — E785 Hyperlipidemia, unspecified: Secondary | ICD-10-CM

## 2023-07-26 DIAGNOSIS — E1169 Type 2 diabetes mellitus with other specified complication: Secondary | ICD-10-CM

## 2023-07-26 MED ORDER — ATORVASTATIN CALCIUM 20 MG PO TABS: ORAL_TABLET | ORAL | 0 refills | Status: DC

## 2023-07-26 NOTE — Telephone Encounter (Signed)
 Requested Prescriptions   Signed Prescriptions Disp Refills   atorvastatin  (LIPITOR) 20 MG tablet 90 tablet 0    Sig: TAKE 1 TABLET BY MOUTH EVERYDAY AT BEDTIME--due follow up visit. PLEASE CALL OFFICE TO SCHEDULE APPOINTMENT PRIOR TO NEXT REFILL    Authorizing Provider: DARLISS ROGUE    Ordering User: CLAUDENE POWELL CROME

## 2023-07-26 NOTE — Telephone Encounter (Signed)
  Last office visit: 02/25/23 with plan to f/u in 6 months. next office visit: none/active recall  Please schedule f/u appt.  Thanks!

## 2023-07-27 ENCOUNTER — Other Ambulatory Visit: Payer: Self-pay | Admitting: Family Medicine

## 2023-07-27 DIAGNOSIS — N138 Other obstructive and reflux uropathy: Secondary | ICD-10-CM

## 2023-07-27 DIAGNOSIS — F3342 Major depressive disorder, recurrent, in full remission: Secondary | ICD-10-CM

## 2023-07-27 NOTE — Telephone Encounter (Signed)
 Requested Prescriptions  Pending Prescriptions Disp Refills   lisinopril  (ZESTRIL ) 2.5 MG tablet [Pharmacy Med Name: LISINOPRIL  2.5 MG TABLET] 90 tablet 1    Sig: TAKE 1 TABLET BY MOUTH EVERY DAY     Cardiovascular:  ACE Inhibitors Passed - 07/27/2023  7:49 PM      Passed - Cr in normal range and within 180 days    Creat  Date Value Ref Range Status  06/23/2023 0.76 0.70 - 1.35 mg/dL Final   Creatinine, Urine  Date Value Ref Range Status  06/28/2023 115 20 - 320 mg/dL Final         Passed - K in normal range and within 180 days    Potassium  Date Value Ref Range Status  06/23/2023 4.6 3.5 - 5.3 mmol/L Final         Passed - Patient is not pregnant      Passed - Last BP in normal range    BP Readings from Last 1 Encounters:  06/28/23 110/66         Passed - Valid encounter within last 6 months    Recent Outpatient Visits           4 weeks ago Annual physical exam   [place] [facility] [place] Marsa PARAS, DO   5 months ago Type 2 diabetes mellitus with other specified complication, without long-term current use of insulin [facility])   [place] [facility] [place] Marsa PARAS, DO   8 months ago Atypical chest pain   [place] [facility] [place] Marsa PARAS, DO   10 months ago Type 2 diabetes mellitus with other specified complication, without long-term current use of insulin [facility])   [place] [facility] [place] Marsa PARAS, DO   1 year ago Annual physical exam   [place] [facility] [place] Marsa PARAS, DO       Future Appointments             In 6 months [place], Marsa PARAS, DO [place] [facility], [facility]

## 2023-07-28 NOTE — Telephone Encounter (Signed)
 Requested medication (s) are due for refill today: Yes  Requested medication (s) are on the active medication list: Yes  Last refill:  06/22/22  Future visit scheduled: Yes  Notes to clinic:  Prescriptions have expired.    Requested Prescriptions  Pending Prescriptions Disp Refills   FLUoxetine  (PROZAC ) 40 MG capsule [Pharmacy Med Name: FLUOXETINE  HCL 40 MG CAPSULE] 90 capsule 3    Sig: Take 1 capsule (40 mg total) by mouth daily.     Psychiatry:  Antidepressants - SSRI Passed - 07/28/2023  1:45 PM      Passed - Completed PHQ-2 or PHQ-9 in the last 360 days      Passed - Valid encounter within last 6 months    Recent Outpatient Visits           1 month ago Annual physical exam   [place] [facility] [place], Marsa PARAS, DO   5 months ago Type 2 diabetes mellitus with other specified complication, without long-term current use of insulin [facility])   [place] [facility] [place] Marsa PARAS, DO   8 months ago Atypical chest pain   [place] [facility] [place] Marsa PARAS, DO   10 months ago Type 2 diabetes mellitus with other specified complication, without long-term current use of insulin [facility])   [place] [facility] [place] Marsa PARAS, DO   1 year ago Annual physical exam   [place] [facility] [place] Marsa PARAS, DO       Future Appointments             In 6 months [place], Marsa PARAS, DO [place] [facility]             tamsulosin  (FLOMAX ) 0.4 MG CAPS capsule [Pharmacy Med Name: TAMSULOSIN  HCL 0.4 MG CAPSULE] 90 capsule 3    Sig: Take 1 capsule (0.4 mg total) by mouth daily after breakfast.     Urology: Alpha-Adrenergic Blocker Passed - 07/28/2023  1:45 PM      Passed - PSA in normal range and within 360 days    PSA  Date Value Ref Range Status  06/23/2023 1.28 < OR = 4.00 ng/mL Final    Comment:    The total PSA  value from this assay system is  standardized against the WHO standard. The test  result will be approximately 20% lower when compared  to the equimolar-standardized total PSA (Beckman  Coulter). Comparison of serial PSA results should be  interpreted with this fact in mind. . This test was performed using the Siemens  chemiluminescent method. Values obtained from  different assay methods cannot be used interchangeably. PSA levels, regardless of value, should not be interpreted as absolute evidence of the presence or absence of disease.          Passed - Last BP in normal range    BP Readings from Last 1 Encounters:  06/28/23 110/66         Passed - Valid encounter within last 12 months    Recent Outpatient Visits           1 month ago Annual physical exam   [place] [facility] [place], Marsa PARAS, DO   5 months ago Type 2 diabetes mellitus with other specified complication, without long-term current use of insulin [facility])   [place] [facility] [place] Marsa PARAS, DO   8 months ago Atypical  chest pain   [place] [facility] [place] Marsa PARAS, DO   10 months ago Type 2 diabetes mellitus with other specified complication, without long-term current use of insulin [facility])   [place] [facility] [place], Marsa PARAS, DO   1 year ago Annual physical exam   [facility] [place] Marsa PARAS, DO       Future Appointments             In 6 months [place], Marsa PARAS, DO [place] [facility], [facility]             buPROPion  (WELLBUTRIN  XL) 150 MG 24 hr tablet [Pharmacy Med Name: BUPROPION  HCL XL 150 MG TABLET] 90 tablet 3    Sig: Take 1 tablet (150 mg total) by mouth daily. In morning.     Psychiatry: Antidepressants - bupropion  Passed - 07/28/2023  1:45 PM      Passed - Cr in normal range and within 360 days    Creat  Date Value  Ref Range Status  06/23/2023 0.76 0.70 - 1.35 mg/dL Final   Creatinine, Urine  Date Value Ref Range Status  06/28/2023 115 20 - 320 mg/dL Final         Passed - AST in normal range and within 360 days    AST  Date Value Ref Range Status  06/23/2023 16 10 - 35 U/L Final         Passed - ALT in normal range and within 360 days    ALT  Date Value Ref Range Status  06/23/2023 21 9 - 46 U/L Final         Passed - Completed PHQ-2 or PHQ-9 in the last 360 days      Passed - Last BP in normal range    BP Readings from Last 1 Encounters:  06/28/23 110/66         Passed - Valid encounter within last 6 months    Recent Outpatient Visits           1 month ago Annual physical exam   [facility] [place], Marsa PARAS, DO   5 months ago Type 2 diabetes mellitus with other specified complication, without long-term current use of insulin [facility])   [place] [facility] [place] Marsa PARAS, DO   8 months ago Atypical chest pain   [place] [facility] [place] Marsa PARAS, DO   10 months ago Type 2 diabetes mellitus with other specified complication, without long-term current use of insulin [facility])   [place] [facility] [place] Marsa PARAS, DO   1 year ago Annual physical exam   [place] [facility] [place] Marsa PARAS, DO       Future Appointments             In 6 months Ed[place], Marsa PARAS, DO  [facility], [facility]

## 2023-07-29 NOTE — Telephone Encounter (Signed)
Appointment scheduled for 09/08/23.

## 2023-09-08 ENCOUNTER — Ambulatory Visit: Payer: Managed Care, Other (non HMO) | Attending: Cardiology | Admitting: Cardiology

## 2023-09-08 ENCOUNTER — Encounter: Payer: Self-pay | Admitting: Cardiology

## 2023-09-08 VITALS — BP 110/58 | HR 88 | Ht 70.0 in | Wt 224.0 lb

## 2023-09-08 DIAGNOSIS — I2510 Atherosclerotic heart disease of native coronary artery without angina pectoris: Secondary | ICD-10-CM

## 2023-09-08 DIAGNOSIS — E782 Mixed hyperlipidemia: Secondary | ICD-10-CM | POA: Diagnosis not present

## 2023-09-08 NOTE — Progress Notes (Signed)
Cardiology Office Note:    Date:  09/08/2023   ID:  Shawn Gordon, DOB [birth year], MRN [id number]  PCP:  Smitty Cords, DO   [facility] Providers Cardiologist:  Debbe Odea, MD     Referring MD: Saralyn Pilar *   Chief Complaint  Patient presents with   Follow-up    Patient denies new or acute cardiac problems/concerns today.     History of Present Illness:    Shawn Gordon is a 63 y.o. male with a hx of diabetes, hyperlipidemia, former smoker x 20 years presenting for follow-up.    Last seen with hyperlipidemia, Lipitor increased to 20 mg daily.  Tolerating medications no adverse effects.  Also states eating healthier, being very active, trying to lose weight.  Denies chest pain or shortness of breath.  Feels well, no concerns today.  Prior notes/testing Echo 01/2023 EF 55 to 60% Coronary CT 02/2023 minimal LAD and left circumflex stenosis, calcium score 45.6.  Past Medical History:  Diagnosis Date   Arthritis    Lower spine, wrist, hips(pre-replacement), shoulders   Asthma    Back pain    Diabetes mellitus, type 2 (HCC)    GERD (gastroesophageal reflux disease)    Presence of dental prosthetic device    2 implants.  1 top, 1 bottom    Past Surgical History:  Procedure Laterality Date   SHOULDER ARTHROSCOPY WITH SUBACROMIAL DECOMPRESSION AND OPEN ROTATOR C Right 05/17/2020   Procedure: Right shoulder arthroscopic subscapularis repair, mini-open supraspinatus repair, subacromial decompression, distal clavicle excision, and open biceps tenodesis;  Surgeon: Signa Kell, MD;  Location: [facility];  Service: Orthopedics;  Laterality: Right;   SHOULDER ARTHROSCOPY WITH SUBACROMIAL DECOMPRESSION, ROTATOR CUFF REPAIR AND BICEP TENDON REPAIR Left 09/01/2019   Procedure: SHOULDER ARTHROSCOPY WITH SUBACROMIAL DECOMPRESSION, ROTATOR CUFF REPAIR AND BICEP TENDON REPAIR, SUBSACAPULAIRS REPAIR, DISTAL CLAVICLE EXCISION;  Surgeon:  Signa Kell, MD;  Location: [facility];  Service: Orthopedics;  Laterality: Left;  Diabetic - oral and injectable meds [name] ASSISTING   TOTAL HIP ARTHROPLASTY Bilateral 2007   Left - 2007, right - 2008    Current Medications: Current Meds  Medication Sig   atorvastatin (LIPITOR) 20 MG tablet TAKE 1 TABLET BY MOUTH EVERYDAY AT BEDTIME--due follow up visit. PLEASE CALL OFFICE TO SCHEDULE APPOINTMENT PRIOR TO NEXT REFILL   B Complex Vitamins (B COMPLEX PO) Take by mouth daily.   brimonidine (ALPHAGAN) 0.2 % ophthalmic solution 1 drop 2 (two) times daily.   buPROPion (WELLBUTRIN XL) 150 MG 24 hr tablet TAKE 1 TABLET (150 MG TOTAL) BY MOUTH DAILY. IN MORNING.   Cholecalciferol (VITAMIN D3 PO) Take by mouth daily.   FLUoxetine (PROZAC) 40 MG capsule TAKE 1 CAPSULE (40 MG TOTAL) BY MOUTH DAILY.   glucose blood (TRUETEST TEST) test strip Check Blood Sugar 1-2 Times daily.   ivabradine (CORLANOR) 5 MG TABS tablet Take by mouth.   lisinopril (ZESTRIL) 2.5 MG tablet TAKE 1 TABLET BY MOUTH EVERY DAY   metFORMIN (GLUCOPHAGE) 1000 MG tablet Take 0.5 tablets (500 mg total) by mouth 2 (two) times daily with a meal.   MOUNJARO 10 MG/0.5ML Pen Inject 10 mg into the skin once a week.   omeprazole (PRILOSEC) 40 MG capsule TAKE 1 CAPSULE (40 MG TOTAL) BY MOUTH 2 (TWO) TIMES DAILY BEFORE A MEAL.   tamsulosin (FLOMAX) 0.4 MG CAPS capsule TAKE 1 CAPSULE (0.4 MG TOTAL) BY MOUTH DAILY AFTER BREAKFAST.     Allergies:   Patient  has no known allergies.   Social History   Socioeconomic History   Marital status: Single    Spouse name: Not on file   Number of children: Not on file   Years of education: Not on file   Highest education level: Not on file  Occupational History   Occupation: Engineer, structural    Comment: Night-shift  Tobacco Use   Smoking status: Former    Current packs/day: 0.00    Average packs/day: 2.0 packs/day for 22.9 years (45.7 ttl pk-yrs)    Types: Cigarettes     Start date: 04/19/1978    Quit date: 02/26/2001    Years since quitting: 22.5   Smokeless tobacco: Former  Building services engineer status: Never Used  Substance and Sexual Activity   Alcohol use: No    Alcohol/week: 0.0 standard drinks of alcohol   Drug use: No   Sexual activity: Not on file  Other Topics Concern   Not on file  Social History Narrative   Not on file   Social Drivers of Health   Financial Resource Strain: Low Risk  (06/28/2023)   Overall Financial Resource Strain (CARDIA)    Difficulty of Paying Living Expenses: Not very hard  Food Insecurity: No Food Insecurity (06/28/2023)   Hunger Vital Sign    Worried About Running Out of Food in the Last Year: Never true    Ran Out of Food in the Last Year: Never true  Transportation Needs: No Transportation Needs (06/28/2023)   PRAPARE - Administrator, Civil Service (Medical): No    Lack of Transportation (Non-Medical): No  Physical Activity: Unknown (06/28/2023)   Exercise Vital Sign    Days of Exercise per Week: 3 days    Minutes of Exercise per Session: Not on file  Stress: Not on file  Social Connections: Moderately Isolated (06/28/2023)   Social Connection and Isolation Panel [NHANES]    Frequency of Communication with Friends and Family: More than three times a week    Frequency of Social Gatherings with Friends and Family: Not on file    Attends Religious Services: 1 to 4 times per year    Active Member of [employer] or Organizations: Not on file    Attends Banker Meetings: Never    Marital Status: Never married     Family History: The patient's family history includes Cancer in his father; Heart disease in his mother; Hypertension in his paternal grandfather; Stroke in his father and mother.  ROS:   Please see the history of present illness.     All other systems reviewed and are negative.  EKGs/Labs/Other Studies Reviewed:          Recent Labs: 06/23/2023: ALT 21; BUN 25; Creat 0.76;  Hemoglobin 14.0; Platelets 305; Potassium 4.6; Sodium 136; TSH 0.86  Recent Lipid Panel    Component Value Date/Time   CHOL 100 06/23/2023 0816   TRIG 66 06/23/2023 0816   HDL 48 06/23/2023 0816   CHOLHDL 2.1 06/23/2023 0816   VLDL 31 (H) 01/14/2016 1647   LDLCALC 37 06/23/2023 0816     Risk Assessment/Calculations:              Physical Exam:    VS:  BP (!) 110/58 (BP Location: Left Arm, Patient Position: Sitting, Cuff Size: Large)   Pulse 88   Ht 5\' 10"  (1.778 m)   Wt 224 lb (101.6 kg)   SpO2 96%   BMI 32.14 kg/m  Wt Readings from Last 3 Encounters:  09/08/23 224 lb (101.6 kg)  06/28/23 217 lb (98.4 kg)  02/25/23 227 lb 3.2 oz (103.1 kg)     GEN:  Well nourished, well developed in no acute distress HEENT: Normal NECK: No JVD; No carotid bruits CARDIAC: RRR, no murmurs, rubs, gallops RESPIRATORY:  Clear to auscultation without rales, wheezing or rhonchi  ABDOMEN: Soft, non-tender, non-distended MUSCULOSKELETAL:  No edema; No deformity  SKIN: Warm and dry NEUROLOGIC:  Alert and oriented x 3 PSYCHIATRIC:  Normal affect   ASSESSMENT:    1. Coronary artery calcification   2. Mixed hyperlipidemia    PLAN:    In order of problems listed above:  Minimal coronary calcifications, coronary CT 8/24 with calcium score 45.6, minimal LAD and left circumflex stenosis<25%.  Echo 7/24 EF 55 to 60%.  Denies chest pain.  Continue Lipitor 20 mg daily. Hyperlipidemia, cholesterol now controlled.  Continue Lipitor 20 mg daily.   Follow-up in 12 months.     Medication Adjustments/Labs and Tests Ordered: Current medicines are reviewed at length with the patient today.  Concerns regarding medicines are outlined above.  Orders Placed This Encounter  Procedures   EKG 12-Lead   No orders of the defined types were placed in this encounter.   Patient Instructions  Medication Instructions:   Your physician recommends that you continue on your current medications as  directed. Please refer to the Current Medication list given to you today.  *If you need a refill on your cardiac medications before your next appointment, please call your pharmacy*   Lab Work:  None Ordered  If you have labs (blood work) drawn today and your tests are completely normal, you will receive your results only by: MyChart Message (if you have MyChart) OR A paper copy in the mail If you have any lab test that is abnormal or we need to change your treatment, we will call you to review the results.   Testing/Procedures:  None Ordered   Follow-Up: At [facility], you and your health needs are our priority.  As part of our continuing mission to provide you with exceptional heart care, we have created designated Provider Care Teams.  These Care Teams include your primary Cardiologist (physician) and Advanced Practice Providers (APPs -  Physician Assistants and Nurse Practitioners) who all work together to provide you with the care you need, when you need it.  We recommend signing up for the patient portal called "MyChart".  Sign up information is provided on this After Visit Summary.  MyChart is used to connect with patients for Virtual Visits (Telemedicine).  Patients are able to view lab/test results, encounter notes, upcoming appointments, etc.  Non-urgent messages can be sent to your provider as well.   To learn more about what you can do with MyChart, go to [email or web address].    Your next appointment:   12 month(s)  Provider:   You may see Debbe Odea, MD or one of the following Advanced Practice Providers on your designated Care Team:   Nicolasa Ducking, NP Eula Listen, PA-C Cadence Fransico Michael, PA-C Charlsie Quest, NP Carlos Levering, NP   Signed, Debbe Odea, MD  09/08/2023 9:29 AM    [facility]

## 2023-09-08 NOTE — Patient Instructions (Signed)
 Medication Instructions:   Your physician recommends that you continue on your current medications as directed. Please refer to the Current Medication list given to you today.  *If you need a refill on your cardiac medications before your next appointment, please call your pharmacy*   Lab Work:  None Ordered  If you have labs (blood work) drawn today and your tests are completely normal, you will receive your results only by: MyChart Message (if you have MyChart) OR A paper copy in the mail If you have any lab test that is abnormal or we need to change your treatment, we will call you to review the results.   Testing/Procedures:  None Ordered    Follow-Up: At [facility], you and your health needs are our priority.  As part of our continuing mission to provide you with exceptional heart care, we have created designated Provider Care Teams.  These Care Teams include your primary Cardiologist (physician) and Advanced Practice Providers (APPs -  Physician Assistants and Nurse Practitioners) who all work together to provide you with the care you need, when you need it.  We recommend signing up for the patient portal called "MyChart".  Sign up information is provided on this After Visit Summary.  MyChart is used to connect with patients for Virtual Visits (Telemedicine).  Patients are able to view lab/test results, encounter notes, upcoming appointments, etc.  Non-urgent messages can be sent to your provider as well.   To learn more about what you can do with MyChart, go to [email or web address].    Your next appointment:   12 month(s)  Provider:   You may see Debbe Odea, MD or one of the following Advanced Practice Providers on your designated Care Team:   Nicolasa Ducking, NP Eula Listen, PA-C Cadence Fransico Michael, PA-C Charlsie Quest, NP Carlos Levering, NP

## 2023-09-20 NOTE — Progress Notes (Addendum)
 PA started   Shawn Gordon (Key: [id number]) Rx #: [id number] Need Help? Call us at [phone] Status New (Not sent to plan) Drug Mounjaro 10MG /0.5ML auto-injectors ePA cloud logo Form [employer] Electronic PA Form [phone] NCPDP) Original Claim Info 75  Approved on March 7 by [employer] 2017 [facility];Review Type:Prior Auth;Coverage Start Date:09/20/2023;Coverage End Date:09/23/2024; Effective Date: 09/19/2023 Authorization Expiration Date: 09/22/2024

## 2023-10-25 ENCOUNTER — Other Ambulatory Visit: Payer: Self-pay | Admitting: Family Medicine

## 2023-10-25 DIAGNOSIS — K219 Gastro-esophageal reflux disease without esophagitis: Secondary | ICD-10-CM

## 2023-10-26 NOTE — Telephone Encounter (Signed)
 Requested Prescriptions  Pending Prescriptions Disp Refills   omeprazole (PRILOSEC) 40 MG capsule [Pharmacy Med Name: OMEPRAZOLE DR 40 MG CAPSULE] 180 capsule 0    Sig: TAKE 1 CAPSULE (40 MG TOTAL) BY MOUTH 2 (TWO) TIMES DAILY BEFORE A MEAL.     Gastroenterology: Proton Pump Inhibitors Failed - 10/26/2023 10:30 AM      Failed - Valid encounter within last 12 months    Recent Outpatient Visits   None     Future Appointments             In 3 months Karamalegos, Netta Neat, DO [facility], [facility]

## 2023-10-27 ENCOUNTER — Other Ambulatory Visit: Payer: Self-pay

## 2023-10-27 DIAGNOSIS — E1169 Type 2 diabetes mellitus with other specified complication: Secondary | ICD-10-CM

## 2023-10-27 MED ORDER — ATORVASTATIN CALCIUM 20 MG PO TABS: ORAL_TABLET | ORAL | 0 refills | Status: DC

## 2024-01-17 ENCOUNTER — Other Ambulatory Visit: Payer: Self-pay | Admitting: Family Medicine

## 2024-01-17 DIAGNOSIS — K219 Gastro-esophageal reflux disease without esophagitis: Secondary | ICD-10-CM

## 2024-01-18 ENCOUNTER — Other Ambulatory Visit: Payer: Self-pay | Admitting: Family Medicine

## 2024-01-18 DIAGNOSIS — E1169 Type 2 diabetes mellitus with other specified complication: Secondary | ICD-10-CM

## 2024-01-18 NOTE — Telephone Encounter (Signed)
 Requested Prescriptions  Pending Prescriptions Disp Refills   omeprazole  (PRILOSEC) 40 MG capsule [Pharmacy Med Name: OMEPRAZOLE  DR 40 MG CAPSULE] 180 capsule 0    Sig: TAKE 1 CAPSULE (40 MG TOTAL) BY MOUTH 2 (TWO) TIMES DAILY BEFORE A MEAL.     Gastroenterology: Proton Pump Inhibitors Failed - 01/18/2024  5:23 PM      Failed - Valid encounter within last 12 months    Recent Outpatient Visits   None     Future Appointments             In 1 week Edman, Marsa PARAS, DO [facility], [facility]

## 2024-01-19 NOTE — Telephone Encounter (Signed)
 Requested medication (s) are due for refill today: na   Requested medication (s) are on the active medication list: yes   Last refill:  06/28/23 #6 ml 1 refills  Future visit scheduled: yes in 1 week   Notes to clinic:  medication not assigned to a protocol. Do you want to refill Rx?     Requested Prescriptions  Pending Prescriptions Disp Refills   MOUNJARO  10 MG/0.5ML Pen [Pharmacy Med Name: MOUNJARO  10 MG/0.5 ML PEN]  1    Sig: INJECT 10 MG INTO THE SKIN ONE TIME PER WEEK     Off-Protocol Failed - 01/19/2024  2:54 PM      Failed - Medication not assigned to a protocol, review manually.      Failed - Valid encounter within last 12 months    Recent Outpatient Visits   None     Future Appointments             In 1 week Karamalegos, Marsa PARAS, DO [facility], [facility]

## 2024-01-19 NOTE — Telephone Encounter (Signed)
 Can you check with patient to see if he is going to run out of Mounjaro  before his apt next week? If he will have enough medicine I would prefer to re order at his appointment after discussing the medicine. If he will run out, we can re order the same as before Mounjaro  90 day  Let me know!  Marsa Officer, DO [facility] 01/19/2024, 3:27 PM

## 2024-01-21 ENCOUNTER — Other Ambulatory Visit: Payer: Self-pay | Admitting: Cardiology

## 2024-01-21 DIAGNOSIS — E1169 Type 2 diabetes mellitus with other specified complication: Secondary | ICD-10-CM

## 2024-01-24 ENCOUNTER — Other Ambulatory Visit: Payer: Self-pay | Admitting: Family Medicine

## 2024-01-24 DIAGNOSIS — E1169 Type 2 diabetes mellitus with other specified complication: Secondary | ICD-10-CM

## 2024-01-25 NOTE — Telephone Encounter (Signed)
 Requested Prescriptions  Pending Prescriptions Disp Refills   lisinopril  (ZESTRIL ) 2.5 MG tablet [Pharmacy Med Name: LISINOPRIL  2.5 MG TABLET] 90 tablet 0    Sig: TAKE 1 TABLET BY MOUTH EVERY DAY     Cardiovascular:  ACE Inhibitors Failed - 01/25/2024  4:00 PM      Failed - Cr in normal range and within 180 days    Creat  Date Value Ref Range Status  06/23/2023 0.76 0.70 - 1.35 mg/dL Final   Creatinine, Urine  Date Value Ref Range Status  06/28/2023 115 20 - 320 mg/dL Final         Failed - K in normal range and within 180 days    Potassium  Date Value Ref Range Status  06/23/2023 4.6 3.5 - 5.3 mmol/L Final         Failed - Valid encounter within last 6 months    Recent Outpatient Visits   None     Future Appointments             In 3 days Edman Marsa PARAS, DO [facility], [facility]            Passed - Patient is not pregnant      Passed - Last BP in normal range    BP Readings from Last 1 Encounters:  09/08/23 (!) 110/58

## 2024-01-28 ENCOUNTER — Ambulatory Visit (INDEPENDENT_AMBULATORY_CARE_PROVIDER_SITE_OTHER): Payer: Self-pay | Admitting: Family Medicine

## 2024-01-28 ENCOUNTER — Encounter: Payer: Self-pay | Admitting: Family Medicine

## 2024-01-28 VITALS — BP 110/58 | HR 90 | Ht 70.0 in | Wt 214.1 lb

## 2024-01-28 DIAGNOSIS — Z7985 Long-term (current) use of injectable non-insulin antidiabetic drugs: Secondary | ICD-10-CM | POA: Diagnosis not present

## 2024-01-28 DIAGNOSIS — L309 Dermatitis, unspecified: Secondary | ICD-10-CM

## 2024-01-28 DIAGNOSIS — K219 Gastro-esophageal reflux disease without esophagitis: Secondary | ICD-10-CM

## 2024-01-28 DIAGNOSIS — E66811 Obesity, class 1: Secondary | ICD-10-CM | POA: Diagnosis not present

## 2024-01-28 DIAGNOSIS — N522 Drug-induced erectile dysfunction: Secondary | ICD-10-CM

## 2024-01-28 DIAGNOSIS — E1169 Type 2 diabetes mellitus with other specified complication: Secondary | ICD-10-CM | POA: Diagnosis not present

## 2024-01-28 LAB — POCT GLYCOSYLATED HEMOGLOBIN (HGB A1C): Hemoglobin A1C: 5.6 % (ref 4.0–5.6)

## 2024-01-28 MED ORDER — MOUNJARO 10 MG/0.5ML ~~LOC~~ SOAJ: 10.0000 mg | SUBCUTANEOUS | 1 refills | Status: DC

## 2024-01-28 MED ORDER — LISINOPRIL 2.5 MG PO TABS
2.5000 mg | ORAL_TABLET | Freq: Every day | ORAL | 3 refills | Status: AC
Start: 2024-01-28 — End: ?

## 2024-01-28 NOTE — Patient Instructions (Addendum)
 Thank you for coming to the office today.  Future Prevnar-20 vaccine 65+  Consider Varicella Zoster Titer Ab in future  Stop Tamsulosin  for now monitor symptoms and follow up if need assistance with ED  Recent Labs    02/16/23 0818 06/23/23 0816 01/28/24 1529  HGBA1C 6.3* 6.6* 5.6     Taper down Metformin   Keep Mounjaro  10mg  weekly 3 month  Maybe heat rash low back  The rash looks most consistent with eczema, this can flare up and get worse due to a variety of factors (excessive dry skin from bathing/showering, soaps, cold weather / indoor heaters, outdoor exposures).  We can try topical steroid cream if interested  Tips to reduce Eczema Flares: For baths/showers, limit bathing to every other day if you can (max 1 x daily)  Use a gentle, unscented soap and lukewarm water (hot water is most irritating to skin) Never scrub skin with too much pressure, this causes more irritation. Pat skin dry, then leave it slightly damp. DO NOT scrub it dry. Apply steroid cream to skin and rub in all the way, wait 15 min, then apply a daily moisturizer (Vaseline, Eucerin, Aveeno). Continue daily moisturizer every day of the year (even after flare is resolved) - If you have eczema on hands or dry hands, recommend wearing any type of gloves overnight (cloth, fabric, or even nitrile/latex) to improve effect of topical moisturizer  If develops redness, honey colored crust oozing, drainage of pus, bleeding, or redness / swelling, pain, please return for re-evaluation, may have become infected after scratching.   DUE for FASTING BLOOD WORK (no food or drink after midnight before the lab appointment, only water or coffee without cream/sugar on the morning of)  SCHEDULE Lab Only visit in the morning at the clinic for lab draw in 5 MONTHS   - Make sure Lab Only appointment is at about 1 week before your next appointment, so that results will be available  For Lab Results, once available within  2-3 days of blood draw, you can can log in to MyChart online to view your results and a brief explanation. Also, we can discuss results at next follow-up visit.    Please schedule a Follow-up Appointment to: Return in about 5 months (around 06/29/2024) for 5 month fasting lab > 1 week later Annual Physical.  If you have any other questions or concerns, please feel free to call the office or send a message through MyChart. You may also schedule an earlier appointment if necessary.  Additionally, you may be receiving a survey about your experience at our office within a few days to 1 week by e-mail or mail. We value your feedback.  Marsa Officer, DO [facility], [place]

## 2024-01-28 NOTE — Progress Notes (Unsigned)
 Subjective:    Patient ID: Shawn Gordon, male    DOB: Jun 08, 1961, 63 y.o.   MRN: [id number]  Shawn Gordon is a 63 y.o. male presenting on 01/28/2024 for Medical Management of Chronic Issues   HPI  Discussed the use of AI scribe software for clinical note transcription with the patient, who gave verbal consent to proceed.  History of Present Illness   Shawn Gordon is a 63 year old male with type 2 diabetes who presents for a six-month follow-up for blood sugar management.  Pruritus and skin findings - Pruritus localized to lower legs and base of spine (tailbone) - Scratching lower legs until bleeding, resulting in superficial scratches and bruising - No visible rash currently - Use of 'mane and tail' hand cream for symptomatic relief, considering use on legs - Frequent outdoor work  Erectile dysfunction and urologic symptoms - Current use of tamsulosin  (Flomax ) for prostate issues, initially prescribed for blood in semen - Onset of impotence since initiation of tamsulosin   CHRONIC DM, Type 2 Obesity BMI >30 Significant improvement on Mounjaro  A1c at 5.6, prior 6.3-6.6 range - Weight loss from 224 lbs to 214 lbs, with interest in further weight reduction  Current medications - Mounjaro  10mg  inj weekly - Metformin  500mg  2x daily He does well with reduced appetite Drinks Ensure regularly to maintain diet Reports good compliance. Tolerating well w/o side-effects Denies hypoglycemia, polyuria, visual changes, numbness.   Low Back, chronic pain Last X-ray 02/2021 fixed position back pain will increase and worsen Sleep on L side and it can bother or flare up L hip Hip and knee improved w weight loss    HYPERLIPIDEMIA: - Reports no concerns. Last lipid panel 06/2023, controlled very well on medicine and wt loss TC 100, LDL 37, TG 66 - Currently taking Atorvastatin  20mg , tolerating well without side effects or myalgias     S/p bilateral cataract and lasik  surgery 04/2023, 05/2023 Using artificial tears     Cologuard 07/2022, negative, next 07/2025   Future Prevnar-20 vaccine age 31+     01/28/2024    3:36 PM 06/28/2023    8:23 AM 02/16/2023    2:59 PM  Depression screen PHQ 2/9  Decreased Interest 1 0 0  Down, Depressed, Hopeless 1 0 0  PHQ - 2 Score 2 0 0  Altered sleeping 1 1 0  Tired, decreased energy 2  1  Change in appetite 0 0 1  Feeling bad or failure about yourself  0 1 0  Trouble concentrating 0 2 0  Moving slowly or fidgety/restless 3 1 0  Suicidal thoughts 0 0 0  PHQ-9 Score 8 5 2   Difficult doing work/chores Somewhat difficult  Not difficult at all       01/28/2024    3:36 PM 06/28/2023    8:23 AM 02/16/2023    2:59 PM 11/12/2022   10:34 AM  GAD 7 : Generalized Anxiety Score  Nervous, Anxious, on Edge 1 1 0 0  Control/stop worrying 0 0 0 0  Worry too much - different things 0 2 0 1  Trouble relaxing 0 1 0 1  Restless 1 0 0 0  Easily annoyed or irritable 2 2 0 1  Afraid - awful might happen 0 0 0 0  Total GAD 7 Score 4 6 0 3  Anxiety Difficulty Not difficult at all  Not difficult at all     Social History   Tobacco Use   Smoking status: Former  Current packs/day: 0.00    Average packs/day: 2.0 packs/day for 22.9 years (45.7 ttl pk-yrs)    Types: Cigarettes    Start date: 04/19/1978    Quit date: 02/26/2001    Years since quitting: 22.9   Smokeless tobacco: Former  Building services engineer status: Never Used  Substance Use Topics   Alcohol use: No    Alcohol/week: 0.0 standard drinks of alcohol   Drug use: No    Review of Systems Per HPI unless specifically indicated above     Objective:    BP (!) 110/58 (BP Location: Left Arm, Patient Position: Sitting, Cuff Size: Normal)   Pulse 90   Ht 5' 10 (1.778 m)   Wt 214 lb 2 oz (97.1 kg)   SpO2 99%   BMI 30.72 kg/m   Wt Readings from Last 3 Encounters:  01/28/24 214 lb 2 oz (97.1 kg)  09/08/23 224 lb (101.6 kg)  06/28/23 217 lb (98.4 kg)     Physical Exam Vitals and nursing note reviewed.  Constitutional:      General: He is not in acute distress.    Appearance: He is well-developed. He is not diaphoretic.     Comments: Well-appearing, comfortable, cooperative  HENT:     Head: Normocephalic and atraumatic.  Eyes:     General:        Right eye: No discharge.        Left eye: No discharge.     Conjunctiva/sclera: Conjunctivae normal.  Neck:     Thyroid: No thyromegaly.  Cardiovascular:     Rate and Rhythm: Normal rate and regular rhythm.     Pulses: Normal pulses.     Heart sounds: Normal heart sounds. No murmur heard. Pulmonary:     Effort: Pulmonary effort is normal. No respiratory distress.     Breath sounds: Normal breath sounds. No wheezing or rales.  Musculoskeletal:        General: Normal range of motion.     Cervical back: Normal range of motion and neck supple.  Lymphadenopathy:     Cervical: No cervical adenopathy.  Skin:    General: Skin is warm and dry.     Findings: No erythema or rash.  Neurological:     Mental Status: He is alert and oriented to person, place, and time. Mental status is at baseline.  Psychiatric:        Behavior: Behavior normal.     Comments: Well groomed, good eye contact, normal speech and thoughts     Recent Labs    02/16/23 0818 06/23/23 0816 01/28/24 1529  HGBA1C 6.3* 6.6* 5.6     Results for orders placed or performed in visit on 01/28/24  POCT HgB A1C   Collection Time: 01/28/24  3:29 PM  Result Value Ref Range   Hemoglobin A1C 5.6 4.0 - 5.6 %   HbA1c POC (<> result, manual entry)     HbA1c, POC (prediabetic range)     HbA1c, POC (controlled diabetic range)        Assessment & Plan:   Problem List Items Addressed This Visit     Erectile dysfunction   GERD (gastroesophageal reflux disease)   Obesity (BMI 30.0-34.9)   Type 2 diabetes mellitus with other specified complication (HCC) - Primary   Relevant Medications   MOUNJARO  10 MG/0.5ML Pen    lisinopril  (ZESTRIL ) 2.5 MG tablet   Other Relevant Orders   POCT HgB A1C (Completed)   Other Visit Diagnoses  Long-term current use of injectable noninsulin antidiabetic medication         Dermatitis            Type 2 Diabetes Mellitus Diabetes well-controlled with A1c at 5.6%. Current regimen effective for A1c and weight management. - Continue Mounjaro  10 mg weekly. - Do not refill metformin ; finish current supply. Taper dose down to 500mg  daily then next visit may discontinue - Monitor blood glucose and A1c.  Hyperlipidemia Occasional non-adherence to nighttime medication. - Continue current cholesterol medication. - Monitor lipid levels.  Gastroesophageal Reflux Disease (GERD) Occasional non-adherence to nighttime medication. Considering reduced frequency of intake. - Continue current acid reflux medication. - Monitor GERD symptoms.  Benign Prostatic Hyperplasia (BPH) Suspected Experiencing impotence with tamsulosin . No urinary flow issues. Trial off medication planned. - Stop tamsulosin  and monitor symptoms. - He will Inform pharmacy to stop refilling tamsulosin . - Consider PDE5 in future  Pruritus of Lower Legs and Tailbone Itching without significant rash, possibly dermatitis or eczema. Daily moisturizer and hydrocortisone recommended. Consider heat rash lower back due to sweating - Use daily moisturizer on affected areas. - Consider over-the-counter hydrocortisone cream if itching persists. Vs Rx Triamcinolone - Monitor symptoms and follow up if no improvement.  General Health Maintenance Discussed vaccinations. Considering pneumonia vaccine at 64, hesitant about shingles vaccine. - Consider Prevnar 20 vaccine at age 22. - Provide informational sheet on shingles vaccine. - Check Varicella zoster ab next time  Follow-up - Schedule follow-up appointment on November 24th. - Schedule lab work for November 17th.        Orders Placed This Encounter   Procedures   POCT HgB A1C    Meds ordered this encounter  Medications   MOUNJARO  10 MG/0.5ML Pen    Sig: Inject 10 mg into the skin once a week.    Dispense:  6 mL    Refill:  1   lisinopril  (ZESTRIL ) 2.5 MG tablet    Sig: Take 1 tablet (2.5 mg total) by mouth daily.    Dispense:  90 tablet    Refill:  3    Add future refills    Follow up plan: Return in about 5 months (around 06/29/2024) for 5 month fasting lab > 1 week later Annual Physical.  Future labs ordered for 06/05/24 all labs + Varicella Zoster Titer Ab   Marsa Officer, DO [facility] 01/28/2024, 3:47 PM

## 2024-01-29 ENCOUNTER — Other Ambulatory Visit: Payer: Self-pay | Admitting: Family Medicine

## 2024-01-29 DIAGNOSIS — E66811 Obesity, class 1: Secondary | ICD-10-CM

## 2024-01-29 DIAGNOSIS — Z20820 Contact with and (suspected) exposure to varicella: Secondary | ICD-10-CM

## 2024-01-29 DIAGNOSIS — N138 Other obstructive and reflux uropathy: Secondary | ICD-10-CM

## 2024-01-29 DIAGNOSIS — Z0000 Encounter for general adult medical examination without abnormal findings: Secondary | ICD-10-CM

## 2024-01-29 DIAGNOSIS — E1169 Type 2 diabetes mellitus with other specified complication: Secondary | ICD-10-CM

## 2024-03-09 LAB — OPHTHALMOLOGY REPORT-SCANNED

## 2024-04-25 ENCOUNTER — Other Ambulatory Visit: Payer: Self-pay | Admitting: Family Medicine

## 2024-04-25 DIAGNOSIS — K219 Gastro-esophageal reflux disease without esophagitis: Secondary | ICD-10-CM

## 2024-04-26 NOTE — Telephone Encounter (Signed)
 Requested Prescriptions  Pending Prescriptions Disp Refills   omeprazole  (PRILOSEC) 40 MG capsule [Pharmacy Med Name: OMEPRAZOLE  DR 40 MG CAPSULE] 180 capsule 0    Sig: TAKE 1 CAPSULE (40 MG TOTAL) BY MOUTH 2 (TWO) TIMES DAILY BEFORE A MEAL.     Gastroenterology: Proton Pump Inhibitors Passed - 04/26/2024  2:11 PM      Passed - Valid encounter within last 12 months    Recent Outpatient Visits           2 months ago Type 2 diabetes mellitus with other specified complication, without long-term current use of insulin [facility])   [facility] [place], Marsa PARAS, [place]

## 2024-06-05 ENCOUNTER — Other Ambulatory Visit

## 2024-06-05 DIAGNOSIS — Z0000 Encounter for general adult medical examination without abnormal findings: Secondary | ICD-10-CM

## 2024-06-05 DIAGNOSIS — Z20820 Contact with and (suspected) exposure to varicella: Secondary | ICD-10-CM

## 2024-06-05 DIAGNOSIS — E66811 Obesity, class 1: Secondary | ICD-10-CM

## 2024-06-05 DIAGNOSIS — E1169 Type 2 diabetes mellitus with other specified complication: Secondary | ICD-10-CM

## 2024-06-05 DIAGNOSIS — N401 Enlarged prostate with lower urinary tract symptoms: Secondary | ICD-10-CM

## 2024-06-07 LAB — MICROALBUMIN / CREATININE URINE RATIO
Creatinine, Urine: 66 mg/dL (ref 20–320)
Microalb, Ur: <0.2 mg/dL

## 2024-06-07 LAB — HEMOGLOBIN A1C
Hgb A1c MFr Bld: 6 % — ABNORMAL HIGH (ref <5.7)
Mean Plasma Glucose: 126 mg/dL
eAG (mmol/L): 7 mmol/L

## 2024-06-07 LAB — CBC WITH DIFFERENTIAL/PLATELET
Absolute Lymphocytes: 1795 {cells}/uL (ref 850–3900)
Absolute Monocytes: 561 {cells}/uL (ref 200–950)
Basophils Absolute: 33 {cells}/uL (ref 0–200)
Basophils Relative: 0.5 %
Eosinophils Absolute: 323 {cells}/uL (ref 15–500)
Eosinophils Relative: 4.9 %
HCT: 39 % (ref 38.5–50.0)
Hemoglobin: 12.8 g/dL — ABNORMAL LOW (ref 13.2–17.1)
MCH: 29.2 pg (ref 27.0–33.0)
MCHC: 32.8 g/dL (ref 32.0–36.0)
MCV: 88.8 fL (ref 80.0–100.0)
MPV: 10.1 fL (ref 7.5–12.5)
Monocytes Relative: 8.5 %
Neutro Abs: 3887 {cells}/uL (ref 1500–7800)
Neutrophils Relative %: 58.9 %
Platelets: 283 Thousand/uL (ref 140–400)
RBC: 4.39 Million/uL (ref 4.20–5.80)
RDW: 12.1 % (ref 11.0–15.0)
Total Lymphocyte: 27.2 %
WBC: 6.6 Thousand/uL (ref 3.8–10.8)

## 2024-06-07 LAB — COMPREHENSIVE METABOLIC PANEL WITH GFR
AG Ratio: 1.4 (calc) (ref 1.0–2.5)
ALT: 13 U/L (ref 9–46)
AST: 13 U/L (ref 10–35)
Albumin: 4.1 g/dL (ref 3.6–5.1)
Alkaline phosphatase (APISO): 69 U/L (ref 35–144)
BUN: 25 mg/dL (ref 7–25)
CO2: 26 mmol/L (ref 20–32)
Calcium: 9.4 mg/dL (ref 8.6–10.3)
Chloride: 102 mmol/L (ref 98–110)
Creat: 0.76 mg/dL (ref 0.70–1.35)
Globulin: 2.9 g/dL (ref 1.9–3.7)
Glucose, Bld: 97 mg/dL (ref 65–99)
Potassium: 4.2 mmol/L (ref 3.5–5.3)
Sodium: 138 mmol/L (ref 135–146)
Total Bilirubin: 0.4 mg/dL (ref 0.2–1.2)
Total Protein: 7 g/dL (ref 6.1–8.1)
eGFR: 100 mL/min/1.73m2 (ref >=60)

## 2024-06-07 LAB — LIPID PANEL
Cholesterol: 85 mg/dL (ref <200)
HDL: 47 mg/dL (ref >=40)
LDL Cholesterol (Calc): 15 mg/dL
Non-HDL Cholesterol (Calc): 38 mg/dL (ref <130)
Total CHOL/HDL Ratio: 1.8 (calc) (ref <5.0)
Triglycerides: 142 mg/dL (ref <150)

## 2024-06-07 LAB — PSA: PSA: 1.65 ng/mL (ref <=4.00)

## 2024-06-07 LAB — VARICELLA ZOSTER ANTIBODY, IGG: Varicella IgG: 21.4 {s_co_ratio}

## 2024-06-07 LAB — TSH: TSH: 1.62 m[IU]/L (ref 0.40–4.50)

## 2024-06-12 ENCOUNTER — Encounter: Payer: Self-pay | Admitting: Family Medicine

## 2024-06-12 ENCOUNTER — Ambulatory Visit: Admitting: Family Medicine

## 2024-06-12 VITALS — BP 108/60 | HR 80 | Ht 70.0 in | Wt 221.1 lb

## 2024-06-12 DIAGNOSIS — E1169 Type 2 diabetes mellitus with other specified complication: Secondary | ICD-10-CM | POA: Diagnosis not present

## 2024-06-12 DIAGNOSIS — N401 Enlarged prostate with lower urinary tract symptoms: Secondary | ICD-10-CM

## 2024-06-12 DIAGNOSIS — Z23 Encounter for immunization: Secondary | ICD-10-CM | POA: Diagnosis not present

## 2024-06-12 DIAGNOSIS — E66811 Obesity, class 1: Secondary | ICD-10-CM | POA: Diagnosis not present

## 2024-06-12 DIAGNOSIS — Z7985 Long-term (current) use of injectable non-insulin antidiabetic drugs: Secondary | ICD-10-CM

## 2024-06-12 DIAGNOSIS — F3342 Major depressive disorder, recurrent, in full remission: Secondary | ICD-10-CM

## 2024-06-12 DIAGNOSIS — K219 Gastro-esophageal reflux disease without esophagitis: Secondary | ICD-10-CM

## 2024-06-12 DIAGNOSIS — N138 Other obstructive and reflux uropathy: Secondary | ICD-10-CM

## 2024-06-12 DIAGNOSIS — Z0000 Encounter for general adult medical examination without abnormal findings: Secondary | ICD-10-CM | POA: Diagnosis not present

## 2024-06-12 DIAGNOSIS — E785 Hyperlipidemia, unspecified: Secondary | ICD-10-CM

## 2024-06-12 NOTE — Progress Notes (Signed)
 Subjective:    Patient ID: Shawn Gordon, male    DOB: 02/20/1961, 63 y.o.   MRN: [id number]  Shawn Gordon is a 63 y.o. male presenting on 06/12/2024 for Annual Exam   HPI  Discussed the use of AI scribe software for clinical note transcription with the patient, who gave verbal consent to proceed.  History of Present Illness   Shawn Gordon is a 63 year old male who presents for an annual physical exam.   Gastroesophageal reflux and bowel habits - Taking omeprazole  in the morning and every other night, gradually reducing dosage - No significant changes in lower bowel symptoms, with changes only related to diet - No new digestive concerns  Venous insufficiency and weight fluctuation - History of varicose veins with sock imprints, less severe than previously - Significant weight loss over the past two years, fluctuating between 40 to 50 pounds - Current weight goal is 175 pounds - Recent weight fluctuations attributed to social gatherings and holiday eating  Immunization status - History of chickenpox and positive antibodies for shingles - Has not yet received the shingles vaccine    Erectile dysfunction and urologic symptoms Tapering off Tamsulosin , may no longer need for Prostate   CHRONIC DM, Type 2 Obesity BMI >31 Significant improvement on Mounjaro  A1c 6.0, previous 5.6 and 6.3-6.6 - Weight stable 221 lbs Current medications - Mounjaro  10mg  inj weekly - Taking lisinopril  2.5 mg for renal protection - Remains OFF Metformin  He does well with reduced appetite Drinks Ensure regularly to maintain diet Reports good compliance. Tolerating well w/o side-effects Denies hypoglycemia, polyuria, visual changes, numbness.   Low Back, chronic pain Last X-ray 02/2021 fixed position back pain will increase and worsen Sleep on L side and it can bother or flare up L hip Hip and knee improved w weight loss    HYPERLIPIDEMIA: - Reports no concerns. Last lipid panel  15, controlled very well on medicine and wt loss - Currently taking Atorvastatin  20mg , tolerating well without side effects or myalgias  Mild Low Hemoglobin Hgb 12.8, stable from previous range 12-14 No anemia concerns   S/p bilateral cataract and lasik surgery 04/2023, 05/2023 Using artificial tears    Health Maintenance  PSA 1.65, previous 1.01 to 1.16 range.  Decline Flu Shot today   Cologuard 07/2022, negative, next 07/2025   Future Prevnar-20 vaccine age 62+       06/12/2024    9:13 AM 01/28/2024    3:36 PM 06/28/2023    8:23 AM  Depression screen PHQ 2/9  Decreased Interest 2 1 0  Down, Depressed, Hopeless 1 1 0  PHQ - 2 Score 3 2 0  Altered sleeping 0 1 1  Tired, decreased energy 2 2   Change in appetite 0 0 0  Feeling bad or failure about yourself  1 0 1  Trouble concentrating 1 0 2  Moving slowly or fidgety/restless 0 3 1  Suicidal thoughts 0 0 0  PHQ-9 Score 7 8  5    Difficult doing work/chores Somewhat difficult Somewhat difficult      Data saved with a previous flowsheet row definition       06/12/2024    9:14 AM 01/28/2024    3:36 PM 06/28/2023    8:23 AM 02/16/2023    2:59 PM  GAD 7 : Generalized Anxiety Score  Nervous, Anxious, on Edge 3 1 1  0  Control/stop worrying 1 0 0 0  Worry too much - different things 0 0  2 0  Trouble relaxing 1 0 1 0  Restless 1 1 0 0  Easily annoyed or irritable 1 2 2  0  Afraid - awful might happen 0 0 0 0  Total GAD 7 Score 7 4 6  0  Anxiety Difficulty Somewhat difficult Not difficult at all  Not difficult at all     Past Medical History:  Diagnosis Date   Arthritis    Lower spine, wrist, hips(pre-replacement), shoulders   Asthma    Back pain    Diabetes mellitus, type 2 (HCC)    GERD (gastroesophageal reflux disease)    Presence of dental prosthetic device    2 implants.  1 top, 1 bottom   Past Surgical History:  Procedure Laterality Date   SHOULDER ARTHROSCOPY WITH SUBACROMIAL DECOMPRESSION AND OPEN  ROTATOR C Right 05/17/2020   Procedure: Right shoulder arthroscopic subscapularis repair, mini-open supraspinatus repair, subacromial decompression, distal clavicle excision, and open biceps tenodesis;  Surgeon: Tobie Priest, MD;  Location: [facility];  Service: Orthopedics;  Laterality: Right;   SHOULDER ARTHROSCOPY WITH SUBACROMIAL DECOMPRESSION, ROTATOR CUFF REPAIR AND BICEP TENDON REPAIR Left 09/01/2019   Procedure: SHOULDER ARTHROSCOPY WITH SUBACROMIAL DECOMPRESSION, ROTATOR CUFF REPAIR AND BICEP TENDON REPAIR, SUBSACAPULAIRS REPAIR, DISTAL CLAVICLE EXCISION;  Surgeon: Tobie Priest, MD;  Location: [facility];  Service: Orthopedics;  Laterality: Left;  Diabetic - oral and injectable meds [name] ASSISTING   TOTAL HIP ARTHROPLASTY Bilateral 2007   Left - 2007, right - 2008   Social History   Socioeconomic History   Marital status: Single    Spouse name: Not on file   Number of children: Not on file   Years of education: Not on file   Highest education level: Not on file  Occupational History   Occupation: Engineer, Structural    Comment: Night-shift  Tobacco Use   Smoking status: Former    Current packs/day: 0.00    Average packs/day: 2.0 packs/day for 22.9 years (45.7 ttl pk-yrs)    Types: Cigarettes    Start date: 04/19/1978    Quit date: 02/26/2001    Years since quitting: 23.3   Smokeless tobacco: Former  Building Services Engineer status: Never Used  Substance and Sexual Activity   Alcohol use: No    Alcohol/week: 0.0 standard drinks of alcohol   Drug use: No   Sexual activity: Not on file  Other Topics Concern   Not on file  Social History Narrative   Not on file   Social Drivers of Health   Financial Resource Strain: Low Risk  (06/28/2023)   Overall Financial Resource Strain (CARDIA)    Difficulty of Paying Living Expenses: Not very hard  Food Insecurity: No Food Insecurity (06/28/2023)   Hunger Vital Sign    Worried About Running Out of Food in the  Last Year: Never true    Ran Out of Food in the Last Year: Never true  Transportation Needs: No Transportation Needs (06/28/2023)   PRAPARE - Administrator, Civil Service (Medical): No    Lack of Transportation (Non-Medical): No  Physical Activity: Unknown (06/28/2023)   Exercise Vital Sign    Days of Exercise per Week: 3 days    Minutes of Exercise per Session: Not on file  Stress: Not on file  Social Connections: Moderately Isolated (06/28/2023)   Social Connection and Isolation Panel    Frequency of Communication with Friends and Family: More than three times a week    Frequency of Social  Gatherings with Friends and Family: Not on file    Attends Religious Services: 1 to 4 times per year    Active Member of [employer] or Organizations: Not on file    Attends Banker Meetings: Never    Marital Status: Never married  Intimate Partner Violence: Unknown (06/28/2023)   Humiliation, Afraid, Rape, and Kick questionnaire    Fear of Current or Ex-Partner: No    Emotionally Abused: No    Physically Abused: No    Sexually Abused: Not on file   Family History  Problem Relation Age of Onset   Heart disease Mother    Stroke Mother    Stroke Father    Cancer Father    Hypertension Paternal Grandfather    Current Outpatient Medications on File Prior to Visit  Medication Sig   atorvastatin  (LIPITOR) 20 MG tablet Take 1 tablet (20 mg total) by mouth at bedtime.   B Complex Vitamins (B COMPLEX PO) Take by mouth daily.   brimonidine (ALPHAGAN) 0.2 % ophthalmic solution 1 drop 2 (two) times daily.   buPROPion  (WELLBUTRIN  XL) 150 MG 24 hr tablet TAKE 1 TABLET (150 MG TOTAL) BY MOUTH DAILY. IN MORNING.   Cholecalciferol (VITAMIN D3 PO) Take by mouth daily.   FLUoxetine  (PROZAC ) 40 MG capsule TAKE 1 CAPSULE (40 MG TOTAL) BY MOUTH DAILY.   glucose blood (TRUETEST TEST) test strip Check Blood Sugar 1-2 Times daily.   ivabradine  (CORLANOR) 5 MG TABS tablet Take by mouth.    lisinopril  (ZESTRIL ) 2.5 MG tablet Take 1 tablet (2.5 mg total) by mouth daily.   MOUNJARO  10 MG/0.5ML Pen Inject 10 mg into the skin once a week.   omeprazole  (PRILOSEC) 40 MG capsule Take 1 in AM every day and 1 in PM every other day.   No current facility-administered medications on file prior to visit.    Review of Systems  Constitutional:  Negative for activity change, appetite change, chills, diaphoresis, fatigue and fever.  HENT:  Negative for congestion and hearing loss.   Eyes:  Negative for visual disturbance.  Respiratory:  Negative for cough, chest tightness, shortness of breath and wheezing.   Cardiovascular:  Negative for chest pain, palpitations and leg swelling.  Gastrointestinal:  Negative for abdominal pain, constipation, diarrhea, nausea and vomiting.  Genitourinary:  Negative for dysuria, frequency and hematuria.  Musculoskeletal:  Negative for arthralgias and neck pain.  Skin:  Negative for rash.  Neurological:  Negative for dizziness, weakness, light-headedness, numbness and headaches.  Hematological:  Negative for adenopathy.  Psychiatric/Behavioral:  Negative for behavioral problems, dysphoric mood and sleep disturbance.    Per HPI unless specifically indicated above     Objective:    BP 108/60 (BP Location: Right Arm, Patient Position: Sitting, Cuff Size: Normal)   Pulse 80   Ht 5' 10 (1.778 m)   Wt 221 lb 2 oz (100.3 kg)   SpO2 96%   BMI 31.73 kg/m   Wt Readings from Last 3 Encounters:  06/12/24 221 lb 2 oz (100.3 kg)  01/28/24 214 lb 2 oz (97.1 kg)  09/08/23 224 lb (101.6 kg)    Physical Exam Vitals and nursing note reviewed.  Constitutional:      General: He is not in acute distress.    Appearance: He is well-developed. He is not diaphoretic.     Comments: Well-appearing, comfortable, cooperative  HENT:     Head: Normocephalic and atraumatic.  Eyes:     General:  Right eye: No discharge.        Left eye: No discharge.      Conjunctiva/sclera: Conjunctivae normal.     Pupils: Pupils are equal, round, and reactive to light.  Neck:     Thyroid: No thyromegaly.     Vascular: No carotid bruit.  Cardiovascular:     Rate and Rhythm: Normal rate and regular rhythm.     Pulses: Normal pulses.     Heart sounds: Normal heart sounds. No murmur heard. Pulmonary:     Effort: Pulmonary effort is normal. No respiratory distress.     Breath sounds: Normal breath sounds. No wheezing or rales.  Abdominal:     General: Bowel sounds are normal. There is no distension.     Palpations: Abdomen is soft. There is no mass.     Tenderness: There is no abdominal tenderness.  Musculoskeletal:        General: No tenderness. Normal range of motion.     Cervical back: Normal range of motion and neck supple.     Right lower leg: No edema.     Left lower leg: No edema.     Comments: Upper / Lower Extremities: - Normal muscle tone, strength bilateral upper extremities 5/5, lower extremities 5/5  Lymphadenopathy:     Cervical: No cervical adenopathy.  Skin:    General: Skin is warm and dry.     Findings: No erythema or rash.  Neurological:     Mental Status: He is alert and oriented to person, place, and time.     Comments: Distal sensation intact to light touch all extremities  Psychiatric:        Mood and Affect: Mood normal.        Behavior: Behavior normal.        Thought Content: Thought content normal.     Comments: Well groomed, good eye contact, normal speech and thoughts     Diabetic Foot Exam - Simple   Simple Foot Form Diabetic Foot exam was performed with the following findings: Yes 06/12/2024 11:56 AM  Visual Inspection See comments: Yes Sensation Testing Intact to touch and monofilament testing bilaterally: Yes Pulse Check Posterior Tibialis and Dorsalis pulse intact bilaterally: Yes Comments Bilateral high arches, some slight discoloration of nails, some varicose veins, callus formation, bunions       Recent Labs    06/23/23 0816 01/28/24 1529 06/05/24 0834  HGBA1C 6.6* 5.6 6.0*    Results for orders placed or performed in visit on 06/05/24  Varicella zoster antibody, IgG   Collection Time: 06/05/24  8:34 AM  Result Value Ref Range   Varicella IgG 21.40 S/CO  Comprehensive metabolic panel with GFR   Collection Time: 06/05/24  8:34 AM  Result Value Ref Range   Glucose, Bld 97 65 - 99 mg/dL   BUN 25 7 - 25 mg/dL   Creat 9.23 9.29 - 8.64 mg/dL   eGFR 899 > OR = 60 fO/fpw/8.26f7   BUN/Creatinine Ratio SEE NOTE: 6 - 22 (calc)   Sodium 138 135 - 146 mmol/L   Potassium 4.2 3.5 - 5.3 mmol/L   Chloride 102 98 - 110 mmol/L   CO2 26 20 - 32 mmol/L   Calcium  9.4 8.6 - 10.3 mg/dL   Total Protein 7.0 6.1 - 8.1 g/dL   Albumin 4.1 3.6 - 5.1 g/dL   Globulin 2.9 1.9 - 3.7 g/dL (calc)   AG Ratio 1.4 1.0 - 2.5 (calc)   Total Bilirubin 0.4 0.2 -  1.2 mg/dL   Alkaline phosphatase (APISO) 69 35 - 144 U/L   AST 13 10 - 35 U/L   ALT 13 9 - 46 U/L  TSH   Collection Time: 06/05/24  8:34 AM  Result Value Ref Range   TSH 1.62 0.40 - 4.50 mIU/L  Microalbumin / creatinine urine ratio   Collection Time: 06/05/24  8:34 AM  Result Value Ref Range   Creatinine, Urine 66 20 - 320 mg/dL   Microalb, Ur <9.7 mg/dL   Microalb Creat Ratio NOTE <30 mg/g creat  PSA   Collection Time: 06/05/24  8:34 AM  Result Value Ref Range   PSA 1.65 < OR = 4.00 ng/mL  CBC with Differential/Platelet   Collection Time: 06/05/24  8:34 AM  Result Value Ref Range   WBC 6.6 3.8 - 10.8 Thousand/uL   RBC 4.39 4.20 - 5.80 Million/uL   Hemoglobin 12.8 (L) 13.2 - 17.1 g/dL   HCT 60.9 61.4 - 49.9 %   MCV 88.8 80.0 - 100.0 fL   MCH 29.2 27.0 - 33.0 pg   MCHC 32.8 32.0 - 36.0 g/dL   RDW 87.8 88.9 - 84.9 %   Platelets 283 140 - 400 Thousand/uL   MPV 10.1 7.5 - 12.5 fL   Neutro Abs 3,887 1,500 - 7,800 cells/uL   Absolute Lymphocytes 1,795 850 - 3,900 cells/uL   Absolute Monocytes 561 200 - 950 cells/uL    Eosinophils Absolute 323 15 - 500 cells/uL   Basophils Absolute 33 0 - 200 cells/uL   Neutrophils Relative % 58.9 %   Total Lymphocyte 27.2 %   Monocytes Relative 8.5 %   Eosinophils Relative 4.9 %   Basophils Relative 0.5 %  Hemoglobin A1c   Collection Time: 06/05/24  8:34 AM  Result Value Ref Range   Hgb A1c MFr Bld 6.0 (H) <5.7 %   Mean Plasma Glucose 126 mg/dL   eAG (mmol/L) 7.0 mmol/L  Lipid panel   Collection Time: 06/05/24  8:34 AM  Result Value Ref Range   Cholesterol 85 <200 mg/dL   HDL 47 > OR = 40 mg/dL   Triglycerides 857 <849 mg/dL   LDL Cholesterol (Calc) 15 mg/dL (calc)   Total CHOL/HDL Ratio 1.8 <5.0 (calc)   Non-HDL Cholesterol (Calc) 38 <869 mg/dL (calc)      Assessment & Plan:   Problem List Items Addressed This Visit     BPH with obstruction/lower urinary tract symptoms   GERD (gastroesophageal reflux disease)   Relevant Medications   omeprazole  (PRILOSEC) 40 MG capsule   Hyperlipidemia associated with type 2 diabetes mellitus (HCC)   Major depressive disorder, recurrent, in full remission   Obesity (BMI 30.0-34.9)   Type 2 diabetes mellitus with other specified complication (HCC)   Other Visit Diagnoses       Annual physical exam    -  Primary     Need for Streptococcus pneumoniae vaccination       Relevant Orders   Pneumococcal conjugate vaccine 20-valent (Completed)     Long-term current use of injectable noninsulin antidiabetic medication            Updated Health Maintenance information Reviewed recent lab results with patient Encouraged improvement to lifestyle with diet and exercise Goal of weight loss   Adult Wellness Visit Annual wellness visit conducted. Blood pressure, weight, lipid panel, A1c, PSA, thyroid, kidney, and liver function tests are within normal limits. Shingles antibodies present. - Administered Prevnar 20 vaccine for pneumonia. Age 39+ good  for 5+ years - Continue current lifestyle modifications for weight  management. - Continue current medication regimen for cholesterol and diabetes management. - Monitor PSA levels annually. - Monitor thyroid, kidney, and liver function tests annually. - Monitor urine tests annually for kidney function. - Plan for shingles vaccine after 64th birthday. At pharmacy  Type 2 diabetes mellitus Well-controlled with A1c at 6.0%. Mounjaro  10 mg effective. Metformin  being tapered off. - Continue Mounjaro  10 mg weekly - Taper off Metformin  as planned. - Monitor blood glucose levels regularly. - Scheduled follow-up for A1c check in six months.  Obesity, class 1 BMI >31 Weight stable with recent fluctuations. Goal weight 175 lbs. Lifestyle modifications in place. - Continue current dietary and exercise regimen. - Monitor weight regularly.  Major Depression recurrent in remission Controlled on Wellbutrin  XL 150mg  daily  Benign prostatic hyperplasia with lower urinary tract symptoms Symptoms well-managed and now tapering off Tamsulosin  - Continue tapering off tamsulosin  as planned.  Gastroesophageal reflux disease Well-controlled with omeprazole . Custom regimen 40mg  in AM daily and 40mg  in PM every other day - Continue current omeprazole  regimen. - Monitor for any changes in symptoms.  Mildly low hemoglobin (isolated) Hemoglobin slightly low at 12.8 g/dL, stable, no significant concern. - Continue to monitor hemoglobin levels regularly.        Orders Placed This Encounter  Procedures   Pneumococcal conjugate vaccine 20-valent    No orders of the defined types were placed in this encounter.    Follow up plan: Return for 6 month DM A1c.  Marsa Officer, DO [facility] 06/12/2024, 9:19 AM

## 2024-06-12 NOTE — Patient Instructions (Addendum)
 Thank you for coming to the office today.  Cholesterol very well controlled, on Atorvastatin   LDL 15 very low result.  Check in with Cardiologist 08/2024  Next Cologuard 07/2025  Prevnar vaccine today good for 5 years.  Future Shingles vaccine x 2 doses, 2-6 months apart at the pharmacy  Phase off Metformin   Recent Labs    06/23/23 0816 01/28/24 1529 06/05/24 0834  HGBA1C 6.6* 5.6 6.0*   No change mounjaro   Phase off tamsulosin  for prostate.  Agreed with Omeprazole  1 in AM every day and 1 in PM every other day  Please schedule a Follow-up Appointment to: Return for 6 month DM A1c.  If you have any other questions or concerns, please feel free to call the office or send a message through MyChart. You may also schedule an earlier appointment if necessary.  Additionally, you may be receiving a survey about your experience at our office within a few days to 1 week by e-mail or mail. We value your feedback.  Marsa Officer, DO [facility], [place]

## 2024-07-23 ENCOUNTER — Other Ambulatory Visit: Payer: Self-pay | Admitting: Family Medicine

## 2024-07-23 DIAGNOSIS — K219 Gastro-esophageal reflux disease without esophagitis: Secondary | ICD-10-CM

## 2024-07-25 NOTE — Telephone Encounter (Signed)
 Requested medication (s) are due for refill today: yes  Requested medication (s) are on the active medication list: yes  Last refill:  06/12/24  Future visit scheduled: {Yes  Notes to clinic:  historical medication     Requested Prescriptions  Pending Prescriptions Disp Refills   omeprazole  (PRILOSEC) 40 MG capsule [Pharmacy Med Name: OMEPRAZOLE  DR 40 MG CAPSULE] 180 capsule     Sig: TAKE 1 CAPSULE (40 MG TOTAL) BY MOUTH 2 (TWO) TIMES DAILY BEFORE A MEAL.     Gastroenterology: Proton Pump Inhibitors Passed - 07/25/2024 11:31 AM      Passed - Valid encounter within last 12 months    Recent Outpatient Visits           1 month ago Annual physical exam   [place] [facility] [place], Marsa PARAS, DO   5 months ago Type 2 diabetes mellitus with other specified complication, without long-term current use of insulin [facility])   [place] [facility] [place], Marsa PARAS, [place]

## 2024-07-26 ENCOUNTER — Other Ambulatory Visit: Payer: Self-pay | Admitting: Family Medicine

## 2024-07-26 DIAGNOSIS — E1169 Type 2 diabetes mellitus with other specified complication: Secondary | ICD-10-CM

## 2024-07-27 ENCOUNTER — Other Ambulatory Visit: Payer: Self-pay | Admitting: Family Medicine

## 2024-07-27 DIAGNOSIS — E1169 Type 2 diabetes mellitus with other specified complication: Secondary | ICD-10-CM

## 2024-07-27 DIAGNOSIS — F3342 Major depressive disorder, recurrent, in full remission: Secondary | ICD-10-CM

## 2024-07-27 NOTE — Telephone Encounter (Signed)
 Requested medication (s) are due for refill today: yes  Requested medication (s) are on the active medication list: yes  Last refill:  01/28/24  Future visit scheduled: yes  Notes to clinic:   Medication not assigned to a protocol, review manually.     Requested Prescriptions  Pending Prescriptions Disp Refills   MOUNJARO  10 MG/0.5ML Pen [Pharmacy Med Name: MOUNJARO  10 MG/0.5 ML PEN]  1    Sig: INJECT 10 MG INTO THE SKIN ONE TIME PER WEEK     Off-Protocol Failed - 07/27/2024 11:57 AM      Failed - Medication not assigned to a protocol, review manually.      Passed - Valid encounter within last 12 months    Recent Outpatient Visits           1 month ago Annual physical exam   [place] [facility] [place], Marsa PARAS, DO   6 months ago Type 2 diabetes mellitus with other specified complication, without long-term current use of insulin [facility])   [place] [facility] [place], Marsa PARAS, [place]

## 2024-07-27 NOTE — Telephone Encounter (Signed)
 Requested Prescriptions  Pending Prescriptions Disp Refills   FLUoxetine  (PROZAC ) 40 MG capsule [Pharmacy Med Name: FLUOXETINE  HCL 40 MG CAPSULE] 90 capsule 1    Sig: TAKE 1 CAPSULE (40 MG TOTAL) BY MOUTH DAILY.     Psychiatry:  Antidepressants - SSRI Passed - 07/27/2024  3:34 PM      Passed - Completed PHQ-2 or PHQ-9 in the last 360 days      Passed - Valid encounter within last 6 months    Recent Outpatient Visits           1 month ago Annual physical exam   [place] [facility], Marsa PARAS, DO   6 months ago Type 2 diabetes mellitus with other specified complication, without long-term current use of insulin [facility])   [place] [facility], Marsa PARAS, DO               buPROPion  (WELLBUTRIN  XL) 150 MG 24 hr tablet [Pharmacy Med Name: BUPROPION  HCL XL 150 MG TABLET] 90 tablet 1    Sig: TAKE 1 TABLET (150 MG TOTAL) BY MOUTH DAILY. IN MORNING.     Psychiatry: Antidepressants - bupropion  Passed - 07/27/2024  3:34 PM      Passed - Cr in normal range and within 360 days    Creat  Date Value Ref Range Status  06/05/2024 0.76 0.70 - 1.35 mg/dL Final   Creatinine, Urine  Date Value Ref Range Status  06/05/2024 66 20 - 320 mg/dL Final         Passed - AST in normal range and within 360 days    AST  Date Value Ref Range Status  06/05/2024 13 10 - 35 U/L Final         Passed - ALT in normal range and within 360 days    ALT  Date Value Ref Range Status  06/05/2024 13 9 - 46 U/L Final         Passed - Completed PHQ-2 or PHQ-9 in the last 360 days      Passed - Last BP in normal range    BP Readings from Last 1 Encounters:  06/12/24 108/60         Passed - Valid encounter within last 6 months    Recent Outpatient Visits           1 month ago Annual physical exam   [facility] [place], Marsa PARAS, DO   6 months ago Type 2 diabetes mellitus with other specified complication,  without long-term current use of insulin [facility])   [place] [facility] [place], Marsa PARAS, [place]

## 2024-07-28 NOTE — Telephone Encounter (Signed)
 Requested medication (s) are due for refill today: Pharmacy comment: Script Clarification:PLEASE RESEND RX AS 2 ML PER 1 BOX DISPENSING. THANKS.   Requested medication (s) are on the active medication list: yes  Last refill:  07/27/24  Future visit scheduled: yes  Notes to clinic:  Pharmacy comment: Script Clarification:PLEASE RESEND RX AS 2 ML PER 1 BOX DISPENSING. THANKS.      Requested Prescriptions  Pending Prescriptions Disp Refills   MOUNJARO  10 MG/0.5ML Pen [Pharmacy Med Name: MOUNJARO  10 MG/0.5 ML PEN]  1    Sig: INJECT 10 MG INTO THE SKIN ONE TIME PER WEEK     Off-Protocol Failed - 07/28/2024 11:13 AM      Failed - Medication not assigned to a protocol, review manually.      Passed - Valid encounter within last 12 months    Recent Outpatient Visits           1 month ago Annual physical exam   [place] [facility] [place], Marsa PARAS, DO   6 months ago Type 2 diabetes mellitus with other specified complication, without long-term current use of insulin [facility])   [place] [facility] [place], Marsa PARAS, [place]

## 2024-07-31 ENCOUNTER — Other Ambulatory Visit: Payer: Self-pay | Admitting: Family Medicine

## 2024-07-31 DIAGNOSIS — E1169 Type 2 diabetes mellitus with other specified complication: Secondary | ICD-10-CM

## 2024-08-01 NOTE — Telephone Encounter (Signed)
 Requested medication (s) are due for refill today: yes  Requested medication (s) are on the active medication list: yes  Last refill:  07/28/24  Future visit scheduled: yes  Notes to clinic:  Pharmacy comment: Alternative Requested:PLEASE SEND RX AS 6 ML = 3 MONTH SUPPLY PER REQUEST OF PT. THANKS.      Requested Prescriptions  Pending Prescriptions Disp Refills   MOUNJARO  10 MG/0.5ML Pen [Pharmacy Med Name: MOUNJARO  10 MG/0.5 ML PEN]  2    Sig: INJECT 10 MG INTO THE SKIN ONE TIME PER WEEK     Off-Protocol Failed - 08/01/2024  3:45 PM      Failed - Medication not assigned to a protocol, review manually.      Passed - Valid encounter within last 12 months    Recent Outpatient Visits           1 month ago Annual physical exam   [place] [facility] [place], Marsa PARAS, DO   6 months ago Type 2 diabetes mellitus with other specified complication, without long-term current use of insulin [facility])   [place] [facility] [place], Marsa PARAS, [place]

## 2024-12-15 ENCOUNTER — Ambulatory Visit: Admitting: Family Medicine
